# Patient Record
Sex: Female | Born: 1950 | Race: White | Hispanic: No | Marital: Married | Smoking: Former smoker
Health system: Southern US, Community
[De-identification: ages and names within clinical notes are randomized; demographics above are authoritative.]

## PROBLEM LIST (undated history)

## (undated) DIAGNOSIS — I340 Nonrheumatic mitral (valve) insufficiency: Secondary | ICD-10-CM

## (undated) DIAGNOSIS — Z5189 Encounter for other specified aftercare: Secondary | ICD-10-CM

## (undated) DIAGNOSIS — C801 Malignant (primary) neoplasm, unspecified: Secondary | ICD-10-CM

## (undated) DIAGNOSIS — F419 Anxiety disorder, unspecified: Secondary | ICD-10-CM

## (undated) DIAGNOSIS — I071 Rheumatic tricuspid insufficiency: Secondary | ICD-10-CM

## (undated) DIAGNOSIS — E785 Hyperlipidemia, unspecified: Secondary | ICD-10-CM

## (undated) DIAGNOSIS — F32A Depression, unspecified: Secondary | ICD-10-CM

## (undated) DIAGNOSIS — K219 Gastro-esophageal reflux disease without esophagitis: Secondary | ICD-10-CM

## (undated) DIAGNOSIS — E079 Disorder of thyroid, unspecified: Secondary | ICD-10-CM

## (undated) DIAGNOSIS — M1990 Unspecified osteoarthritis, unspecified site: Secondary | ICD-10-CM

## (undated) DIAGNOSIS — F329 Major depressive disorder, single episode, unspecified: Secondary | ICD-10-CM

## (undated) DIAGNOSIS — I10 Essential (primary) hypertension: Secondary | ICD-10-CM

## (undated) HISTORY — DX: Essential (primary) hypertension: I10

## (undated) HISTORY — PX: GALLBLADDER SURGERY: SHX652

## (undated) HISTORY — DX: Disorder of thyroid, unspecified: E07.9

## (undated) HISTORY — PX: CHOLECYSTECTOMY: SHX55

## (undated) HISTORY — PX: TIBIA FRACTURE SURGERY: SHX806

## (undated) HISTORY — DX: Unspecified osteoarthritis, unspecified site: M19.90

## (undated) HISTORY — PX: THROAT SURGERY: SHX803

## (undated) HISTORY — DX: Encounter for other specified aftercare: Z51.89

## (undated) HISTORY — PX: FRACTURE SURGERY: SHX138

## (undated) HISTORY — DX: Anxiety disorder, unspecified: F41.9

## (undated) HISTORY — PX: WRIST SURGERY: SHX841

## (undated) HISTORY — PX: SMALL INTESTINE SURGERY: SHX150

## (undated) HISTORY — DX: Major depressive disorder, single episode, unspecified: F32.9

## (undated) HISTORY — DX: Hyperlipidemia, unspecified: E78.5

## (undated) HISTORY — DX: Depression, unspecified: F32.A

---

## 2004-11-29 ENCOUNTER — Ambulatory Visit: Payer: Self-pay

## 2005-08-16 ENCOUNTER — Ambulatory Visit: Payer: Self-pay | Admitting: Family Medicine

## 2007-02-14 ENCOUNTER — Ambulatory Visit: Payer: Self-pay | Admitting: Family Medicine

## 2007-10-11 ENCOUNTER — Ambulatory Visit: Payer: Self-pay | Admitting: Family Medicine

## 2007-10-27 ENCOUNTER — Ambulatory Visit: Payer: Self-pay | Admitting: Internal Medicine

## 2010-01-04 ENCOUNTER — Ambulatory Visit: Payer: Self-pay | Admitting: Family Medicine

## 2012-04-26 ENCOUNTER — Ambulatory Visit: Payer: Self-pay | Admitting: Family Medicine

## 2012-07-21 ENCOUNTER — Ambulatory Visit: Payer: Self-pay | Admitting: Internal Medicine

## 2012-12-27 ENCOUNTER — Ambulatory Visit: Payer: Self-pay | Admitting: Family Medicine

## 2013-05-08 ENCOUNTER — Ambulatory Visit: Payer: Self-pay | Admitting: Family Medicine

## 2014-10-12 HISTORY — PX: COLONOSCOPY: SHX174

## 2014-10-23 ENCOUNTER — Ambulatory Visit: Payer: Self-pay | Admitting: Physician Assistant

## 2014-10-23 LAB — RAPID STREP-A WITH REFLX: MICRO TEXT REPORT: POSITIVE

## 2014-12-22 DIAGNOSIS — R000 Tachycardia, unspecified: Secondary | ICD-10-CM | POA: Insufficient documentation

## 2014-12-22 DIAGNOSIS — E669 Obesity, unspecified: Secondary | ICD-10-CM | POA: Insufficient documentation

## 2014-12-22 DIAGNOSIS — R5381 Other malaise: Secondary | ICD-10-CM | POA: Insufficient documentation

## 2014-12-22 DIAGNOSIS — E039 Hypothyroidism, unspecified: Secondary | ICD-10-CM | POA: Insufficient documentation

## 2014-12-22 DIAGNOSIS — E7849 Other hyperlipidemia: Secondary | ICD-10-CM | POA: Insufficient documentation

## 2014-12-22 DIAGNOSIS — K219 Gastro-esophageal reflux disease without esophagitis: Secondary | ICD-10-CM | POA: Insufficient documentation

## 2014-12-23 DIAGNOSIS — E782 Mixed hyperlipidemia: Secondary | ICD-10-CM | POA: Insufficient documentation

## 2014-12-23 DIAGNOSIS — I471 Supraventricular tachycardia: Secondary | ICD-10-CM | POA: Insufficient documentation

## 2015-02-18 ENCOUNTER — Ambulatory Visit: Payer: Self-pay | Admitting: Family Medicine

## 2015-03-06 ENCOUNTER — Ambulatory Visit: Payer: Self-pay | Admitting: Gastroenterology

## 2015-06-06 ENCOUNTER — Other Ambulatory Visit: Payer: Self-pay | Admitting: Family Medicine

## 2015-06-06 DIAGNOSIS — F32A Depression, unspecified: Secondary | ICD-10-CM

## 2015-06-06 DIAGNOSIS — E785 Hyperlipidemia, unspecified: Secondary | ICD-10-CM

## 2015-06-06 DIAGNOSIS — F329 Major depressive disorder, single episode, unspecified: Secondary | ICD-10-CM

## 2015-08-03 ENCOUNTER — Other Ambulatory Visit: Payer: Self-pay | Admitting: Family Medicine

## 2015-08-08 ENCOUNTER — Other Ambulatory Visit: Payer: Self-pay | Admitting: Family Medicine

## 2015-08-08 DIAGNOSIS — E039 Hypothyroidism, unspecified: Secondary | ICD-10-CM

## 2015-09-04 ENCOUNTER — Other Ambulatory Visit: Payer: Self-pay

## 2015-12-30 ENCOUNTER — Ambulatory Visit (INDEPENDENT_AMBULATORY_CARE_PROVIDER_SITE_OTHER): Payer: PPO | Admitting: Family Medicine

## 2015-12-30 ENCOUNTER — Encounter: Payer: Self-pay | Admitting: Family Medicine

## 2015-12-30 VITALS — BP 138/80 | HR 80 | Ht 66.0 in | Wt 226.0 lb

## 2015-12-30 DIAGNOSIS — K219 Gastro-esophageal reflux disease without esophagitis: Secondary | ICD-10-CM | POA: Diagnosis not present

## 2015-12-30 DIAGNOSIS — E784 Other hyperlipidemia: Secondary | ICD-10-CM | POA: Diagnosis not present

## 2015-12-30 DIAGNOSIS — Z23 Encounter for immunization: Secondary | ICD-10-CM | POA: Diagnosis not present

## 2015-12-30 DIAGNOSIS — E039 Hypothyroidism, unspecified: Secondary | ICD-10-CM

## 2015-12-30 DIAGNOSIS — E669 Obesity, unspecified: Secondary | ICD-10-CM

## 2015-12-30 DIAGNOSIS — F32A Depression, unspecified: Secondary | ICD-10-CM

## 2015-12-30 DIAGNOSIS — E7849 Other hyperlipidemia: Secondary | ICD-10-CM

## 2015-12-30 DIAGNOSIS — F419 Anxiety disorder, unspecified: Secondary | ICD-10-CM | POA: Diagnosis not present

## 2015-12-30 DIAGNOSIS — F329 Major depressive disorder, single episode, unspecified: Secondary | ICD-10-CM

## 2015-12-30 MED ORDER — ALPRAZOLAM 0.25 MG PO TABS: 0.2500 mg | ORAL_TABLET | ORAL | Status: DC | PRN

## 2015-12-30 MED ORDER — RANITIDINE HCL 75 MG PO TABS: 75.0000 mg | ORAL_TABLET | ORAL | Status: DC | PRN

## 2015-12-30 MED ORDER — LEVOTHYROXINE SODIUM 50 MCG PO TABS: 50.0000 ug | ORAL_TABLET | Freq: Every day | ORAL | Status: DC

## 2015-12-30 MED ORDER — SERTRALINE HCL 50 MG PO TABS: 50.0000 mg | ORAL_TABLET | Freq: Every day | ORAL | Status: DC

## 2015-12-30 NOTE — Progress Notes (Signed)
Name: Brenda Campbell   MRN: [id number]    DOB: 05/30/1951   Date:12/30/2015       Progress Note  Subjective  Chief Complaint  Chief Complaint  Patient presents with  . Hypothyroidism  . Hyperlipidemia  . Depression  . Anxiety    uses Alprazolam as needed    Hyperlipidemia This is a chronic problem. The current episode started more than 1 year ago. The problem is controlled. Recent lipid tests were reviewed and are normal. There are no known factors aggravating her hyperlipidemia. Pertinent negatives include no chest pain, focal sensory loss, focal weakness, leg pain, myalgias or shortness of breath. She is currently on no antihyperlipidemic treatment. The current treatment provides no improvement of lipids. There are no compliance problems.  Risk factors for coronary artery disease include dyslipidemia and obesity.  Depression        This is a recurrent problem.  The current episode started more than 1 year ago.   The onset quality is gradual.   The problem occurs intermittently.  The problem has been waxing and waning since onset.  Associated symptoms include no decreased concentration, no fatigue, no helplessness, no hopelessness, does not have insomnia, not irritable, no restlessness, no decreased interest, no appetite change, no body aches, no myalgias, no headaches, no indigestion, not sad and no suicidal ideas.     The symptoms are aggravated by nothing.  Past treatments include SSRIs - Selective serotonin reuptake inhibitors.  Compliance with treatment is good.  Previous treatment provided no relief relief.  Past medical history includes thyroid problem and anxiety.     Pertinent negatives include no chronic fatigue syndrome, no terminal illness and no eating disorder. Anxiety Presents for follow-up visit. The problem has been waxing and waning. Patient reports no chest pain, decreased concentration, dizziness, excessive worry, insomnia, irritability, nausea, nervous/anxious  behavior, palpitations, restlessness, shortness of breath or suicidal ideas.    Thyroid Problem Presents for follow-up visit. Symptoms include hair loss. Patient reports no anxiety, constipation, diarrhea, fatigue, heat intolerance, palpitations or weight loss. The symptoms have been stable. Past treatments include levothyroxine. The treatment provided mild relief. Her past medical history is significant for hyperlipidemia. There is no history of atrial fibrillation or dementia.    No problem-specific assessment & plan notes found for this encounter.   Past Medical History  Diagnosis Date  . Thyroid disease   . Hypertension   . Hyperlipidemia   . Depression   . Anxiety     Past Surgical History  Procedure Laterality Date  . Gallbladder surgery    . 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 Cesarean section      x 1  . Wrist surgery Right     arthritis  . Tibia fracture surgery Right   . Colonoscopy  10/12/2014    cleared for 3 years    History reviewed. No pertinent family history.  Social History   Social History  . Marital Status: Married    Spouse Name: N/A  . Number of Children: N/A  . Years of Education: N/A   Occupational History  . Not on file.   Social History Main Topics  . Smoking status: Former Research scientist (life sciences)  . Smokeless tobacco: Not on file  . Alcohol Use: 0.0 oz/week    0 Standard drinks or equivalent per week  . Drug Use: No  . Sexual Activity: Not Currently   Other Topics Concern  . Not on file  Social History Narrative  . No narrative on file    No Known Allergies   Review of Systems  Constitutional: Negative for fever, chills, weight loss, malaise/fatigue, appetite change, irritability and fatigue.  HENT: Negative for ear discharge, ear pain and sore throat.   Eyes: Negative for blurred vision.  Respiratory: Negative for cough, sputum production, shortness of breath and wheezing.   Cardiovascular: Negative for chest pain, palpitations  and leg swelling.  Gastrointestinal: Negative for heartburn, nausea, abdominal pain, diarrhea, constipation, blood in stool and melena.  Genitourinary: Negative for dysuria, urgency, frequency and hematuria.  Musculoskeletal: Negative for myalgias, back pain, joint pain and neck pain.  Skin: Negative for rash.  Neurological: Negative for dizziness, tingling, sensory change, focal weakness and headaches.  Endo/Heme/Allergies: Negative for environmental allergies, heat intolerance and polydipsia. Does not bruise/bleed easily.  Psychiatric/Behavioral: Positive for depression. Negative for suicidal ideas and decreased concentration. The patient is not nervous/anxious and does not have insomnia.      Objective  Filed Vitals:   12/30/15 1011  BP: 138/80  Pulse: 80  Height: 5\' 6"  (1.676 m)  Weight: 226 lb (102.513 kg)    Physical Exam  Constitutional: She is well-developed, well-nourished, and in no distress. She is not irritable. No distress.  HENT:  Head: Normocephalic and atraumatic.  Right Ear: External ear normal.  Left Ear: External ear normal.  Nose: Nose normal.  Mouth/Throat: Oropharynx is clear and moist.  Eyes: Conjunctivae and EOM are normal. Pupils are equal, round, and reactive to light. Right eye exhibits no discharge. Left eye exhibits no discharge.  Neck: Normal range of motion. Neck supple. No JVD present. No thyromegaly present.  Cardiovascular: Normal rate, regular rhythm, normal heart sounds and intact distal pulses.  Exam reveals no gallop and no friction rub.   No murmur heard. Pulmonary/Chest: Effort normal and breath sounds normal.  Abdominal: Soft. Bowel sounds are normal. She exhibits no mass. There is no tenderness. There is no guarding.  Musculoskeletal: Normal range of motion. She exhibits no edema.  Lymphadenopathy:    She has no cervical adenopathy.  Neurological: She is alert.  Skin: Skin is warm and dry. She is not diaphoretic.  Psychiatric: Mood and  affect normal.  Nursing note and vitals reviewed.     Assessment & Plan  Problem List Items Addressed This Visit      Endocrine   Adult hypothyroidism - Primary   Relevant Medications   levothyroxine (SYNTHROID, LEVOTHROID) 50 MCG tablet   Other Relevant Orders   TSH     Other   Familial multiple lipoprotein-type hyperlipidemia   Relevant Orders   Lipid Profile   Adiposity   Relevant Orders   Renal Function Panel    Other Visit Diagnoses    Depression        Relevant Medications    ALPRAZolam (XANAX) 0.25 MG tablet    sertraline (ZOLOFT) 50 MG tablet    Acute anxiety        Relevant Medications    ALPRAZolam (XANAX) 0.25 MG tablet    sertraline (ZOLOFT) 50 MG tablet    Gastroesophageal reflux disease, esophagitis presence not specified        Relevant Medications    ranitidine ([facility] RANITIDINE) 75 MG tablet    Need for pneumococcal vaccination        Relevant Orders    Pneumococcal conjugate vaccine 13-valent (Completed)         Dr. [facility]  12/30/2015    

## 2015-12-31 LAB — RENAL FUNCTION PANEL
Albumin: 4.2 g/dL (ref 3.6–4.8)
BUN / CREAT RATIO: 21 (ref 11–26)
BUN: 15 mg/dL (ref 8–27)
CALCIUM: 9.2 mg/dL (ref 8.7–10.3)
CHLORIDE: 100 mmol/L (ref 96–106)
CO2: 26 mmol/L (ref 18–29)
Creatinine, Ser: 0.73 mg/dL (ref 0.57–1.00)
GFR calc Af Amer: 100 mL/min/{1.73_m2} (ref >59)
GFR calc non Af Amer: 87 mL/min/{1.73_m2} (ref >59)
GLUCOSE: 99 mg/dL (ref 65–99)
POTASSIUM: 4.7 mmol/L (ref 3.5–5.2)
Phosphorus: 3.3 mg/dL (ref 2.5–4.5)
SODIUM: 140 mmol/L (ref 134–144)

## 2015-12-31 LAB — LIPID PANEL
CHOL/HDL RATIO: 3.8 ratio (ref 0.0–4.4)
CHOLESTEROL TOTAL: 197 mg/dL (ref 100–199)
HDL: 52 mg/dL (ref >39)
LDL CALC: 120 mg/dL — AB (ref 0–99)
Triglycerides: 126 mg/dL (ref 0–149)
VLDL Cholesterol Cal: 25 mg/dL (ref 5–40)

## 2015-12-31 LAB — TSH: TSH: 1.89 u[IU]/mL (ref 0.450–4.500)

## 2016-04-16 DIAGNOSIS — N390 Urinary tract infection, site not specified: Secondary | ICD-10-CM | POA: Diagnosis not present

## 2016-06-27 ENCOUNTER — Other Ambulatory Visit: Payer: Self-pay | Admitting: Family Medicine

## 2016-08-02 ENCOUNTER — Ambulatory Visit: Payer: PPO | Admitting: Family Medicine

## 2016-08-02 ENCOUNTER — Other Ambulatory Visit: Payer: Self-pay

## 2016-08-02 MED ORDER — SERTRALINE HCL 50 MG PO TABS: 50.0000 mg | ORAL_TABLET | Freq: Every day | ORAL | 0 refills | Status: DC

## 2016-08-03 ENCOUNTER — Encounter (INDEPENDENT_AMBULATORY_CARE_PROVIDER_SITE_OTHER): Payer: Self-pay

## 2016-08-03 ENCOUNTER — Encounter: Payer: Self-pay | Admitting: Family Medicine

## 2016-08-03 ENCOUNTER — Ambulatory Visit (INDEPENDENT_AMBULATORY_CARE_PROVIDER_SITE_OTHER): Payer: PPO | Admitting: Family Medicine

## 2016-08-03 VITALS — BP 100/60 | HR 70 | Ht 66.0 in | Wt 201.0 lb

## 2016-08-03 DIAGNOSIS — E784 Other hyperlipidemia: Secondary | ICD-10-CM | POA: Diagnosis not present

## 2016-08-03 DIAGNOSIS — F410 Panic disorder [episodic paroxysmal anxiety] without agoraphobia: Secondary | ICD-10-CM | POA: Diagnosis not present

## 2016-08-03 DIAGNOSIS — F329 Major depressive disorder, single episode, unspecified: Secondary | ICD-10-CM | POA: Diagnosis not present

## 2016-08-03 DIAGNOSIS — F32A Depression, unspecified: Secondary | ICD-10-CM

## 2016-08-03 DIAGNOSIS — E039 Hypothyroidism, unspecified: Secondary | ICD-10-CM | POA: Diagnosis not present

## 2016-08-03 DIAGNOSIS — H04123 Dry eye syndrome of bilateral lacrimal glands: Secondary | ICD-10-CM

## 2016-08-03 DIAGNOSIS — E7849 Other hyperlipidemia: Secondary | ICD-10-CM

## 2016-08-03 MED ORDER — LEVOTHYROXINE SODIUM 50 MCG PO TABS: 50.0000 ug | ORAL_TABLET | Freq: Every day | ORAL | 6 refills | Status: DC

## 2016-08-03 MED ORDER — SERTRALINE HCL 50 MG PO TABS: 50.0000 mg | ORAL_TABLET | Freq: Every day | ORAL | 6 refills | Status: DC

## 2016-08-03 MED ORDER — ALPRAZOLAM 0.25 MG PO TABS: 0.2500 mg | ORAL_TABLET | ORAL | 0 refills | Status: DC | PRN

## 2016-08-03 NOTE — Progress Notes (Signed)
Name: Brenda Campbell   MRN: [id number]    DOB: 1951/02/07   Date:08/03/2016       Progress Note  Subjective  Chief Complaint  Chief Complaint  Patient presents with  . Hypothyroidism  . Depression  . Anxiety    Depression       The patient presents with depression.  This is a chronic problem.  The current episode started more than 1 year ago.   The onset quality is gradual.   The problem occurs intermittently.  The problem has been gradually improving since onset.  Associated symptoms include no decreased concentration, no fatigue, no helplessness, no hopelessness, does not have insomnia, not irritable, no restlessness, no decreased interest, no appetite change, no body aches, no myalgias, no headaches, no indigestion, not sad and no suicidal ideas.     The symptoms are aggravated by nothing.  Past treatments include SSRIs - Selective serotonin reuptake inhibitors.  Compliance with treatment is good.  Previous treatment provided mild relief.  Past medical history includes thyroid problem, anxiety and depression.     Pertinent negatives include no chronic fatigue syndrome, no chronic pain, no fibromyalgia and no hypothyroidism. Anxiety  Presents for follow-up visit. Symptoms include depressed mood, excessive worry and nervous/anxious behavior. Patient reports no chest pain, compulsions, confusion, decreased concentration, dizziness, dry mouth, feeling of choking, hyperventilation, impotence, insomnia, irritability, malaise, muscle tension, nausea, obsessions, palpitations, panic, restlessness, shortness of breath or suicidal ideas. Symptoms occur occasionally. The severity of symptoms is mild.   Her past medical history is significant for depression.  Thyroid Problem  Presents for follow-up visit. Symptoms include anxiety, depressed mood, dry skin, heat intolerance and weight loss. Patient reports no cold intolerance, constipation, diaphoresis, diarrhea, fatigue or palpitations. The  symptoms have been stable.    No problem-specific Assessment & Plan notes found for this encounter.   Past Medical History:  Diagnosis Date  . Anxiety   . Depression   . Hyperlipidemia   . Hypertension   . Thyroid disease     Past Surgical History:  Procedure Laterality Date  . CESAREAN SECTION     x 1  . COLONOSCOPY  10/12/2014   cleared for 3 years  . GALLBLADDER SURGERY    . 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 TIBIA FRACTURE SURGERY Right   . WRIST SURGERY Right    arthritis    History reviewed. No pertinent family history.  Social History   Social History  . Marital status: Married    Spouse name: N/A  . Number of children: N/A  . Years of education: N/A   Occupational History  . Not on file.   Social History Main Topics  . Smoking status: Former Research scientist (life sciences)  . Smokeless tobacco: Never Used  . Alcohol use 0.0 oz/week  . Drug use: No  . Sexual activity: Not Currently   Other Topics Concern  . Not on file   Social History Narrative  . No narrative on file    No Known Allergies   Review of Systems  Constitutional: Positive for weight loss. Negative for appetite change, chills, diaphoresis, fatigue, fever, irritability and malaise/fatigue.  HENT: Negative for ear discharge, ear pain and sore throat.   Eyes: Negative for blurred vision.  Respiratory: Negative for cough, sputum production, shortness of breath and wheezing.   Cardiovascular: Negative for chest pain, palpitations and leg swelling.  Gastrointestinal: Negative for abdominal pain, blood in stool, constipation, diarrhea, heartburn, melena and nausea.  Genitourinary: Negative for dysuria, frequency, hematuria, impotence and urgency.  Musculoskeletal: Negative for back pain, joint pain, myalgias and neck pain.  Skin: Negative for rash.  Neurological: Negative for dizziness, tingling, sensory change, focal weakness and headaches.  Endo/Heme/Allergies: Positive for heat intolerance.  Negative for environmental allergies, cold intolerance and polydipsia. Does not bruise/bleed easily.  Psychiatric/Behavioral: Positive for depression. Negative for confusion, decreased concentration and suicidal ideas. The patient is nervous/anxious. The patient does not have insomnia.      Objective  Vitals:   08/03/16 0911  BP: 100/60  Pulse: 70  Weight: 201 lb (91.2 kg)  Height: 5\' 6"  (1.676 m)    Physical Exam  Constitutional: She is well-developed, well-nourished, and in no distress. She is not irritable. No distress.  HENT:  Head: Normocephalic and atraumatic.  Right Ear: External ear normal.  Left Ear: External ear normal.  Nose: Nose normal.  Mouth/Throat: Oropharynx is clear and moist.  Eyes: Conjunctivae and EOM are normal. Pupils are equal, round, and reactive to light. Right eye exhibits no discharge. Left eye exhibits no discharge.  Fundoscopic exam:      The right eye shows no arteriolar narrowing and no AV nicking.       The left eye shows no arteriolar narrowing and no AV nicking.  Neck: Normal range of motion. Neck supple. No JVD present. No thyromegaly present.  Cardiovascular: Normal rate, regular rhythm, normal heart sounds and intact distal pulses.  Exam reveals no gallop and no friction rub.   No murmur heard. Pulmonary/Chest: Effort normal and breath sounds normal.  Abdominal: Soft. Bowel sounds are normal. She exhibits no mass. There is no tenderness. There is no guarding.  Musculoskeletal: Normal range of motion. She exhibits no edema.  Lymphadenopathy:    She has no cervical adenopathy.  Neurological: She is alert. She has normal reflexes.  Skin: Skin is warm and dry. She is not diaphoretic.  Psychiatric: Mood and affect normal.  Nursing note and vitals reviewed.     Assessment & Plan  Problem List Items Addressed This Visit      Endocrine   Adult hypothyroidism - Primary   Relevant Medications   levothyroxine (SYNTHROID, LEVOTHROID) 50 MCG  tablet   Other Relevant Orders   Renal Function Panel   TSH     Other   Familial multiple lipoprotein-type hyperlipidemia   Relevant Orders   Lipid Profile    Other Visit Diagnoses    Depression       Relevant Medications   sertraline (ZOLOFT) 50 MG tablet   ALPRAZolam (XANAX) 0.25 MG tablet   Panic disorder       Relevant Medications   sertraline (ZOLOFT) 50 MG tablet   ALPRAZolam (XANAX) 0.25 MG tablet   Dry eye syndrome, bilateral       Relevant Orders   Ambulatory referral to Ophthalmology        Dr. Otilio Miu [facility]  08/03/16

## 2016-08-04 LAB — LIPID PANEL
CHOLESTEROL TOTAL: 229 mg/dL — AB (ref 100–199)
Chol/HDL Ratio: 3.9 ratio units (ref 0.0–4.4)
HDL: 59 mg/dL (ref >39)
LDL Calculated: 138 mg/dL — ABNORMAL HIGH (ref 0–99)
TRIGLYCERIDES: 159 mg/dL — AB (ref 0–149)
VLDL CHOLESTEROL CAL: 32 mg/dL (ref 5–40)

## 2016-08-04 LAB — RENAL FUNCTION PANEL
Albumin: 4.2 g/dL (ref 3.6–4.8)
BUN / CREAT RATIO: 21 (ref 12–28)
BUN: 14 mg/dL (ref 8–27)
CHLORIDE: 100 mmol/L (ref 96–106)
CO2: 26 mmol/L (ref 18–29)
Calcium: 8.7 mg/dL (ref 8.7–10.3)
Creatinine, Ser: 0.67 mg/dL (ref 0.57–1.00)
GFR, EST AFRICAN AMERICAN: 107 mL/min/{1.73_m2} (ref >59)
GFR, EST NON AFRICAN AMERICAN: 93 mL/min/{1.73_m2} (ref >59)
GLUCOSE: 97 mg/dL (ref 65–99)
POTASSIUM: 4.3 mmol/L (ref 3.5–5.2)
Phosphorus: 3.8 mg/dL (ref 2.5–4.5)
SODIUM: 141 mmol/L (ref 134–144)

## 2016-08-04 LAB — TSH: TSH: 3.7 u[IU]/mL (ref 0.450–4.500)

## 2017-02-20 ENCOUNTER — Other Ambulatory Visit: Payer: Self-pay | Admitting: Family Medicine

## 2017-02-20 DIAGNOSIS — E039 Hypothyroidism, unspecified: Secondary | ICD-10-CM

## 2017-03-13 ENCOUNTER — Ambulatory Visit (INDEPENDENT_AMBULATORY_CARE_PROVIDER_SITE_OTHER): Payer: PPO | Admitting: Family Medicine

## 2017-03-13 DIAGNOSIS — E039 Hypothyroidism, unspecified: Secondary | ICD-10-CM

## 2017-03-13 DIAGNOSIS — F410 Panic disorder [episodic paroxysmal anxiety] without agoraphobia: Secondary | ICD-10-CM | POA: Insufficient documentation

## 2017-03-13 DIAGNOSIS — K219 Gastro-esophageal reflux disease without esophagitis: Secondary | ICD-10-CM | POA: Diagnosis not present

## 2017-03-13 DIAGNOSIS — F330 Major depressive disorder, recurrent, mild: Secondary | ICD-10-CM | POA: Insufficient documentation

## 2017-03-13 MED ORDER — LEVOTHYROXINE SODIUM 50 MCG PO TABS: 50.0000 ug | ORAL_TABLET | Freq: Every day | ORAL | 11 refills | Status: DC

## 2017-03-13 MED ORDER — SERTRALINE HCL 50 MG PO TABS: 50.0000 mg | ORAL_TABLET | Freq: Every day | ORAL | 11 refills | Status: DC

## 2017-03-13 MED ORDER — RANITIDINE HCL 75 MG PO TABS: 75.0000 mg | ORAL_TABLET | ORAL | 3 refills | Status: DC | PRN

## 2017-03-13 MED ORDER — ALPRAZOLAM 0.25 MG PO TABS: 0.2500 mg | ORAL_TABLET | ORAL | 0 refills | Status: DC | PRN

## 2017-03-13 NOTE — Progress Notes (Signed)
Name: Brenda Campbell   MRN: [id number]    DOB: 10/13/1951   Date:03/13/2017       Progress Note  Subjective  Chief Complaint  Chief Complaint  Patient presents with  . Hypothyroidism  . Anxiety  . Depression    Anxiety  Presents for follow-up visit. Symptoms include depressed mood and nervous/anxious behavior. Patient reports no chest pain, dizziness, excessive worry, insomnia, irritability, nausea, palpitations, panic, shortness of breath or suicidal ideas. Symptoms occur occasionally. The severity of symptoms is moderate. The quality of sleep is good.    Depression         This is a chronic problem.  The onset quality is gradual.   The problem occurs daily.  The problem has been gradually improving since onset.  Associated symptoms include myalgias.  Associated symptoms include no fatigue, no helplessness, no hopelessness, does not have insomnia, not irritable, no appetite change, no headaches, not sad and no suicidal ideas.  Past treatments include SSRIs - Selective serotonin reuptake inhibitors.  Previous treatment provided mild relief.  Past medical history includes chronic pain, hypothyroidism, thyroid problem and anxiety.   Hyperlipidemia  This is a chronic problem. The current episode started more than 1 year ago. The problem is controlled. Recent lipid tests were reviewed and are low. Exacerbating diseases include hypothyroidism. She has no history of diabetes. There are no known factors aggravating her hyperlipidemia. Associated symptoms include leg pain and myalgias. Pertinent negatives include no chest pain, focal weakness or shortness of breath. Current antihyperlipidemic treatment includes diet change (omega 3). The current treatment provides mild improvement of lipids. There are no compliance problems.   Thyroid Problem  Presents for follow-up visit. Symptoms include anxiety, depressed mood and weight gain. Patient reports no cold intolerance, constipation, diaphoresis,  diarrhea, fatigue, hair loss, palpitations or weight loss. The symptoms have been stable. Her past medical history is significant for hyperlipidemia. There is no history of diabetes.  Gastroesophageal Reflux  She reports no abdominal pain, no chest pain, no coughing, no heartburn, no nausea, no sore throat or no wheezing. This is a chronic problem. The current episode started more than 1 year ago. The problem has been gradually improving. The symptoms are aggravated by certain foods. Pertinent negatives include no fatigue, melena or weight loss. Risk factors include NSAIDs. She has tried a histamine-2 antagonist for the symptoms. The treatment provided moderate relief.    No problem-specific Assessment & Plan notes found for this encounter.   Past Medical History:  Diagnosis Date  . Anxiety   . Depression   . Hyperlipidemia   . Hypertension   . Thyroid disease     Past Surgical History:  Procedure Laterality Date  . CESAREAN SECTION     x 1  . COLONOSCOPY  10/12/2014   cleared for 3 years  . GALLBLADDER SURGERY    . 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 TIBIA FRACTURE SURGERY Right   . WRIST SURGERY Right    arthritis    No family history on file.  Social History   Social History  . Marital status: Married    Spouse name: N/A  . Number of children: N/A  . Years of education: N/A   Occupational History  . Not on file.   Social History Main Topics  . Smoking status: Former Research scientist (life sciences)  . Smokeless tobacco: Never Used  . Alcohol use 0.0 oz/week  . Drug use: No  . Sexual activity: Not Currently  Other Topics Concern  . Not on file   Social History Narrative  . No narrative on file    No Known Allergies  Outpatient Medications Prior to Visit  Medication Sig Dispense Refill  . ALPRAZolam (XANAX) 0.25 MG tablet Take 1 tablet (0.25 mg total) by mouth as needed. 30 tablet 0  . levothyroxine (SYNTHROID, LEVOTHROID) 50 MCG tablet TAKE ONE TABLET BY MOUTH ONCE  DAILY 30 tablet 0  . ranitidine ([facility] RANITIDINE) 75 MG tablet Take 1 tablet (75 mg total) by mouth as needed. otc 90 tablet 3  . sertraline (ZOLOFT) 50 MG tablet Take 1 tablet (50 mg total) by mouth daily. 30 tablet 6  . atorvastatin (LIPITOR) 10 MG tablet TAKE 1 TABLET BY MOUTH EVERY DAY (Patient not taking: Reported on 12/30/2015) 90 tablet 0   No facility-administered medications prior to visit.     Review of Systems  Constitutional: Positive for weight gain. Negative for appetite change, chills, diaphoresis, fatigue, fever, irritability, malaise/fatigue and weight loss.  HENT: Negative for ear discharge, ear pain and sore throat.   Eyes: Negative for blurred vision.  Respiratory: Negative for cough, sputum production, shortness of breath and wheezing.   Cardiovascular: Negative for chest pain, palpitations and leg swelling.  Gastrointestinal: Negative for abdominal pain, blood in stool, constipation, diarrhea, heartburn, melena and nausea.  Genitourinary: Negative for dysuria, frequency, hematuria and urgency.  Musculoskeletal: Positive for myalgias. Negative for back pain, joint pain and neck pain.  Skin: Negative for rash.  Neurological: Negative for dizziness, tingling, sensory change, focal weakness and headaches.  Endo/Heme/Allergies: Negative for environmental allergies, cold intolerance and polydipsia. Does not bruise/bleed easily.  Psychiatric/Behavioral: Positive for depression. Negative for suicidal ideas. The patient is nervous/anxious. The patient does not have insomnia.      Objective  Vitals:   03/13/17 0937  BP: 124/62  Pulse: 78  Weight: 201 lb (91.2 kg)  Height: 5\' 6"  (1.676 m)    Physical Exam  Constitutional: She is well-developed, well-nourished, and in no distress. She is not irritable. No distress.  HENT:  Head: Normocephalic and atraumatic.  Right Ear: External ear normal.  Left Ear: External ear normal.  Nose: Nose normal.  Mouth/Throat: Oropharynx  is clear and moist.  Eyes: Conjunctivae and EOM are normal. Pupils are equal, round, and reactive to light. Right eye exhibits no discharge. Left eye exhibits no discharge.  Neck: Normal range of motion. Neck supple. No JVD present. No thyromegaly present.  Cardiovascular: Normal rate, regular rhythm, normal heart sounds and intact distal pulses.  Exam reveals no gallop and no friction rub.   No murmur heard. Pulmonary/Chest: Effort normal and breath sounds normal. She has no wheezes. She has no rales.  Abdominal: Soft. Bowel sounds are normal. She exhibits no mass. There is no tenderness. There is no guarding.  Musculoskeletal: Normal range of motion. She exhibits no edema.  Lymphadenopathy:    She has no cervical adenopathy.  Neurological: She is alert. She has normal reflexes.  Skin: Skin is warm and dry. She is not diaphoretic.  Psychiatric: Mood and affect normal.  Nursing note and vitals reviewed.     Assessment & Plan  Problem List Items Addressed This Visit      Digestive   Acid reflux   Relevant Medications   ranitidine ([facility] RANITIDINE) 75 MG tablet     Endocrine   Hypothyroidism   Relevant Medications   levothyroxine (SYNTHROID, LEVOTHROID) 50 MCG tablet     Other   Panic  disorder   Relevant Medications   ALPRAZolam (XANAX) 0.25 MG tablet   sertraline (ZOLOFT) 50 MG tablet   Mild episode of recurrent major depressive disorder (HCC)   Relevant Medications   ALPRAZolam (XANAX) 0.25 MG tablet   sertraline (ZOLOFT) 50 MG tablet      Meds ordered this encounter  Medications  . ALPRAZolam (XANAX) 0.25 MG tablet    Sig: Take 1 tablet (0.25 mg total) by mouth as needed.    Dispense:  30 tablet    Refill:  0  . levothyroxine (SYNTHROID, LEVOTHROID) 50 MCG tablet    Sig: Take 1 tablet (50 mcg total) by mouth daily.    Dispense:  30 tablet    Refill:  11    Needs appt for meds  . ranitidine ([facility] RANITIDINE) 75 MG tablet    Sig: Take 1 tablet (75 mg total) by  mouth as needed. otc    Dispense:  90 tablet    Refill:  3  . sertraline (ZOLOFT) 50 MG tablet    Sig: Take 1 tablet (50 mg total) by mouth daily.    Dispense:  30 tablet    Refill:  11      Dr. Deanna Jones [facility]  03/13/17

## 2017-03-15 ENCOUNTER — Encounter
Admission: EM | Disposition: A | Discharge status: Home / Self Care | Payer: Self-pay | Source: Home / Self Care | Attending: Emergency Medicine

## 2017-03-15 ENCOUNTER — Encounter: Payer: Self-pay | Admitting: *Deleted

## 2017-03-15 ENCOUNTER — Observation Stay: Payer: PPO | Admitting: Anesthesiology

## 2017-03-15 ENCOUNTER — Observation Stay
Admission: EM | Admit: 2017-03-15 | Discharge: 2017-03-17 | Disposition: A | Discharge status: Home / Self Care | Payer: PPO | Attending: Internal Medicine | Admitting: Internal Medicine

## 2017-03-15 DIAGNOSIS — K625 Hemorrhage of anus and rectum: Secondary | ICD-10-CM | POA: Diagnosis not present

## 2017-03-15 DIAGNOSIS — Z79899 Other long term (current) drug therapy: Secondary | ICD-10-CM | POA: Diagnosis not present

## 2017-03-15 DIAGNOSIS — D649 Anemia, unspecified: Secondary | ICD-10-CM | POA: Diagnosis not present

## 2017-03-15 DIAGNOSIS — E876 Hypokalemia: Secondary | ICD-10-CM | POA: Insufficient documentation

## 2017-03-15 DIAGNOSIS — K5730 Diverticulosis of large intestine without perforation or abscess without bleeding: Secondary | ICD-10-CM | POA: Insufficient documentation

## 2017-03-15 DIAGNOSIS — Z9884 Bariatric surgery status: Secondary | ICD-10-CM | POA: Diagnosis not present

## 2017-03-15 DIAGNOSIS — K921 Melena: Secondary | ICD-10-CM | POA: Diagnosis not present

## 2017-03-15 DIAGNOSIS — K922 Gastrointestinal hemorrhage, unspecified: Secondary | ICD-10-CM | POA: Diagnosis not present

## 2017-03-15 DIAGNOSIS — Z791 Long term (current) use of non-steroidal anti-inflammatories (NSAID): Secondary | ICD-10-CM | POA: Diagnosis not present

## 2017-03-15 DIAGNOSIS — K3189 Other diseases of stomach and duodenum: Secondary | ICD-10-CM | POA: Diagnosis not present

## 2017-03-15 DIAGNOSIS — I471 Supraventricular tachycardia: Secondary | ICD-10-CM | POA: Diagnosis not present

## 2017-03-15 DIAGNOSIS — D123 Benign neoplasm of transverse colon: Secondary | ICD-10-CM

## 2017-03-15 DIAGNOSIS — I959 Hypotension, unspecified: Secondary | ICD-10-CM | POA: Insufficient documentation

## 2017-03-15 DIAGNOSIS — K2950 Unspecified chronic gastritis without bleeding: Secondary | ICD-10-CM | POA: Insufficient documentation

## 2017-03-15 DIAGNOSIS — K648 Other hemorrhoids: Secondary | ICD-10-CM | POA: Insufficient documentation

## 2017-03-15 DIAGNOSIS — K219 Gastro-esophageal reflux disease without esophagitis: Secondary | ICD-10-CM | POA: Insufficient documentation

## 2017-03-15 DIAGNOSIS — F419 Anxiety disorder, unspecified: Secondary | ICD-10-CM | POA: Insufficient documentation

## 2017-03-15 DIAGNOSIS — K2970 Gastritis, unspecified, without bleeding: Secondary | ICD-10-CM

## 2017-03-15 DIAGNOSIS — D62 Acute posthemorrhagic anemia: Secondary | ICD-10-CM | POA: Insufficient documentation

## 2017-03-15 DIAGNOSIS — K635 Polyp of colon: Secondary | ICD-10-CM | POA: Insufficient documentation

## 2017-03-15 DIAGNOSIS — I10 Essential (primary) hypertension: Secondary | ICD-10-CM | POA: Insufficient documentation

## 2017-03-15 DIAGNOSIS — K2900 Acute gastritis without bleeding: Secondary | ICD-10-CM | POA: Diagnosis not present

## 2017-03-15 DIAGNOSIS — B9681 Helicobacter pylori [H. pylori] as the cause of diseases classified elsewhere: Secondary | ICD-10-CM | POA: Insufficient documentation

## 2017-03-15 DIAGNOSIS — Z87891 Personal history of nicotine dependence: Secondary | ICD-10-CM | POA: Diagnosis not present

## 2017-03-15 HISTORY — DX: Gastro-esophageal reflux disease without esophagitis: K21.9

## 2017-03-15 HISTORY — PX: ESOPHAGOGASTRODUODENOSCOPY (EGD) WITH PROPOFOL: SHX5813

## 2017-03-15 LAB — CBC
HCT: 31.8 % — ABNORMAL LOW (ref 35.0–47.0)
Hemoglobin: 11.2 g/dL — ABNORMAL LOW (ref 12.0–16.0)
MCH: 32.9 pg (ref 26.0–34.0)
MCHC: 35.4 g/dL (ref 32.0–36.0)
MCV: 93.2 fL (ref 80.0–100.0)
Platelets: 312 K/uL (ref 150–440)
RBC: 3.41 MIL/uL — ABNORMAL LOW (ref 3.80–5.20)
RDW: 12.9 % (ref 11.5–14.5)
WBC: 12.1 K/uL — ABNORMAL HIGH (ref 3.6–11.0)

## 2017-03-15 LAB — COMPREHENSIVE METABOLIC PANEL
ALT: 13 U/L — ABNORMAL LOW (ref 14–54)
ANION GAP: 7 (ref 5–15)
AST: 17 U/L (ref 15–41)
Albumin: 3.3 g/dL — ABNORMAL LOW (ref 3.5–5.0)
Alkaline Phosphatase: 54 U/L (ref 38–126)
BILIRUBIN TOTAL: 0.5 mg/dL (ref 0.3–1.2)
BUN: 31 mg/dL — AB (ref 6–20)
CO2: 22 mmol/L (ref 22–32)
Calcium: 8.3 mg/dL — ABNORMAL LOW (ref 8.9–10.3)
Chloride: 106 mmol/L (ref 101–111)
Creatinine, Ser: 0.51 mg/dL (ref 0.44–1.00)
GFR calc Af Amer: >60 mL/min (ref >60)
GFR calc non Af Amer: >60 mL/min (ref >60)
Glucose, Bld: 149 mg/dL — ABNORMAL HIGH (ref 65–99)
POTASSIUM: 4.3 mmol/L (ref 3.5–5.1)
Sodium: 135 mmol/L (ref 135–145)
TOTAL PROTEIN: 6.1 g/dL — AB (ref 6.5–8.1)

## 2017-03-15 LAB — HEMOGLOBIN
HEMOGLOBIN: 8.9 g/dL — AB (ref 12.0–16.0)
Hemoglobin: 9.3 g/dL — ABNORMAL LOW (ref 12.0–16.0)

## 2017-03-15 LAB — MAGNESIUM: Magnesium: 1.9 mg/dL (ref 1.7–2.4)

## 2017-03-15 SURGERY — ESOPHAGOGASTRODUODENOSCOPY (EGD) WITH PROPOFOL
Anesthesia: General

## 2017-03-15 MED ORDER — FENTANYL CITRATE (PF) 100 MCG/2ML IJ SOLN
INTRAMUSCULAR | Status: AC
  Filled 2017-03-15: qty 2

## 2017-03-15 MED ORDER — PANTOPRAZOLE SODIUM 40 MG IV SOLR
80.0000 mg | Freq: Once | INTRAVENOUS | Status: AC
  Administered 2017-03-15: 10:00:00 80 mg via INTRAVENOUS
  Filled 2017-03-15: qty 80

## 2017-03-15 MED ORDER — ACETAMINOPHEN 650 MG RE SUPP: 650.0000 mg | Freq: Four times a day (QID) | RECTAL | Status: DC | PRN

## 2017-03-15 MED ORDER — PEG 3350-KCL-NA BICARB-NACL 420 G PO SOLR
4000.0000 mL | Freq: Once | ORAL | Status: AC
  Administered 2017-03-15: 4000 mL via ORAL
  Filled 2017-03-15: qty 4000

## 2017-03-15 MED ORDER — PROPOFOL 500 MG/50ML IV EMUL
INTRAVENOUS | Status: AC
  Filled 2017-03-15: qty 50

## 2017-03-15 MED ORDER — ALPRAZOLAM 0.5 MG PO TABS: 0.2500 mg | ORAL_TABLET | ORAL | Status: DC | PRN

## 2017-03-15 MED ORDER — PROPOFOL 500 MG/50ML IV EMUL
INTRAVENOUS | Status: DC | PRN
  Administered 2017-03-15: 140 ug/kg/min via INTRAVENOUS

## 2017-03-15 MED ORDER — POTASSIUM CHLORIDE IN NACL 20-0.9 MEQ/L-% IV SOLN
INTRAVENOUS | Status: DC
  Administered 2017-03-15 – 2017-03-17 (×5): via INTRAVENOUS
  Filled 2017-03-15 (×7): qty 1000

## 2017-03-15 MED ORDER — ACETAMINOPHEN 325 MG PO TABS: 650.0000 mg | ORAL_TABLET | Freq: Four times a day (QID) | ORAL | Status: DC | PRN

## 2017-03-15 MED ORDER — ALBUTEROL SULFATE (2.5 MG/3ML) 0.083% IN NEBU: 2.5000 mg | INHALATION_SOLUTION | RESPIRATORY_TRACT | Status: DC | PRN

## 2017-03-15 MED ORDER — SODIUM CHLORIDE 0.9 % IV SOLN
INTRAVENOUS | Status: DC
  Administered 2017-03-15: 1000 mL via INTRAVENOUS

## 2017-03-15 MED ORDER — PANTOPRAZOLE SODIUM 40 MG IV SOLR: 40.0000 mg | Freq: Two times a day (BID) | INTRAVENOUS | Status: DC

## 2017-03-15 MED ORDER — PROPOFOL 10 MG/ML IV BOLUS
INTRAVENOUS | Status: DC | PRN
  Administered 2017-03-15: 70 mg via INTRAVENOUS

## 2017-03-15 MED ORDER — GLYCOPYRROLATE 0.2 MG/ML IJ SOLN
INTRAMUSCULAR | Status: AC
  Filled 2017-03-15: qty 1

## 2017-03-15 MED ORDER — LEVOTHYROXINE SODIUM 50 MCG PO TABS
50.0000 ug | ORAL_TABLET | ORAL | Status: DC
  Administered 2017-03-15 – 2017-03-16 (×2): 50 ug via ORAL
  Filled 2017-03-15 (×3): qty 1

## 2017-03-15 MED ORDER — SODIUM CHLORIDE 0.9 % IV SOLN
8.0000 mg/h | INTRAVENOUS | Status: DC
  Administered 2017-03-15 – 2017-03-16 (×3): 8 mg/h via INTRAVENOUS
  Filled 2017-03-15 (×4): qty 80

## 2017-03-15 MED ORDER — SODIUM CHLORIDE 0.9 % IV SOLN
INTRAVENOUS | Status: DC
  Administered 2017-03-16: 15:00:00 via INTRAVENOUS
  Administered 2017-03-16: 1000 mL via INTRAVENOUS

## 2017-03-15 MED ORDER — FENTANYL CITRATE (PF) 100 MCG/2ML IJ SOLN
INTRAMUSCULAR | Status: DC | PRN
  Administered 2017-03-15: 50 ug via INTRAVENOUS

## 2017-03-15 MED ORDER — GLYCOPYRROLATE 0.2 MG/ML IJ SOLN
INTRAMUSCULAR | Status: DC | PRN
  Administered 2017-03-15: 0.2 mg via INTRAVENOUS

## 2017-03-15 MED ORDER — LIDOCAINE HCL (PF) 2 % IJ SOLN
INTRAMUSCULAR | Status: DC | PRN
  Administered 2017-03-15: 60 mg via INTRADERMAL

## 2017-03-15 MED ORDER — SODIUM CHLORIDE 0.9 % IV BOLUS (SEPSIS)
1000.0000 mL | Freq: Once | INTRAVENOUS | Status: AC
  Administered 2017-03-15: 1000 mL via INTRAVENOUS

## 2017-03-15 MED ORDER — SERTRALINE HCL 50 MG PO TABS
50.0000 mg | ORAL_TABLET | Freq: Every day | ORAL | Status: DC
  Administered 2017-03-15 – 2017-03-16 (×2): 50 mg via ORAL
  Filled 2017-03-15 (×2): qty 1

## 2017-03-15 MED ORDER — ONDANSETRON HCL 4 MG PO TABS: 4.0000 mg | ORAL_TABLET | Freq: Four times a day (QID) | ORAL | Status: DC | PRN

## 2017-03-15 MED ORDER — ONDANSETRON HCL 4 MG/2ML IJ SOLN: 4.0000 mg | Freq: Four times a day (QID) | INTRAMUSCULAR | Status: DC | PRN

## 2017-03-15 NOTE — Plan of Care (Signed)
Problem: Bowel/Gastric: Goal: Will show no signs and symptoms of gastrointestinal bleeding Outcome: Progressing Pt admitted today from the ED. 1xblack stool with some bright red blood noted.  Hgb 9.3. S/P EGD. Plan for colonoscopy tomorrow.

## 2017-03-15 NOTE — Anesthesia Post-op Follow-up Note (Cosign Needed)
Anesthesia QCDR form completed.        

## 2017-03-15 NOTE — Progress Notes (Signed)
EGD no signs of bleed  Plan   Colonoscopy tomorrow - orders placed for prep Dr [name]  Gastroenterology/Hepatology Pager: [phone]

## 2017-03-15 NOTE — Anesthesia Procedure Notes (Signed)
Date/Time: 03/15/2017 4:15 PM Performed by: Hedda Slade Pre-anesthesia Checklist: Patient identified, Emergency Drugs available, Suction available and Patient being monitored Patient Re-evaluated:Patient Re-evaluated prior to inductionOxygen Delivery Method: Nasal cannula

## 2017-03-15 NOTE — Op Note (Signed)
[facility] Gastroenterology Patient Name: Brenda Campbell Procedure Date: 03/15/2017 4:15 PM MRN: [id number] Account #: [id number] Date of Birth: 03-Aug-1951 Admit Type: Inpatient Age: 66 Room: [facility] ENDO ROOM 4 Gender: Female Note Status: Finalized Procedure:            Upper GI endoscopy Indications:          Heme positive stool Providers:            [name] MD, MD Referring MD:         Juline Patch, MD (Referring MD) Medicines:            Monitored Anesthesia Care Complications:        No immediate complications. Procedure:            Pre-Anesthesia Assessment:                       - Prior to the procedure, a History and Physical was                        performed, and patient medications, allergies and                        sensitivities were reviewed. The patient's tolerance of                        previous anesthesia was reviewed.                       - The risks and benefits of the procedure and the                        sedation options and risks were discussed with the                        patient. All questions were answered and informed                        consent was obtained.                       - ASA Grade Assessment: II - A patient with mild                        systemic disease.                       After obtaining informed consent, the endoscope was                        passed under direct vision. Throughout the procedure,                        the patient's blood pressure, pulse, and oxygen                        saturations were monitored continuously. The Endoscope                        was introduced through the mouth, and advanced to the  third part of duodenum. The upper GI endoscopy was                        accomplished without difficulty. The patient tolerated                        the procedure well. Findings:      The esophagus was normal.      The examined duodenum was normal.   Localized mild inflammation characterized by congestion (edema) and       erythema was found in the prepyloric region of the stomach. Biopsies       were taken with a cold forceps for histology.      Evidence of aprior gastric surgery were found in the gastric body       ?gastric band/sleeve . [Description]. Impression:           - Normal esophagus.                       - Normal examined duodenum.                       - Gastritis. Biopsied.                       - Evidence of previous gastric surgery was found. Recommendation:       - Return patient to hospital ward for ongoing care.                       - Clear liquid diet today.                       - Continue present medications.                       - Await pathology results.                       - Perform a colonoscopy tomorrow. Procedure Code(s):    --- Professional ---                       [phone], Esophagogastroduodenoscopy, flexible, transoral;                        with biopsy, single or multiple Diagnosis Code(s):    --- Professional ---                       R19.5, Other fecal abnormalities                       Z98.0, Intestinal bypass and anastomosis status                       K29.70, Gastritis, unspecified, without bleeding CPT copyright 2016 American Medical Association. All rights reserved. The codes documented in this report are preliminary and upon coder review may  be revised to meet current compliance requirements. [name], MD [name] MD, MD 03/15/2017 4:33:08 PM This report has been signed electronically. Number of Addenda: 0 Note Initiated On: 03/15/2017 4:15 PM      [facility]

## 2017-03-15 NOTE — H&P (Addendum)
Brenda Campbell at [place] NAME: Brenda Campbell    MR#:  [id number]  DATE OF BIRTH:  12-17-1950  DATE OF ADMISSION:  03/15/2017  PRIMARY CARE PHYSICIAN: Otilio Miu, MD   REQUESTING/REFERRING PHYSICIAN: Lisa Roca, MD  CHIEF COMPLAINT:   Chief Complaint  Patient presents with  . Rectal Bleeding   Melena since 2 AM today HISTORY OF PRESENT ILLNESS:  Brenda Campbell  is a 66 y.o. female with a known history of GERD, Hyperlipidemia, hypertension and thyroid disease. The patient presented to the ED with a melena since 2 AM today. Patient has multiple times of tarry loose stool this morning. She feels dizzy and weak. Her blood pressure decreased to 84/58. She was given normal saline bolus, blood pressure is better. Stool occult is positive. ED physician start Protonix drip. She has been taking Goody powder and aspirin at home.  PAST MEDICAL HISTORY:   Past Medical History:  Diagnosis Date  . Anxiety   . Depression   . GERD (gastroesophageal reflux disease)   . Hyperlipidemia   . Hypertension   . Thyroid disease     PAST SURGICAL HISTORY:   Past Surgical History:  Procedure Laterality Date  . CESAREAN SECTION     x 1  . COLONOSCOPY  10/12/2014   cleared for 3 years  . GALLBLADDER SURGERY    . 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 TIBIA FRACTURE SURGERY Right   . WRIST SURGERY Right    arthritis    SOCIAL HISTORY:   Social History  Substance Use Topics  . Smoking status: Former Research scientist (life sciences)  . Smokeless tobacco: Never Used  . Alcohol use 0.0 oz/week    FAMILY HISTORY:   Family History  Problem Relation Age of Onset  . Diabetes Mother   . Heart disease Mother     DRUG ALLERGIES:  No Known Allergies  REVIEW OF SYSTEMS:   Review of Systems  Constitutional: Negative for chills, fever and malaise/fatigue.  HENT: Negative for congestion and nosebleeds.   Eyes: Negative for blurred vision and double vision.    Respiratory: Negative for cough, shortness of breath and stridor.   Cardiovascular: Negative for chest pain and leg swelling.  Gastrointestinal: Positive for diarrhea and melena. Negative for abdominal pain, blood in stool, constipation, nausea and vomiting.  Genitourinary: Negative for dysuria and hematuria.  Musculoskeletal: Negative for back pain.  Skin: Negative for itching and rash.  Neurological: Positive for dizziness and weakness. Negative for focal weakness, loss of consciousness and headaches.  Psychiatric/Behavioral: Negative for depression. The patient is not nervous/anxious.     MEDICATIONS AT HOME:   Prior to Admission medications   Medication Sig Start Date End Date Taking? Authorizing Provider  ALPRAZolam (XANAX) 0.25 MG tablet Take 1 tablet (0.25 mg total) by mouth as needed. 03/13/17  Yes Juline Patch, MD  Aspirin-Acetaminophen-Caffeine [phone] MG PACK Take 1 packet by mouth daily as needed. Take 1 packet by mouth daily as needed for arthritis.   Yes Historical Provider, MD  levothyroxine (SYNTHROID, LEVOTHROID) 50 MCG tablet Take 1 tablet (50 mcg total) by mouth daily. 03/13/17  Yes Juline Patch, MD  ranitidine ([employer] RANITIDINE) 75 MG tablet Take 1 tablet (75 mg total) by mouth as needed. otc 03/13/17  Yes Juline Patch, MD  sertraline (ZOLOFT) 50 MG tablet Take 1 tablet (50 mg total) by mouth daily. 03/13/17  Yes Juline Patch, MD  VITAL SIGNS:  Blood pressure 109/79, pulse 66, temperature 97.5 F (36.4 C), temperature source Oral, resp. rate 18, height 5\' 6"  (1.676 m), weight 200 lb (90.7 kg), SpO2 99 %.  PHYSICAL EXAMINATION:  Physical Exam  GENERAL:  66 y.o.-year-old patient lying in the bed with no acute distress.  EYES: Pupils equal, round, reactive to light and accommodation. No scleral icterus. Extraocular muscles intact.  HEENT: Head atraumatic, normocephalic. Oropharynx and nasopharynx clear.  NECK:  Supple, no jugular venous distention. No thyroid  enlargement, no tenderness.  LUNGS: Normal breath sounds bilaterally, no wheezing, rales,rhonchi or crepitation. No use of accessory muscles of respiration.  CARDIOVASCULAR: S1, S2 normal. No murmurs, rubs, or gallops.  ABDOMEN: Soft, nontender, nondistended. Bowel sounds present. No organomegaly or mass.  EXTREMITIES: No pedal edema, cyanosis, or clubbing.  NEUROLOGIC: Cranial nerves II through XII are intact. Muscle strength 5/5 in all extremities. Sensation intact. Gait not checked.  PSYCHIATRIC: The patient is alert and oriented x 3.  SKIN: No obvious rash, lesion, or ulcer.   LABORATORY PANEL:   CBC  Recent Labs Lab 03/15/17 0753  WBC 12.1*  HGB 11.2*  HCT 31.8*  PLT 312   ------------------------------------------------------------------------------------------------------------------  Chemistries   Recent Labs Lab 03/15/17 0753  NA 135  K 4.3  CL 106  CO2 22  GLUCOSE 149*  BUN 31*  CREATININE 0.51  CALCIUM 8.3*  AST 17  ALT 13*  ALKPHOS 54  BILITOT 0.5   ------------------------------------------------------------------------------------------------------------------  Cardiac Enzymes No results for input(s): TROPONINI in the last 168 hours. ------------------------------------------------------------------------------------------------------------------  RADIOLOGY:  No results found.    IMPRESSION AND PLAN:   GI bleeding The patient will be placed for observation. Keep nothing by mouth except medication water, continue Protonix drip, IV fluids support and GI consult.   Anemia due to acute blood loss.  Check hemoglobin every 6 hours. Hold aspirin. PRBC transfusion when necessary.  Hypotension. Given normal saline bolus, blood pressure is better. Continue normal saline IV.  History of anxiety. Xanax When necessary.   All the records are reviewed and case discussed with ED provider. Management plans discussed with the patient, her husband and they  are in agreement.  CODE STATUS: Full code  TOTAL TIME TAKING CARE OF THIS PATIENT: 51 minutes.    Demetrios Loll M.D on 03/15/2017 at 9:43 AM  Between 7am to 6pm - Pager - [phone]  After 6pm go to [email or web address] - Proofreader  [facility]  Office  [phone]  CC: Primary care physician; Otilio Miu, MD   Note: This dictation was prepared with Dragon dictation along with smaller phrase technology. Any transcriptional errors that result from this process are unintentional.

## 2017-03-15 NOTE — Transfer of Care (Signed)
Immediate Anesthesia Transfer of Care Note  Patient: Brenda Campbell  Procedure(s) Performed: Procedure(s): ESOPHAGOGASTRODUODENOSCOPY (EGD) WITH PROPOFOL (N/A)  Patient Location: PACU  Anesthesia Type:General  Level of Consciousness: awake, alert  and oriented  Airway & Oxygen Therapy: Patient Spontanous Breathing and Patient connected to nasal cannula oxygen  Post-op Assessment: Report given to RN and Post -op Vital signs reviewed and stable  Post vital signs: Reviewed and stable  Last Vitals:  Vitals:   03/15/17 1550 03/15/17 1636  BP: 101/60 110/72  Pulse: 64 83  Resp: 20 15  Temp: 37.6 C 36.2 C    Last Pain:  Vitals:   03/15/17 1636  TempSrc: (P) Tympanic  PainSc:          Complications: No apparent anesthesia complications

## 2017-03-15 NOTE — Anesthesia Preprocedure Evaluation (Signed)
Anesthesia Evaluation  Patient identified by MRN, date of birth, ID band Patient awake    Reviewed: Allergy & Precautions, H&P , NPO status , Patient's Chart, lab work & pertinent test results, reviewed documented beta blocker date and time   History of Anesthesia Complications Negative for: history of anesthetic complications  Airway Mallampati: II  TM Distance: >3 FB Neck ROM: full    Dental  (+) Caps, Missing, Teeth Intact   Pulmonary neg pulmonary ROS, former smoker,           Cardiovascular Exercise Tolerance: Good negative cardio ROS       Neuro/Psych PSYCHIATRIC DISORDERS (Depression) negative neurological ROS     GI/Hepatic Neg liver ROS, GERD  ,  Endo/Other  neg diabetesHypothyroidism   Renal/GU negative Renal ROS  negative genitourinary   Musculoskeletal   Abdominal   Peds  Hematology negative hematology ROS (+)   Anesthesia Other Findings Past Medical History: No date: Anxiety No date: Depression No date: GERD (gastroesophageal reflux disease) No date: Hyperlipidemia No date: Hypertension No date: Thyroid disease   Reproductive/Obstetrics negative OB ROS                             Anesthesia Physical Anesthesia Plan  ASA: II  Anesthesia Plan: General   Post-op Pain Management:    Induction:   Airway Management Planned:   Additional Equipment:   Intra-op Plan:   Post-operative Plan:   Informed Consent: I have reviewed the patients History and Physical, chart, labs and discussed the procedure including the risks, benefits and alternatives for the proposed anesthesia with the patient or authorized representative who has indicated his/her understanding and acceptance.   Dental Advisory Given  Plan Discussed with: Anesthesiologist, CRNA and Surgeon  Anesthesia Plan Comments:         Anesthesia Quick Evaluation

## 2017-03-15 NOTE — ED Provider Notes (Signed)
[facility] Emergency Department Provider Note ____________________________________________   I have reviewed the triage vital signs and the triage nursing note.  HISTORY  Chief Complaint Rectal Bleeding   Historian Patient and spouse  HPI Brenda Campbell is a 66 y.o. female reports being relatively healthy, but treated for paroxysmal SVT, mood disorder, hypothyroidism, presents today with multiple episodes of black and bloody stool. States that she woke up overnight and had several, maybe 3 episodes of watery and black bowel movements with a little bit of bright red blood as well. She's never had GI bleeding the past. 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 She's not had any abdominal pain and she does not have any abdominal pain now.  She takes Goody's powder daily, 500 mg.  No nausea vomiting. No fevers.    Past Medical History:  Diagnosis Date  . Anxiety   . Depression   . Hyperlipidemia   . Hypertension   . Thyroid disease     Patient Active Problem List   Diagnosis Date Noted  . Panic disorder 03/13/2017  . Mild episode of recurrent major depressive disorder ([place]) 03/13/2017  . 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 Paroxysmal supraventricular tachycardia ([place]) 12/23/2014  . 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 Acid reflux 12/22/2014  . Hypothyroidism 12/22/2014  . Adiposity 12/22/2014  . Fast heart beat 12/22/2014    Past Surgical History:  Procedure Laterality Date  . CESAREAN SECTION     x 1  . COLONOSCOPY  10/12/2014   cleared for 3 years  . GALLBLADDER SURGERY    . 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 TIBIA FRACTURE SURGERY Right   . WRIST SURGERY Right    arthritis    Prior to Admission medications   Medication Sig Start Date End Date Taking? Authorizing Provider  ALPRAZolam (XANAX) 0.25 MG tablet  Take 1 tablet (0.25 mg total) by mouth as needed. 03/13/17   Juline Patch, MD  levothyroxine (SYNTHROID, LEVOTHROID) 50 MCG tablet Take 1 tablet (50 mcg total) by mouth daily. 03/13/17   Juline Patch, MD  ranitidine ([employer] RANITIDINE) 75 MG tablet Take 1 tablet (75 mg total) by mouth as needed. otc 03/13/17   Juline Patch, MD  sertraline (ZOLOFT) 50 MG tablet Take 1 tablet (50 mg total) by mouth daily. 03/13/17   Juline Patch, MD    No Known Allergies  History reviewed. No pertinent family history.  Social History Social History  Substance Use Topics  . Smoking status: Former Research scientist (life sciences)  . Smokeless tobacco: Never Used  . Alcohol use 0.0 oz/week    Review of Systems  Constitutional: Negative for fever. Eyes: Negative for visual changes. ENT: Negative for sore throat. Cardiovascular: Negative for chest pain. Respiratory: Negative for shortness of breath. Gastrointestinal: As per history of present illness. Genitourinary: Negative for dysuria. Musculoskeletal: Negative for back pain. Skin: Negative for rash. Neurological: Negative for headache. 10 point Review of Systems otherwise negative ____________________________________________   PHYSICAL EXAM:  VITAL SIGNS: ED Triage Vitals  Enc Vitals Group     BP 03/15/17 0753 (!) 84/58     Pulse Rate 03/15/17 0753 66     Resp 03/15/17 0753 18     Temp 03/15/17 0753 97.5 F (36.4 C)     Temp Source 03/15/17 0753 Oral     SpO2 03/15/17 0753 100 %  Weight 03/15/17 0751 200 lb (90.7 kg)     Height 03/15/17 0751 5\' 6"  (1.676 m)     Head Circumference --      Peak Flow --      Pain Score --      Pain Loc --      Pain Edu? --      Excl. in [place]? --      Constitutional: Alert and oriented. Well appearing and in no distress. HEENT   Head: Normocephalic and atraumatic.      Eyes: Conjunctivae are normal. PERRL. Normal extraocular movements.      Ears:         Nose: No congestion/rhinnorhea.   Mouth/Throat: Mucous membranes  are moist.   Neck: No stridor. Cardiovascular/Chest: Normal rate, regular rhythm.  No murmurs, rubs, or gallops. Respiratory: Normal respiratory effort without tachypnea nor retractions. Breath sounds are clear and equal bilaterally. No wheezes/rales/rhonchi. Gastrointestinal: Soft. No distention, no guarding, no rebound. Nontender.   Genitourinary/rectal:  Nontender rectal exam. She does have strongly heme positive secretions are tinged dark red, no stool in the rectal vault. Musculoskeletal: Nontender with normal range of motion in all extremities. No joint effusions.  No lower extremity tenderness.  No edema. Neurologic:  Normal speech and language. No gross or focal neurologic deficits are appreciated. Skin:  Skin is warm, dry and intact. No rash noted. Psychiatric: Mood and affect are normal. Speech and behavior are normal. Patient exhibits appropriate insight and judgment.   ____________________________________________  LABS (pertinent positives/negatives)  Labs Reviewed  COMPREHENSIVE METABOLIC PANEL - Abnormal; Notable for the following:       Result Value   Glucose, Bld 149 (*)    BUN 31 (*)    Calcium 8.3 (*)    Total Protein 6.1 (*)    Albumin 3.3 (*)    ALT 13 (*)    All other components within normal limits  CBC - Abnormal; Notable for the following:    WBC 12.1 (*)    RBC 3.41 (*)    Hemoglobin 11.2 (*)    HCT 31.8 (*)    All other components within normal limits  POC OCCULT BLOOD, ED  TYPE AND SCREEN    ____________________________________________    EKG I, Lisa Roca, MD, the attending physician have personally viewed and interpreted all ECGs.  65 bpm. Normal specimen. Narrow QS. Normal axis. Normal ST and T-wave ____________________________________________  RADIOLOGY All Xrays were viewed by me. Imaging interpreted by Radiologist.  None __________________________________________  PROCEDURES  Procedure(s) performed: None  Critical Care  performed: None  ____________________________________________   ED COURSE / ASSESSMENT AND PLAN  Pertinent labs & imaging results that were available during my care of the patient were reviewed by me and considered in my medical decision making (see chart for details).   Initially low bp in triage, patient reports feeling dizzy upon standing at home as well.    Repeat blood pressure in the room, 151 systolic. No tachycardia. No abdominal pain. No ongoing bowel movement at present.  I am suspicious of GI bleeding as opposed to gastroenteritis, without nausea or vomiting.  Hemoglobin 11.2, no prior.  I will go ahead and start patient on Protonix bolus and drip for a possible upper GI bleed given the dark black that the patient describes.  We discussed holding the aspirin.    CONSULTATIONS:  Hospitalist for admission.  Patient / Family / Caregiver informed of clinical course, medical decision-making process, and agree with plan.  ___________________________________________   FINAL CLINICAL IMPRESSION(S) / ED DIAGNOSES   Final diagnoses:  Acute GI bleeding              Note: This dictation was prepared with Dragon dictation. Any transcriptional errors that result from this process are unintentional    Lisa Roca, MD 03/15/17 [phone]

## 2017-03-15 NOTE — H&P (Signed)
[name] MD [address], [place], [place] [address] Phone: [phone] Fax : [phone]  Primary Care Physician:  Otilio Miu, MD Primary Gastroenterologist:  Dr. [name]   Pre-Procedure History & Physical: HPI:  Brenda Campbell is a 66 y.o. female is here for an endoscopy.   Past Medical History:  Diagnosis Date  . Anxiety   . Depression   . GERD (gastroesophageal reflux disease)   . Hyperlipidemia   . Hypertension   . Thyroid disease     Past Surgical History:  Procedure Laterality Date  . CESAREAN SECTION     x 1  . COLONOSCOPY  10/12/2014   cleared for 3 years  . GALLBLADDER SURGERY    . 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 TIBIA FRACTURE SURGERY Right   . WRIST SURGERY Right    arthritis    Prior to Admission medications   Medication Sig Start Date End Date Taking? Authorizing Provider  ALPRAZolam (XANAX) 0.25 MG tablet Take 1 tablet (0.25 mg total) by mouth as needed. 03/13/17  Yes Juline Patch, MD  Aspirin-Acetaminophen-Caffeine [phone] MG PACK Take 1 packet by mouth daily as needed. Take 1 packet by mouth daily as needed for arthritis.   Yes Historical Provider, MD  levothyroxine (SYNTHROID, LEVOTHROID) 50 MCG tablet Take 1 tablet (50 mcg total) by mouth daily. 03/13/17  Yes Juline Patch, MD  ranitidine ([employer]S RANITIDINE) 75 MG tablet Take 1 tablet (75 mg total) by mouth as needed. otc 03/13/17  Yes Juline Patch, MD  sertraline (ZOLOFT) 50 MG tablet Take 1 tablet (50 mg total) by mouth daily. 03/13/17  Yes Juline Patch, MD    Allergies as of 03/15/2017  . (No Known Allergies)    Family History  Problem Relation Age of Onset  . Diabetes Mother   . Heart disease Mother     Social History   Social History  . Marital status: Married    Spouse name: N/A  . Number of children: N/A  . Years of education: N/A   Occupational History  . Not on file.   Social History Main Topics  . Smoking status: Former Research scientist (life sciences)  .  Smokeless tobacco: Never Used  . Alcohol use 5.4 oz/week    4 Cans of beer, 5 Shots of liquor per week  . Drug use: No  . Sexual activity: Not Currently   Other Topics Concern  . Not on file   Social History Narrative  . No narrative on file    Review of Systems: See HPI, otherwise negative ROS  Physical Exam: BP 101/60   Pulse 64   Temp 99.6 F (37.6 C) (Tympanic)   Resp 20   Ht 5\' 6"  (1.676 m)   Wt 200 lb (90.7 kg)   SpO2 99%   BMI 32.28 kg/m  General:   Alert,  pleasant and cooperative in NAD Head:  Normocephalic and atraumatic. Neck:  Supple; no masses or thyromegaly. Lungs:  Clear throughout to auscultation.    Heart:  Regular rate and rhythm. Abdomen:  Soft, nontender and nondistended. Normal bowel sounds, without guarding, and without rebound.   Neurologic:  Alert and  oriented x4;  grossly normal neurologically.  Impression/Plan: [place] Ward Symons is here for an endoscopy to be performed for gi bleed   Risks, benefits, limitations, and alternatives regarding  endoscopy have been reviewed with the patient.  Questions have been answered.  All parties agreeable.  [name], MD  03/15/2017, 4:12 PM

## 2017-03-15 NOTE — ED Triage Notes (Addendum)
States she woke up at 2 am and had a bloody stool, states several episodes through the night, states she only takes a baby ASA, states she takes a goody powder daily,  states feeling lightheaded, awake and alert

## 2017-03-15 NOTE — Consult Note (Signed)
[name] MD  [address] [place], [place] [address] Phone: [phone] Fax : [phone]  Consultation  Referring Provider:   Dr Bridgett Larsson Primary Care Physician:  Otilio Miu, MD Primary Gastroenterologist:  None  Reason for Consultation:     GI bleed   Date of Admission:  03/15/2017 Date of Consultation:  03/15/2017         HPI:   Brenda Campbell is a 66 y.o. female presented to the ER earlier today with black tarry stools , dizziness and hypotension .She says she was doing well till last evening when all of a sudden she had sudden onset lower abdominal discomfort followed by multiple episodes of tarry black stools. None since coming to the floor. She says she has been taking a packet of goody poweder for many years. Denies any other use of blood thinners or NSAID's. Presently no complaints.      Past Medical History:  Diagnosis Date  . Anxiety   . Depression   . GERD (gastroesophageal reflux disease)   . Hyperlipidemia   . Hypertension   . Thyroid disease     Past Surgical History:  Procedure Laterality Date  . CESAREAN SECTION     x 1  . COLONOSCOPY  10/12/2014   cleared for 3 years  . GALLBLADDER SURGERY    . 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 TIBIA FRACTURE SURGERY Right   . WRIST SURGERY Right    arthritis    Prior to Admission medications   Medication Sig Start Date End Date Taking? Authorizing Provider  ALPRAZolam (XANAX) 0.25 MG tablet Take 1 tablet (0.25 mg total) by mouth as needed. 03/13/17  Yes Juline Patch, MD  Aspirin-Acetaminophen-Caffeine [phone] MG PACK Take 1 packet by mouth daily as needed. Take 1 packet by mouth daily as needed for arthritis.   Yes Historical Provider, MD  levothyroxine (SYNTHROID, LEVOTHROID) 50 MCG tablet Take 1 tablet (50 mcg total) by mouth daily. 03/13/17  Yes Juline Patch, MD  ranitidine ([facility] RANITIDINE) 75 MG tablet Take 1 tablet (75 mg total) by mouth as needed. otc 03/13/17  Yes Juline Patch, MD    sertraline (ZOLOFT) 50 MG tablet Take 1 tablet (50 mg total) by mouth daily. 03/13/17  Yes Juline Patch, MD    Family History  Problem Relation Age of Onset  . Diabetes Mother   . Heart disease Mother      Social History  Substance Use Topics  . Smoking status: Former Research scientist (life sciences)  . Smokeless tobacco: Never Used  . Alcohol use 0.0 oz/week    Allergies as of 03/15/2017  . (No Known Allergies)    Review of Systems:    All systems reviewed and negative except where noted in HPI.   Physical Exam:  Vital signs in last 24 hours: Temp:  [97.5 F (36.4 C)] 97.5 F (36.4 C) (04/04 0753) Pulse Rate:  [60-66] 60 (04/04 0945) Resp:  [12-19] 13 (04/04 0945) BP: (84-114)/(58-79) 99/64 (04/04 0945) SpO2:  [96 %-100 %] 100 % (04/04 0945) Weight:  [200 lb (90.7 kg)] 200 lb (90.7 kg) (04/04 0751)   General:   Pleasant, cooperative in NAD Head:  Normocephalic and atraumatic. Eyes:   No icterus.   Conjunctiva pink. PERRLA. Ears:  Normal auditory acuity. Neck:  Supple; no masses or thyroidomegaly Lungs: Respirations even and unlabored. Lungs clear to auscultation bilaterally.   No wheezes, crackles, or rhonchi.  Heart:  Regular rate and rhythm;  Without murmur, clicks, rubs or gallops Abdomen:  Soft, nondistended, nontender. Normal bowel sounds. No appreciable masses or hepatomegaly.  No rebound or guarding.  Rectal:  Not performed. Msk:  Symmetrical without gross deformities.   Extremities:  Without edema, cyanosis or clubbing. Neurologic:  Alert and oriented x3;  grossly normal neurologically. Skin:  Intact without significant lesions or rashes Psych:  Alert and cooperative. Normal affect.  LAB RESULTS:  Recent Labs  03/15/17 0753  WBC 12.1*  HGB 11.2*  HCT 31.8*  PLT 312   BMET  Recent Labs  03/15/17 0753  NA 135  K 4.3  CL 106  CO2 22  GLUCOSE 149*  BUN 31*  CREATININE 0.51  CALCIUM 8.3*   LFT  Recent Labs  03/15/17 0753  PROT 6.1*  ALBUMIN 3.3*  AST 17   ALT 13*  ALKPHOS 54  BILITOT 0.5   PT/INR No results for input(s): LABPROT, INR in the last 72 hours.  STUDIES: No results found.    Impression / Plan:   Brenda Ward Yiu is a 66 y.o. y/o female with presentation to the ER with melena , hypotension. Elevated BUN/Cr ratio suggestive of an upper GI bleed very likely secondary to NSAID use. Other differential could also be a diverticular bleed as she really didn't have much pain   Plan   1. NPO 2. IV PPI 3. Monitor CBC and transfuse as needed  4 . EGD today and if negative will need colonoscopy  5. H pylori stool antigen  6. Stop all NSAID's.     Thank you for involving me in the care of this patient.      LOS: 0 days   [name], MD  03/15/2017, 11:49 AM

## 2017-03-16 ENCOUNTER — Encounter: Payer: Self-pay | Admitting: Gastroenterology

## 2017-03-16 ENCOUNTER — Observation Stay: Payer: PPO | Admitting: Anesthesiology

## 2017-03-16 ENCOUNTER — Encounter
Admission: EM | Disposition: A | Discharge status: Home / Self Care | Payer: Self-pay | Source: Home / Self Care | Attending: Emergency Medicine

## 2017-03-16 DIAGNOSIS — K921 Melena: Secondary | ICD-10-CM

## 2017-03-16 DIAGNOSIS — D123 Benign neoplasm of transverse colon: Secondary | ICD-10-CM | POA: Diagnosis not present

## 2017-03-16 DIAGNOSIS — I959 Hypotension, unspecified: Secondary | ICD-10-CM | POA: Diagnosis not present

## 2017-03-16 DIAGNOSIS — K5730 Diverticulosis of large intestine without perforation or abscess without bleeding: Secondary | ICD-10-CM | POA: Diagnosis not present

## 2017-03-16 DIAGNOSIS — K922 Gastrointestinal hemorrhage, unspecified: Secondary | ICD-10-CM | POA: Diagnosis not present

## 2017-03-16 DIAGNOSIS — D649 Anemia, unspecified: Secondary | ICD-10-CM | POA: Diagnosis not present

## 2017-03-16 HISTORY — PX: COLONOSCOPY: SHX5424

## 2017-03-16 LAB — CBC
HCT: 24.1 % — ABNORMAL LOW (ref 35.0–47.0)
Hemoglobin: 8.3 g/dL — ABNORMAL LOW (ref 12.0–16.0)
MCH: 32.5 pg (ref 26.0–34.0)
MCHC: 34.5 g/dL (ref 32.0–36.0)
MCV: 94.2 fL (ref 80.0–100.0)
Platelets: 207 10*3/uL (ref 150–440)
RBC: 2.56 MIL/uL — ABNORMAL LOW (ref 3.80–5.20)
RDW: 12.8 % (ref 11.5–14.5)
WBC: 8 10*3/uL (ref 3.6–11.0)

## 2017-03-16 LAB — PREPARE RBC (CROSSMATCH)

## 2017-03-16 LAB — BASIC METABOLIC PANEL
Anion gap: 3 — ABNORMAL LOW (ref 5–15)
BUN: 19 mg/dL (ref 6–20)
CALCIUM: 7.5 mg/dL — AB (ref 8.9–10.3)
CO2: 25 mmol/L (ref 22–32)
Chloride: 111 mmol/L (ref 101–111)
Creatinine, Ser: 0.53 mg/dL (ref 0.44–1.00)
GFR calc Af Amer: >60 mL/min (ref >60)
GFR calc non Af Amer: >60 mL/min (ref >60)
GLUCOSE: 115 mg/dL — AB (ref 65–99)
Potassium: 3.4 mmol/L — ABNORMAL LOW (ref 3.5–5.1)
SODIUM: 139 mmol/L (ref 135–145)

## 2017-03-16 LAB — MAGNESIUM: MAGNESIUM: 1.9 mg/dL (ref 1.7–2.4)

## 2017-03-16 LAB — ABO/RH: ABO/RH(D): O NEG

## 2017-03-16 LAB — HEMOGLOBIN: HEMOGLOBIN: 8.1 g/dL — AB (ref 12.0–16.0)

## 2017-03-16 SURGERY — COLONOSCOPY
Anesthesia: General

## 2017-03-16 MED ORDER — POTASSIUM CHLORIDE CRYS ER 20 MEQ PO TBCR
40.0000 meq | EXTENDED_RELEASE_TABLET | Freq: Once | ORAL | Status: AC
  Administered 2017-03-16: 16:00:00 40 meq via ORAL
  Filled 2017-03-16: qty 2

## 2017-03-16 MED ORDER — PROPOFOL 500 MG/50ML IV EMUL
INTRAVENOUS | Status: DC | PRN
  Administered 2017-03-16: 150 ug/kg/min via INTRAVENOUS

## 2017-03-16 MED ORDER — PANTOPRAZOLE SODIUM 40 MG IV SOLR
40.0000 mg | Freq: Two times a day (BID) | INTRAVENOUS | Status: DC
  Administered 2017-03-16: 40 mg via INTRAVENOUS
  Filled 2017-03-16: qty 40

## 2017-03-16 MED ORDER — PROPOFOL 10 MG/ML IV BOLUS
INTRAVENOUS | Status: AC
  Filled 2017-03-16: qty 20

## 2017-03-16 MED ORDER — ACETAMINOPHEN 325 MG PO TABS
650.0000 mg | ORAL_TABLET | Freq: Once | ORAL | Status: AC
  Administered 2017-03-16: 15:00:00 650 mg via ORAL
  Filled 2017-03-16: qty 2

## 2017-03-16 MED ORDER — EPHEDRINE SULFATE 50 MG/ML IJ SOLN
INTRAMUSCULAR | Status: DC | PRN
  Administered 2017-03-16: 10 mg via INTRAVENOUS

## 2017-03-16 MED ORDER — PROPOFOL 10 MG/ML IV BOLUS
INTRAVENOUS | Status: DC | PRN
  Administered 2017-03-16: 20 mg via INTRAVENOUS
  Administered 2017-03-16: 50 mg via INTRAVENOUS
  Administered 2017-03-16: 30 mg via INTRAVENOUS

## 2017-03-16 MED ORDER — PROPOFOL 500 MG/50ML IV EMUL
INTRAVENOUS | Status: AC
  Filled 2017-03-16: qty 50

## 2017-03-16 MED ORDER — EPHEDRINE SULFATE 50 MG/ML IJ SOLN
INTRAMUSCULAR | Status: AC
  Filled 2017-03-16: qty 1

## 2017-03-16 MED ORDER — SODIUM CHLORIDE 0.9 % IJ SOLN
INTRAMUSCULAR | Status: AC
  Filled 2017-03-16: qty 10

## 2017-03-16 MED ORDER — SUCCINYLCHOLINE CHLORIDE 20 MG/ML IJ SOLN
INTRAMUSCULAR | Status: AC
  Filled 2017-03-16: qty 1

## 2017-03-16 MED ORDER — PHENYLEPHRINE HCL 10 MG/ML IJ SOLN
INTRAMUSCULAR | Status: AC
  Filled 2017-03-16: qty 1

## 2017-03-16 NOTE — Anesthesia Preprocedure Evaluation (Signed)
Anesthesia Evaluation  Patient identified by MRN, date of birth, ID band Patient awake    Reviewed: Allergy & Precautions, H&P , NPO status , Patient's Chart, lab work & pertinent test results, reviewed documented beta blocker date and time   History of Anesthesia Complications Negative for: history of anesthetic complications  Airway Mallampati: II  TM Distance: >3 FB Neck ROM: full    Dental  (+) Caps, Missing, Teeth Intact   Pulmonary neg pulmonary ROS, neg sleep apnea, neg COPD, former smoker,    breath sounds clear to auscultation- rhonchi (-) wheezing      Cardiovascular Exercise Tolerance: Good hypertension, negative cardio ROS   Rhythm:Regular Rate:Normal - Systolic murmurs and - Diastolic murmurs    Neuro/Psych PSYCHIATRIC DISORDERS (Depression) Anxiety Depression negative neurological ROS     GI/Hepatic Neg liver ROS, GERD  ,GIB   Endo/Other  neg diabetesHypothyroidism   Renal/GU negative Renal ROS  negative genitourinary   Musculoskeletal   Abdominal (+) + obese,   Peds  Hematology negative hematology ROS (+)   Anesthesia Other Findings Past Medical History: No date: Anxiety No date: Depression No date: GERD (gastroesophageal reflux disease) No date: Hyperlipidemia No date: Hypertension No date: Thyroid disease   Reproductive/Obstetrics negative OB ROS                             Anesthesia Physical  Anesthesia Plan  ASA: II  Anesthesia Plan: General   Post-op Pain Management:    Induction:   Airway Management Planned:   Additional Equipment:   Intra-op Plan:   Post-operative Plan:   Informed Consent: I have reviewed the patients History and Physical, chart, labs and discussed the procedure including the risks, benefits and alternatives for the proposed anesthesia with the patient or authorized representative who has indicated his/her understanding and  acceptance.   Dental Advisory Given  Plan Discussed with: Anesthesiologist, CRNA and Surgeon  Anesthesia Plan Comments:         Anesthesia Quick Evaluation

## 2017-03-16 NOTE — Care Management (Signed)
Admitted to this facility with the diagnosis of GI bleed under observation status. Lives with husband. Last seen Dr, Otilio Miu on Monday. No home health. No skilled facility. No home oxygen. Prescriptions are filled at [facility] in [place]. Toilet seat and rolling walker available, if needed. No falls. Good appetite. Takes care of all basic activities of daily living herself, drives. Husband will transport.  NPO Scheduled for Colonscopy today. Shelbie Ammons RN MSN CCM Care Management

## 2017-03-16 NOTE — Anesthesia Post-op Follow-up Note (Cosign Needed)
Anesthesia QCDR form completed.        

## 2017-03-16 NOTE — Anesthesia Postprocedure Evaluation (Signed)
Anesthesia Post Note  Patient: [place] Ward Zilka  Procedure(s) Performed: Procedure(s) (LRB): COLONOSCOPY (N/A)  Patient location during evaluation: Endoscopy Anesthesia Type: General Level of consciousness: awake and alert and oriented Pain management: pain level controlled Vital Signs Assessment: post-procedure vital signs reviewed and stable Respiratory status: spontaneous breathing, nonlabored ventilation and respiratory function stable Cardiovascular status: blood pressure returned to baseline and stable Postop Assessment: no signs of nausea or vomiting Anesthetic complications: no     Last Vitals:  Vitals:   03/16/17 1028 03/16/17 1038  BP: (!) 92/52 94/62  Pulse: 67 66  Resp: 15 15  Temp:      Last Pain:  Vitals:   03/16/17 1038  TempSrc:   PainSc: 0-No pain                 Orma Cheetham

## 2017-03-16 NOTE — Progress Notes (Signed)
[name] NAME: Brenda Campbell    MR#:  [id number]  DATE OF BIRTH:  [birth year]  SUBJECTIVE:  CHIEF COMPLAINT:   Chief Complaint  Patient presents with  . Rectal Bleeding   She has had melena since yesterday, hemoglobin decreased from 11.3 to 8.1 today. She has generalized weakness, but no chest pain or shortness of breath. Status post colonoscopy today.  REVIEW OF SYSTEMS:  Review of Systems  Constitutional: Positive for malaise/fatigue. Negative for chills and fever.  HENT: Negative for congestion and nosebleeds.   Eyes: Negative for blurred vision and double vision.  Respiratory: Negative for cough, shortness of breath and stridor.   Cardiovascular: Negative for chest pain and leg swelling.  Gastrointestinal: Positive for diarrhea and melena. Negative for abdominal pain, blood in stool, constipation, nausea and vomiting.  Genitourinary: Negative for dysuria and hematuria.  Musculoskeletal: Negative for back pain.  Skin: Negative for itching and rash.  Neurological: Positive for weakness. Negative for dizziness, focal weakness and loss of consciousness.  Psychiatric/Behavioral: Negative for depression. The patient is not nervous/anxious.     DRUG ALLERGIES:  No Known Allergies VITALS:  Blood pressure (!) 110/53, pulse (!) 58, temperature (!) 96.6 F (35.9 C), temperature source Axillary, resp. rate 20, height 5\' 6"  (1.676 m), weight 200 lb (90.7 kg), SpO2 100 %. PHYSICAL EXAMINATION:  Physical Exam  Constitutional: She is oriented to person, place, and time and well-developed, well-nourished, and in no distress.  HENT:  Head: Normocephalic.  Mouth/Throat: Oropharynx is clear and moist.  Eyes: Conjunctivae and EOM are normal.  Neck: Normal range of motion. Neck supple. No JVD present. No tracheal deviation present. No thyromegaly present.  Cardiovascular: Normal rate, regular rhythm and normal heart sounds.  Exam  reveals no gallop.   No murmur heard. Pulmonary/Chest: Effort normal and breath sounds normal. No respiratory distress. She has no wheezes. She has no rales.  Abdominal: Soft. Bowel sounds are normal. She exhibits no distension. There is no tenderness. There is no rebound.  Musculoskeletal: Normal range of motion. She exhibits no edema or tenderness.  Neurological: She is alert and oriented to person, place, and time. No cranial nerve deficit.  Skin: No rash noted. No erythema.  Psychiatric: Affect and judgment normal.   LABORATORY PANEL:  Female CBC  Recent Labs Lab 03/16/17 0209 03/16/17 0826  WBC 8.0  --   HGB 8.3* 8.1*  HCT 24.1*  --   PLT 207  --    ------------------------------------------------------------------------------------------------------------------ Chemistries   Recent Labs Lab 03/15/17 0753 03/16/17 0209 03/16/17 0826  NA 135 139  --   K 4.3 3.4*  --   CL 106 111  --   CO2 22 25  --   GLUCOSE 149* 115*  --   BUN 31* 19  --   CREATININE 0.51 0.53  --   CALCIUM 8.3* 7.5*  --   MG 1.9  --  1.9  AST 17  --   --   ALT 13*  --   --   ALKPHOS 54  --   --   BILITOT 0.5  --   --    RADIOLOGY:  No results found. ASSESSMENT AND PLAN:   GI bleeding   She is treated with Protonix drip, IV fluids support. EGD showed gastritis. Colonoscopy show Diverticulosis in the sigmoid colon. One 5 mm polyp in the transverse colon. Non-bleeding internal hemorrhoids. The terminal ileum contained hematin. perform video capsule endoscopy  tomorrow per Dr. Allen Norris. Continue iv Protonix twice a day.  Anemia due to acute blood loss.  Hemoglobin down from 11.3 to 8.1. Hold aspirin. 1 unit PRBC transfusion today. f/u Hb in am.  Hypotension. Given normal saline bolus, blood pressure is better. Continue normal saline IV.  Hypokalemia. Give potassium supplement. Magnesium is normal.  History of anxiety. Xanax When necessary.  I discussed with Dr. Allen Norris. All the records are  reviewed and case discussed with Care Management/Social Worker. Management plans discussed with the patient, family and they are in agreement.  CODE STATUS: Full Code  TOTAL TIME TAKING CARE OF THIS PATIENT: 36 minutes.   More than 50% of the time was spent in counseling/coordination of care: YES  POSSIBLE D/C IN 1-2 DAYS, DEPENDING ON CLINICAL CONDITION.   Demetrios Loll M.D on 03/16/2017 at 2:36 PM  Between 7am to 6pm - Pager - [phone]  After 6pm go to [email or web address] - Proofreader  [facility]  Office  [phone]  CC: Primary care physician; Otilio Miu, MD  Note: This dictation was prepared with Dragon dictation along with smaller phrase technology. Any transcriptional errors that result from this process are unintentional.

## 2017-03-16 NOTE — Progress Notes (Signed)
Pt did not complete the entire container of golytely, but did get 3/4 completed.  Most stool is watery and brown/red tinged per pt report.  She will remain NPO for colonoscopy today.

## 2017-03-16 NOTE — Op Note (Signed)
[facility] Patient Name: Brenda Campbell Procedure Date: 03/16/2017 9:00 AM MRN: [id number] Account #: [id number] Date of Birth: [birth year] Admit Type: Inpatient Age: 66 Room: [facility] ENDO ROOM 3 Gender: Female Note Status: Finalized Procedure:            Colonoscopy Indications:          Hematochezia Providers:            Lucilla Lame MD, MD Referring MD:         Juline Patch, MD (Referring MD) Medicines:            Propofol per Anesthesia Complications:        No immediate complications. Procedure:            Pre-Anesthesia Assessment:                       - Prior to the procedure, a History and Physical was                        performed, and patient medications and allergies were                        reviewed. The patient's tolerance of previous                        anesthesia was also reviewed. The risks and benefits of                        the procedure and the sedation options and risks were                        discussed with the patient. All questions were                        answered, and informed consent was obtained. Prior                        Anticoagulants: The patient has taken no previous                        anticoagulant or antiplatelet agents. ASA Grade                        Assessment: II - A patient with mild systemic disease.                        After reviewing the risks and benefits, the patient was                        deemed in satisfactory condition to undergo the                        procedure.                       After obtaining informed consent, the colonoscope was                        passed under direct vision. Throughout the procedure,  the patient's blood pressure, pulse, and oxygen                        saturations were monitored continuously. The                        Colonoscope was introduced through the anus and                        advanced to the the  terminal ileum. The colonoscopy was                        performed without difficulty. The patient tolerated the                        procedure well. The quality of the bowel preparation                        was fair. Findings:      The perianal and digital rectal examinations were normal.      A 5 mm polyp was found in the transverse colon. The polyp was sessile.       Polypectomy was not attempted due to recent GI bleed.      Multiple small-mouthed diverticula were found in the sigmoid colon.      The terminal ileum contained hematin (altered blood/coffee-ground-like       material).      Non-bleeding internal hemorrhoids were found during retroflexion. The       hemorrhoids were Grade II (internal hemorrhoids that prolapse but reduce       spontaneously). Impression:           - Preparation of the colon was fair.                       - One 5 mm polyp in the transverse colon. Resection not                        attempted.                       - Diverticulosis in the sigmoid colon.                       - Blood in the terminal ileum.                       - Non-bleeding internal hemorrhoids.                       - No specimens collected. Recommendation:       - Return patient to hospital ward for ongoing care.                       - To visualize the small bowel, perform video capsule                        endoscopy.                       - Repeat colonoscopy for polpy removal when not  bleeding. Procedure Code(s):    --- Professional ---                       [id number], Colonoscopy, flexible; diagnostic, including                        collection of specimen(s) by brushing or washing, when                        performed (separate procedure) Diagnosis Code(s):    --- Professional ---                       K92.1, Melena (includes Hematochezia)                       K92.2, Gastrointestinal hemorrhage, unspecified                       D12.3, Benign  neoplasm of transverse colon (hepatic                        flexure or splenic flexure) CPT copyright 2016 American Medical Association. All rights reserved. The codes documented in this report are preliminary and upon coder review may  be revised to meet current compliance requirements. Lucilla Lame MD, MD 03/16/2017 10:17:18 AM This report has been signed electronically. Number of Addenda: 0 Note Initiated On: 03/16/2017 9:00 AM Scope Withdrawal Time: 0 hours 11 minutes 49 seconds  Total Procedure Duration: 0 hours 18 minutes 28 seconds       [facility]

## 2017-03-16 NOTE — Transfer of Care (Signed)
Immediate Anesthesia Transfer of Care Note  Patient: Brenda Campbell  Procedure(s) Performed: Procedure(s): COLONOSCOPY (N/A)  Patient Location: PACU  Anesthesia Type:General  Level of Consciousness: awake, alert  and oriented  Airway & Oxygen Therapy: Patient Spontanous Breathing and Patient connected to nasal cannula oxygen  Post-op Assessment: Report given to RN and Post -op Vital signs reviewed and stable  Post vital signs: Reviewed and stable  Last Vitals:  Vitals:   03/16/17 0928 03/16/17 1018  BP: (!) 100/55 (!) 103/50  Pulse: 67 75  Resp: 20 (!) 21  Temp: 37.2 C 36.7 C    Last Pain:  Vitals:   03/16/17 1018  TempSrc: Tympanic  PainSc: 0-No pain         Complications: No apparent anesthesia complications

## 2017-03-17 ENCOUNTER — Encounter: Payer: Self-pay | Admitting: Gastroenterology

## 2017-03-17 ENCOUNTER — Encounter
Admission: EM | Disposition: A | Discharge status: Home / Self Care | Payer: Self-pay | Source: Home / Self Care | Attending: Emergency Medicine

## 2017-03-17 DIAGNOSIS — K922 Gastrointestinal hemorrhage, unspecified: Secondary | ICD-10-CM | POA: Diagnosis not present

## 2017-03-17 DIAGNOSIS — I959 Hypotension, unspecified: Secondary | ICD-10-CM | POA: Diagnosis not present

## 2017-03-17 DIAGNOSIS — D649 Anemia, unspecified: Secondary | ICD-10-CM | POA: Diagnosis not present

## 2017-03-17 HISTORY — PX: GIVENS CAPSULE STUDY: SHX5432

## 2017-03-17 LAB — BASIC METABOLIC PANEL
ANION GAP: 4 — AB (ref 5–15)
BUN: 10 mg/dL (ref 6–20)
CHLORIDE: 112 mmol/L — AB (ref 101–111)
CO2: 25 mmol/L (ref 22–32)
Calcium: 7.9 mg/dL — ABNORMAL LOW (ref 8.9–10.3)
Creatinine, Ser: 0.56 mg/dL (ref 0.44–1.00)
GFR calc non Af Amer: >60 mL/min (ref >60)
Glucose, Bld: 108 mg/dL — ABNORMAL HIGH (ref 65–99)
POTASSIUM: 3.4 mmol/L — AB (ref 3.5–5.1)
Sodium: 141 mmol/L (ref 135–145)

## 2017-03-17 LAB — HEMOGLOBIN: Hemoglobin: 8.9 g/dL — ABNORMAL LOW (ref 12.0–16.0)

## 2017-03-17 LAB — TYPE AND SCREEN
ABO/RH(D): O NEG
ANTIBODY SCREEN: NEGATIVE
Unit division: 0

## 2017-03-17 LAB — BPAM RBC
BLOOD PRODUCT EXPIRATION DATE: 201804252359
ISSUE DATE / TIME: 201804051501
Unit Type and Rh: 9500

## 2017-03-17 LAB — SURGICAL PATHOLOGY

## 2017-03-17 SURGERY — IMAGING PROCEDURE, GI TRACT, INTRALUMINAL, VIA CAPSULE

## 2017-03-17 MED ORDER — POTASSIUM CHLORIDE CRYS ER 20 MEQ PO TBCR
40.0000 meq | EXTENDED_RELEASE_TABLET | Freq: Once | ORAL | Status: AC
  Administered 2017-03-17: 17:00:00 40 meq via ORAL
  Filled 2017-03-17: qty 2

## 2017-03-17 MED ORDER — PANTOPRAZOLE SODIUM 40 MG PO TBEC: 40.0000 mg | DELAYED_RELEASE_TABLET | Freq: Every day | ORAL | 0 refills | Status: DC

## 2017-03-17 MED ORDER — PANTOPRAZOLE SODIUM 40 MG PO TBEC
40.0000 mg | DELAYED_RELEASE_TABLET | Freq: Two times a day (BID) | ORAL | Status: DC
  Administered 2017-03-17: 40 mg via ORAL
  Filled 2017-03-17: qty 1

## 2017-03-17 NOTE — Progress Notes (Addendum)
[name] MD [address], [place], [place] [address] Phone: [phone] Fax : [phone]  [facility] is being followed for GI bleed  Day 3  of follow up   Subjective: No rectal bleeding since yesterday am    Objective: Vital signs in last 24 hours: Vitals:   03/16/17 1532 03/16/17 1803 03/16/17 2050 03/17/17 0457  BP: (!) 104/48 (!) 142/61 (!) 101/58 (!) 100/56  Pulse: 60 69 62 73  Resp: 18 20 18 18   Temp: 98.1 F (36.7 C) 98.7 F (37.1 C) 98.5 F (36.9 C) 98.6 F (37 C)  TempSrc: Oral Oral Oral Oral  SpO2: 100% 100% 100% 98%  Weight:      Height:       Weight change:   Intake/Output Summary (Last 24 hours) at 03/17/17 0707 Last data filed at 03/17/17 0013  Gross per 24 hour  Intake          3340.32 ml  Output                0 ml  Net          3340.32 ml     Exam: Heart:: Regular rate and rhythm, S1S2 present or without murmur or extra heart sounds Lungs: normal, clear to auscultation and clear to auscultation and percussion Abdomen: soft, nontender, normal bowel sounds   Lab Results: CBC Latest Ref Rng & Units 03/17/2017 03/16/2017 03/16/2017  WBC 3.6 - 11.0 K/uL - - 8.0  Hemoglobin 12.0 - 16.0 g/dL 8.9(L) 8.1(L) 8.3(L)  Hematocrit 35.0 - 47.0 % - - 24.1(L)  Platelets 150 - 440 K/uL - - 207   BMP Latest Ref Rng & Units 03/17/2017 03/16/2017 03/15/2017  Glucose 65 - 99 mg/dL 108(H) 115(H) 149(H)  BUN 6 - 20 mg/dL 10 19 31(H)  Creatinine 0.44 - 1.00 mg/dL 0.56 0.53 0.51  BUN/Creat Ratio 12 - 28 - - -  Sodium 135 - 145 mmol/L 141 139 135  Potassium 3.5 - 5.1 mmol/L 3.4(L) 3.4(L) 4.3  Chloride 101 - 111 mmol/L 112(H) 111 106  CO2 22 - 32 mmol/L 25 25 22   Calcium 8.9 - 10.3 mg/dL 7.9(L) 7.5(L) 8.3(L)    Micro Results: No results found for this or any previous visit (from the past 240 hour(s)). Studies/Results: No results found. Medications: I have reviewed the patient's current medications. Scheduled Meds: . levothyroxine  50 mcg Oral  [id number]  . pantoprazole  40 mg Intravenous Q12H  . sertraline  50 mg Oral Daily   Continuous Infusions: . sodium chloride 10 mL/hr at 03/16/17 1449  . 0.9 % NaCl with KCl 20 mEq / L 100 mL/hr at 03/17/17 0204   PRN Meds:.acetaminophen **OR** acetaminophen, albuterol, ALPRAZolam, ondansetron **OR** ondansetron (ZOFRAN) IV   Assessment: Active Problems:   GIB (gastrointestinal bleeding)   Blood in stool   Acute GI bleeding   Benign neoplasm of transverse colon  [place] Ward Pasqual is a 66 y.o. y/o female with presentation to the ER 03/15/17  with melena , hypotension. EGD was normal, colonoscopy showed blood in the terminal ileum . Presently no rectal bleeding since yesterday , she has done prep for capsule study with no blood suggesting the bleeding has stopped   Plan   1. Monitor CBC and transfuse as needed 2. Capsule study planned for today  3. Stop all NSAID's 4. In the interim if she bleeds before the capsule study is interpreted then will need tagged rbc scan . If she does not  bleed till this evening then can be discharged with OP GI follow up and pcp to check CBC in 2-3 days time     LOS: 0 days   [name] 03/17/2017, 7:07 AM

## 2017-03-17 NOTE — Progress Notes (Signed)
Patient's capsule study is complete and she is ready for discharge.  Discussed discharge instructions and medications with pt and her husband. IVs removed. All questions addressed. Pt transported home via car by her husband.  Clarise Cruz, RN

## 2017-03-17 NOTE — Discharge Instructions (Signed)
NO NSAIDS. Follow up hemoglobin with PCP in 2-3 days.

## 2017-03-17 NOTE — Discharge Summary (Signed)
[facility] [place] NAME: Brenda Campbell    MR#:  [id number]  DATE OF BIRTH:  [birth year]  DATE OF ADMISSION:  03/15/2017   ADMITTING PHYSICIAN: Demetrios Loll, MD  DATE OF DISCHARGE: 03/17/2017  PRIMARY CARE PHYSICIAN: Otilio Miu, MD   ADMISSION DIAGNOSIS:  Acute GI bleeding [K92.2] DISCHARGE DIAGNOSIS:  Active Problems:   GIB (gastrointestinal bleeding)   Blood in stool   Acute GI bleeding   Benign neoplasm of transverse colon GIB, gastritis. Diverticulosis in the sigmoid colon polyp in the transverse colon Anemia due to acute blood loss. SECONDARY DIAGNOSIS:   Past Medical History:  Diagnosis Date  . Anxiety   . Depression   . GERD (gastroesophageal reflux disease)   . Hyperlipidemia   . Hypertension   . Thyroid disease    HOSPITAL COURSE:   GI bleeding She was treated with Protonix drip, IV fluids support. EGD showed gastritis. Colonoscopy show Diverticulosis in the sigmoid colon. One 5 mm polyp in the transverse colon. Non-bleeding internal hemorrhoids. The terminal ileum contained hematin. perform video capsule endoscopy today per Dr. Vicente Males. The patient has no active bleeding today. Continue Protonix his by mouth in the follow-up GI as outpatient.  Anemia due to acute blood loss.  Hemoglobin down from 11.3 to 8.1. Hold aspirin. s/p 1 unit PRBC transfusion. Hemoglobin increased to 8.9 today.  Hypotension. Given normal saline bolus, blood pressure is better. Hypokalemia. Give potassium supplement. Magnesium is normal.  History of anxiety. Xanax When necessary.  DISCHARGE CONDITIONS:  Stable, discharge to home today. CONSULTS OBTAINED:  Treatment Team:  [name], MD DRUG ALLERGIES:  No Known Allergies DISCHARGE MEDICATIONS:   Allergies as of 03/17/2017   No Known Allergies     Medication List    STOP taking these medications   Aspirin-Acetaminophen-Caffeine 500-325-65 MG Pack     TAKE these  medications   ALPRAZolam 0.25 MG tablet Commonly known as:  XANAX Take 1 tablet (0.25 mg total) by mouth as needed.   levothyroxine 50 MCG tablet Commonly known as:  SYNTHROID, LEVOTHROID Take 1 tablet (50 mcg total) by mouth daily.   pantoprazole 40 MG tablet Commonly known as:  PROTONIX Take 1 tablet (40 mg total) by mouth daily.   ranitidine 75 MG tablet Commonly known as:  [facility] RANITIDINE Take 1 tablet (75 mg total) by mouth as needed. otc   sertraline 50 MG tablet Commonly known as:  ZOLOFT Take 1 tablet (50 mg total) by mouth daily.        DISCHARGE INSTRUCTIONS:  See AVS.  If you experience worsening of your admission symptoms, develop shortness of breath, life threatening emergency, suicidal or homicidal thoughts you must seek medical attention immediately by calling 911 or calling your MD immediately  if symptoms less severe.  You Must read complete instructions/literature along with all the possible adverse reactions/side effects for all the Medicines you take and that have been prescribed to you. Take any new Medicines after you have completely understood and accpet all the possible adverse reactions/side effects.   Please note  You were cared for by a hospitalist during your hospital stay. If you have any questions about your discharge medications or the care you received while you were in the hospital after you are discharged, you can call the unit and asked to speak with the hospitalist on call if the hospitalist that took care of you is not available. Once you are discharged, your primary care physician  will handle any further medical issues. Please note that NO REFILLS for any discharge medications will be authorized once you are discharged, as it is imperative that you return to your primary care physician (or establish a relationship with a primary care physician if you do not have one) for your aftercare needs so that they can reassess your need for medications and  monitor your lab values.    On the day of Discharge:  VITAL SIGNS:  Blood pressure (!) 110/57, pulse 72, temperature 98.8 F (37.1 C), temperature source Oral, resp. rate 20, height 5\' 6"  (1.676 m), weight 200 lb (90.7 kg), SpO2 99 %. PHYSICAL EXAMINATION:  GENERAL:  66 y.o.-year-old patient lying in the bed with no acute distress.  EYES: Pupils equal, round, reactive to light and accommodation. No scleral icterus. Extraocular muscles intact.  HEENT: Head atraumatic, normocephalic. Oropharynx and nasopharynx clear.  NECK:  Supple, no jugular venous distention. No thyroid enlargement, no tenderness.  LUNGS: Normal breath sounds bilaterally, no wheezing, rales,rhonchi or crepitation. No use of accessory muscles of respiration.  CARDIOVASCULAR: S1, S2 normal. No murmurs, rubs, or gallops.  ABDOMEN: Soft, non-tender, non-distended. Bowel sounds present. No organomegaly or mass.  EXTREMITIES: No pedal edema, cyanosis, or clubbing.  NEUROLOGIC: Cranial nerves II through XII are intact. Muscle strength 5/5 in all extremities. Sensation intact. Gait not checked.  PSYCHIATRIC: The patient is alert and oriented x 3.  SKIN: No obvious rash, lesion, or ulcer.  DATA REVIEW:   CBC  Recent Labs Lab 03/16/17 0209  03/17/17 0407  WBC 8.0  --   --   HGB 8.3*  < > 8.9*  HCT 24.1*  --   --   PLT 207  --   --   < > = values in this interval not displayed.  Chemistries   Recent Labs Lab 03/15/17 0753  03/16/17 0826 03/17/17 0407  NA 135  < >  --  141  K 4.3  < >  --  3.4*  CL 106  < >  --  112*  CO2 22  < >  --  25  GLUCOSE 149*  < >  --  108*  BUN 31*  < >  --  10  CREATININE 0.51  < >  --  0.56  CALCIUM 8.3*  < >  --  7.9*  MG 1.9  --  1.9  --   AST 17  --   --   --   ALT 13*  --   --   --   ALKPHOS 54  --   --   --   BILITOT 0.5  --   --   --   < > = values in this interval not displayed.   Microbiology Results  Results for orders placed or performed in visit on 10/23/14    Rapid Strep-A with Reflx     Status: None   Collection Time: 10/23/14 11:33 AM  Result Value Ref Range Status   Micro Text Report POSITIVE  Final    RADIOLOGY:  No results found.   Management plans discussed with the patient, family and they are in agreement.  CODE STATUS: Full Code   TOTAL TIME TAKING CARE OF THIS PATIENT: 33 minutes.    Demetrios Loll M.D on 03/17/2017 at 5:12 PM  Between 7am to 6pm - Pager - [phone]  After 6pm go to [email or web address] - [facility]  Office  [phone]  CC: Primary  care physician; Otilio Miu, MD   Note: This dictation was prepared with Dragon dictation along with smaller phrase technology. Any transcriptional errors that result from this process are unintentional.

## 2017-03-20 ENCOUNTER — Telehealth: Payer: Self-pay

## 2017-03-20 ENCOUNTER — Other Ambulatory Visit: Payer: Self-pay

## 2017-03-20 ENCOUNTER — Ambulatory Visit
Admission: RE | Admit: 2017-03-20 | Discharge: 2017-03-20 | Disposition: A | Discharge status: Home / Self Care | Payer: PPO | Source: Ambulatory Visit | Attending: Gastroenterology | Admitting: Gastroenterology

## 2017-03-20 ENCOUNTER — Encounter: Payer: Self-pay | Admitting: Gastroenterology

## 2017-03-20 ENCOUNTER — Other Ambulatory Visit
Admission: RE | Admit: 2017-03-20 | Discharge: 2017-03-20 | Disposition: A | Discharge status: Home / Self Care | Payer: PPO | Source: Ambulatory Visit | Attending: Family Medicine | Admitting: Family Medicine

## 2017-03-20 ENCOUNTER — Ambulatory Visit (INDEPENDENT_AMBULATORY_CARE_PROVIDER_SITE_OTHER): Payer: PPO | Admitting: Family Medicine

## 2017-03-20 VITALS — BP 118/84 | HR 67 | Resp 16 | Ht 66.0 in | Wt 199.0 lb

## 2017-03-20 DIAGNOSIS — K922 Gastrointestinal hemorrhage, unspecified: Secondary | ICD-10-CM

## 2017-03-20 DIAGNOSIS — A048 Other specified bacterial intestinal infections: Secondary | ICD-10-CM

## 2017-03-20 DIAGNOSIS — K921 Melena: Secondary | ICD-10-CM

## 2017-03-20 DIAGNOSIS — Z09 Encounter for follow-up examination after completed treatment for conditions other than malignant neoplasm: Secondary | ICD-10-CM | POA: Diagnosis not present

## 2017-03-20 LAB — CBC WITH DIFFERENTIAL/PLATELET
BASOS ABS: 0 10*3/uL (ref 0–0.1)
BASOS PCT: 1 %
EOS ABS: 0.2 10*3/uL (ref 0–0.7)
EOS PCT: 4 %
HCT: 24.5 % — ABNORMAL LOW (ref 35.0–47.0)
HEMOGLOBIN: 8.3 g/dL — AB (ref 12.0–16.0)
Lymphocytes Relative: 22 %
Lymphs Abs: 1.5 10*3/uL (ref 1.0–3.6)
MCH: 32 pg (ref 26.0–34.0)
MCHC: 33.9 g/dL (ref 32.0–36.0)
MCV: 94.4 fL (ref 80.0–100.0)
Monocytes Absolute: 0.6 10*3/uL (ref 0.2–0.9)
Monocytes Relative: 9 %
NEUTROS PCT: 64 %
Neutro Abs: 4.2 10*3/uL (ref 1.4–6.5)
PLATELETS: 270 10*3/uL (ref 150–440)
RBC: 2.59 MIL/uL — AB (ref 3.80–5.20)
RDW: 14.2 % (ref 11.5–14.5)
WBC: 6.5 10*3/uL (ref 3.6–11.0)

## 2017-03-20 MED ORDER — AMOXICILLIN 500 MG PO CAPS: 1000.0000 mg | ORAL_CAPSULE | Freq: Three times a day (TID) | ORAL | 0 refills | Status: AC

## 2017-03-20 MED ORDER — OMEPRAZOLE 20 MG PO CPDR: 20.0000 mg | DELAYED_RELEASE_CAPSULE | Freq: Two times a day (BID) | ORAL | 3 refills | Status: DC

## 2017-03-20 MED ORDER — CLARITHROMYCIN 500 MG PO TABS: 500.0000 mg | ORAL_TABLET | Freq: Two times a day (BID) | ORAL | 0 refills | Status: AC

## 2017-03-20 NOTE — Patient Instructions (Signed)
Helicobacter Pylori Infection Helicobacter pylori infection is an infection in the stomach that is caused by the Helicobacter pylori (H. pylori) bacteria. This type of bacteria often lives in the lining of the stomach. The infection can cause ulcers and irritation (gastritis) in some people. It is the most common cause of ulcers in the stomach (gastric ulcer) and in the upper part of the intestine (duodenal ulcer). Having this infection may also increase the risk of stomach cancer and a type of white blood cell cancer (lymphoma) that affects the stomach. What are the causes? H. pylori is a type of bacteria that is often found in the stomachs of healthy people. The bacteria may be passed from person to person through contact with stool or saliva. It is not known why some people develop ulcers, gastritis, or cancer from the infection. What increases the risk? This condition is more likely to develop in people who:  Have family members with the infection.  Live with many other people, such as in a dormitory.  Are of African, Hispanic, or Asian descent. What are the signs or symptoms? Most people with this infection do not have symptoms. If you do have symptoms, they may include:  Heartburn.  Stomach pain.  Nausea.  Vomiting.  Blood-tinged vomit.  Loss of appetite.  Bad breath. How is this diagnosed? This condition may be diagnosed based on your symptoms, a physical exam, and various tests. Tests may include:  Blood tests or stool tests to check for the proteins (antibodies) that your body may produce in response to the bacteria. These tests are the best way to confirm the diagnosis.  A breath test to check for the type of gas that the H. pylori bacteria release after breaking down a substance called urea. For the test, you are asked to drink urea. This test is often done after treatment in order to find out if the treatment worked.  A procedure in which a thin, flexible tube with a  tiny camera at the end is placed into your stomach and upper intestine (upper endoscopy). Your health care provider may also take tissue samples (biopsy) to test for H. pylori and cancer. How is this treated? Treatment for this condition usually involves taking a combination of medicines (triple therapy) for a couple of weeks. Triple therapy includes one medicine to reduce the acid in your stomach and two types of antibiotic medicines. Many drug combinations have been approved for treatment. Treatment usually kills the H. pylori and reduces your risk of cancer. You may need to be tested for H. pylori again after treatment. In some cases, the treatment may need to be repeated. Follow these instructions at home:  Take over-the-counter and prescription medicines only as told by your health care provider.  Take your antibiotics as told by your health care provider. Do not stop taking the antibiotics even if you start to feel better.  You can do all your usual activities and eat what you usually do.  Take steps to prevent future infections:  Wash your hands often.  Make sure the food you eat has been properly prepared.  Drink water only from clean sources.  Keep all follow-up visits as told by your health care provider. This is important. Contact a health care provider if:  Your symptoms do not get better.  Your symptoms return after treatment. This information is not intended to replace advice given to you by your health care provider. Make sure you discuss any questions you have with  your health care provider. Document Released: 03/21/2016 Document Revised: 05/05/2016 Document Reviewed: 12/10/2014 Elsevier Interactive Patient Education  2017 [employer].

## 2017-03-20 NOTE — Telephone Encounter (Signed)
Spoke to patient while she was at PCP office.   Nurse took result and treatment information.  Information entered directly into patient chart.  Advised 14 day treatment for H Pylori and bx results.

## 2017-03-20 NOTE — Progress Notes (Signed)
Name: Brenda Campbell   MRN: [id number]    DOB: [birth year]   Date:03/20/2017       Progress Note  Subjective  Chief Complaint  Chief Complaint  Patient presents with  . 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 Hpylori will be treated 14 days on Chlorithromycin.Marland KitchenMarland KitchenMarland KitchenAmoxicillin GI Bleed FU - feeling better anhd awaiting results from the camera she swallowed.     Patient presents for hospital followup   GI Problem  Primary symptoms do not include fever, weight loss, fatigue, abdominal pain, nausea, vomiting, diarrhea, melena, hematemesis, jaundice, hematochezia, dysuria, myalgias, arthralgias or rash. The illness began more than 7 days ago. The problem has been gradually improving.  The illness does not include chills, anorexia, dysphagia, odynophagia, bloating, constipation, tenesmus, back pain or itching. Significant associated medical issues include PUD. Associated medical issues do not include inflammatory bowel disease, GERD, gallstones, liver disease, alcohol abuse, gastric bypass, bowel resection, irritable bowel syndrome, hemorrhoids or diverticulitis.    No problem-specific Assessment & Plan notes found for this encounter.   Past Medical History:  Diagnosis Date  . Anxiety   . Depression   . GERD (gastroesophageal reflux disease)   . Hyperlipidemia   . Hypertension   . Thyroid disease     Past Surgical History:  Procedure Laterality Date  . CESAREAN SECTION     x 1  . COLONOSCOPY  10/12/2014   cleared for 3 years  . COLONOSCOPY N/A 03/16/2017   Procedure: COLONOSCOPY;  Surgeon: Lucilla Lame, MD;  Location: [facility] ENDOSCOPY;  Service: Endoscopy;  Laterality: N/A;  . ESOPHAGOGASTRODUODENOSCOPY (EGD) WITH PROPOFOL N/A 03/15/2017   Procedure: ESOPHAGOGASTRODUODENOSCOPY (EGD) WITH PROPOFOL;  Surgeon: [name], MD;  Location: [facility] ENDOSCOPY;  Service: Endoscopy;  Laterality: N/A;  . GALLBLADDER SURGERY    . GIVENS CAPSULE STUDY N/A  03/17/2017   Procedure: GIVENS CAPSULE STUDY;  Surgeon: Lucilla Lame, MD;  Location: [facility] ENDOSCOPY;  Service: Endoscopy;  Laterality: N/A;  . 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 TIBIA FRACTURE SURGERY Right   . WRIST SURGERY Right    arthritis    Family History  Problem Relation Age of Onset  . Diabetes Mother   . Heart disease Mother     Social History   Social History  . Marital status: Married    Spouse name: N/A  . Number of children: N/A  . Years of education: N/A   Occupational History  . Not on file.   Social History Main Topics  . Smoking status: Former Research scientist (life sciences)  . Smokeless tobacco: Never Used  . Alcohol use 5.4 oz/week    4 Cans of beer, 5 Shots of liquor per week  . Drug use: No  . Sexual activity: Not Currently   Other Topics Concern  . Not on file   Social History Narrative  . No narrative on file    No Known Allergies  Outpatient Medications Prior to Visit  Medication Sig Dispense Refill  . ALPRAZolam (XANAX) 0.25 MG tablet Take 1 tablet (0.25 mg total) by mouth as needed. 30 tablet 0  . levothyroxine (SYNTHROID, LEVOTHROID) 50 MCG tablet Take 1 tablet (50 mcg total) by mouth daily. 30 tablet 11  . pantoprazole (PROTONIX) 40 MG tablet Take 1 tablet (40 mg total) by mouth daily. 60 tablet 0  . ranitidine ([facility]S RANITIDINE) 75 MG tablet Take 1 tablet (75 mg total) by mouth as  needed. otc 90 tablet 3  . sertraline (ZOLOFT) 50 MG tablet Take 1 tablet (50 mg total) by mouth daily. 30 tablet 11   No facility-administered medications prior to visit.     Review of Systems  Constitutional: Negative for chills, fatigue, fever, malaise/fatigue and weight loss.  HENT: Negative for ear discharge, ear pain and sore throat.   Eyes: Negative for blurred vision.  Respiratory: Negative for cough, sputum production, shortness of breath and wheezing.   Cardiovascular: Negative for chest pain, palpitations and leg swelling.  Gastrointestinal: Negative for  abdominal pain, anorexia, bloating, blood in stool, constipation, diarrhea, dysphagia, heartburn, hematemesis, hematochezia, jaundice, melena, nausea and vomiting.  Genitourinary: Negative for dysuria, frequency, hematuria and urgency.  Musculoskeletal: Negative for arthralgias, back pain, joint pain, myalgias and neck pain.  Skin: Negative for itching and rash.  Neurological: Negative for dizziness, tingling, sensory change, focal weakness and headaches.  Endo/Heme/Allergies: Negative for environmental allergies and polydipsia. Does not bruise/bleed easily.  Psychiatric/Behavioral: Negative for depression and suicidal ideas. The patient is not nervous/anxious and does not have insomnia.      Objective  Vitals:   03/20/17 1119  BP: 118/84  Pulse: 67  Resp: 16  SpO2: 100%  Weight: 199 lb (90.3 kg)  Height: 5\' 6"  (1.676 m)    Physical Exam  Constitutional: She is well-developed, well-nourished, and in no distress. No distress.  HENT:  Head: Normocephalic and atraumatic.  Right Ear: External ear normal.  Left Ear: External ear normal.  Nose: Nose normal.  Mouth/Throat: Oropharynx is clear and moist.  Eyes: Conjunctivae and EOM are normal. Pupils are equal, round, and reactive to light. Right eye exhibits no discharge. Left eye exhibits no discharge.  Neck: Normal range of motion. Neck supple. No JVD present. No thyromegaly present.  Cardiovascular: Normal rate, regular rhythm, normal heart sounds and intact distal pulses.  Exam reveals no gallop and no friction rub.   No murmur heard. Pulmonary/Chest: Effort normal and breath sounds normal. She has no wheezes. She has no rales.  Abdominal: Soft. Bowel sounds are normal. She exhibits no mass. There is no hepatosplenomegaly. There is no tenderness. There is no rebound and no guarding.  Musculoskeletal: Normal range of motion. She exhibits no edema.  Lymphadenopathy:    She has no cervical adenopathy.  Neurological: She is alert. She  has normal reflexes.  Skin: Skin is warm and dry. She is not diaphoretic.  Psychiatric: Mood and affect normal.  Nursing note and vitals reviewed.     Assessment & Plan  Problem List Items Addressed This Visit    None    Visit Diagnoses    Hospital discharge follow-up    -  Primary   Gastric bleeding       Relevant Orders   CBC with Differential (Completed)      No orders of the defined types were placed in this encounter.     Dr. Macon [facility]  03/20/17

## 2017-03-21 ENCOUNTER — Encounter: Payer: Self-pay | Admitting: Gastroenterology

## 2017-03-21 NOTE — Telephone Encounter (Signed)
error 

## 2017-03-22 NOTE — Anesthesia Postprocedure Evaluation (Signed)
Anesthesia Post Note  Patient: Brenda Campbell  Procedure(s) Performed: Procedure(s) (LRB): ESOPHAGOGASTRODUODENOSCOPY (EGD) WITH PROPOFOL (N/A)  Patient location during evaluation: Endoscopy Anesthesia Type: General Level of consciousness: awake and alert Pain management: pain level controlled Vital Signs Assessment: post-procedure vital signs reviewed and stable Respiratory status: spontaneous breathing, nonlabored ventilation, respiratory function stable and patient connected to nasal cannula oxygen Cardiovascular status: blood pressure returned to baseline and stable Postop Assessment: no signs of nausea or vomiting Anesthetic complications: no     Last Vitals:  Vitals:   03/17/17 0457 03/17/17 1356  BP: (!) 100/56 (!) 110/57  Pulse: 73 72  Resp: 18 20  Temp: 37 C 37.1 C    Last Pain:  Vitals:   03/17/17 1356  TempSrc: Oral  PainSc:                  Martha Clan

## 2017-03-23 ENCOUNTER — Ambulatory Visit (INDEPENDENT_AMBULATORY_CARE_PROVIDER_SITE_OTHER): Payer: PPO | Admitting: Gastroenterology

## 2017-03-23 ENCOUNTER — Other Ambulatory Visit: Payer: Self-pay

## 2017-03-23 ENCOUNTER — Encounter: Payer: Self-pay | Admitting: Gastroenterology

## 2017-03-23 VITALS — BP 110/74 | HR 59 | Temp 98.1°F | Ht 66.0 in | Wt 199.8 lb

## 2017-03-23 DIAGNOSIS — K921 Melena: Secondary | ICD-10-CM

## 2017-03-23 DIAGNOSIS — K3189 Other diseases of stomach and duodenum: Secondary | ICD-10-CM | POA: Diagnosis not present

## 2017-03-23 DIAGNOSIS — A048 Other specified bacterial intestinal infections: Secondary | ICD-10-CM

## 2017-03-23 DIAGNOSIS — K922 Gastrointestinal hemorrhage, unspecified: Secondary | ICD-10-CM

## 2017-03-23 DIAGNOSIS — K31A0 Gastric intestinal metaplasia, unspecified: Secondary | ICD-10-CM

## 2017-03-23 NOTE — Progress Notes (Signed)
Primary Care Physician: Otilio Miu, MD  Primary Gastroenterologist:  Dr. [name]   Chief Complaint  Patient presents with  . Anemia    HPI: Brenda Campbell is a 66 y.o. female is here today for a hospital follow up .   Summary of history : She was admitted on 03/15/2017 with melena. It began all of a sudden. I was consulted to see her . I performed an EGD which was negative for an active bleed but showed features of gastritis which on pathology was positive for H pylori as well as focal intestinal metaplasia. We prescribed her H pylori eradication therapy. . A colonoscopy showed blood in the terminal ileum and my plan was to perform a capsule study.   I performed a capsule study prior to discharge but unfortunately it did not exit the stomach in time before she had food ingested and hence visualization was incomplete.   Hb at discharge was 8.9 grams. On 03/20/17 was 8.3 grams.  Interval history   Since discharge   Brown colored stool , feels weak . Has not seen the small bowel capsule exit the small bowel    Current Outpatient Prescriptions  Medication Sig Dispense Refill  . ALPRAZolam (XANAX) 0.25 MG tablet Take 1 tablet (0.25 mg total) by mouth as needed. 30 tablet 0  . amoxicillin (AMOXIL) 500 MG capsule Take 2 capsules (1,000 mg total) by mouth 3 (three) times daily. 84 capsule 0  . clarithromycin (BIAXIN) 500 MG tablet Take 1 tablet (500 mg total) by mouth 2 (two) times daily. 28 tablet 0  . levothyroxine (SYNTHROID, LEVOTHROID) 50 MCG tablet Take 1 tablet (50 mcg total) by mouth daily. 30 tablet 11  . pantoprazole (PROTONIX) 40 MG tablet Take 1 tablet (40 mg total) by mouth daily. 60 tablet 0  . sertraline (ZOLOFT) 50 MG tablet Take 1 tablet (50 mg total) by mouth daily. 30 tablet 11  . omeprazole (PRILOSEC) 20 MG capsule Take 1 capsule (20 mg total) by mouth 2 (two) times daily before a meal. (Patient not taking: Reported on 03/23/2017) 90 capsule 3  . ranitidine  ([facility]S RANITIDINE) 75 MG tablet Take 1 tablet (75 mg total) by mouth as needed. otc (Patient not taking: Reported on 03/23/2017) 90 tablet 3   No current facility-administered medications for this visit.     Allergies as of 03/23/2017  . (No Known Allergies)    ROS:  General: Negative for anorexia, weight loss, fever, chills, fatigue, weakness. ENT: Negative for hoarseness, difficulty swallowing , nasal congestion. CV: Negative for chest pain, angina, palpitations, dyspnea on exertion, peripheral edema.  Respiratory: Negative for dyspnea at rest, dyspnea on exertion, cough, sputum, wheezing.  GI: See history of present illness. GU:  Negative for dysuria, hematuria, urinary incontinence, urinary frequency, nocturnal urination.  Endo: Negative for unusual weight change.    Physical Examination:   BP 110/74 (BP Location: Left Arm, Patient Position: Sitting, Cuff Size: Large)   Pulse (!) 59   Temp 98.1 F (36.7 C) (Oral)   Ht 5\' 6"  (1.676 m)   Wt 199 lb 12.8 oz (90.6 kg)   BMI 32.25 kg/m   General: Well-nourished, well-developed in no acute distress.  Eyes: No icterus. Conjunctivae pink. Mouth: Oropharyngeal mucosa moist and pink , no lesions erythema or exudate. Lungs: Clear to auscultation bilaterally. Non-labored. Heart: Regular rate and rhythm, no murmurs rubs or gallops.  Abdomen: Bowel sounds are normal, nontender, nondistended, no hepatosplenomegaly or masses, no abdominal bruits or hernia ,  no rebound or guarding.   Extremities: No lower extremity edema. No clubbing or deformities. Neuro: Alert and oriented x 3.  Grossly intact. Skin: Warm and dry, no jaundice.   Psych: Alert and cooperative, normal mood and affect.   Imaging Studies: Dg Abd 1 View  Result Date: 03/20/2017 CLINICAL DATA:  F blood in stool.  Capsule study. EXAM: ABDOMEN - 1 VIEW COMPARISON:  None. FINDINGS: Metallic capsule projects over the mid sacrum. This may be within the sigmoid colon but could also  be within distal small bowel loops. No evidence of bowel obstruction or free air. No organomegaly. IMPRESSION: Capsule projects over the mid sacrum, possibly within the sigmoid colon although exact location cannot be determined on this single image. Consider continued follow-up. Electronically Signed   By: Rolm Baptise M.D.   On: 03/20/2017 13:51    Assessment and Plan:   Brenda Campbell is a 66 y.o. y/o female is here today for a hospital follow up when she was admitted with a GI bleed which appears to have been from the small bowel . EGD showed gastritis which is positive for H pylori, colonoscopy showed blood in the terminal ileum  And a colon polyp that was not excised . Capsule study did not exit stomach at the end of the study hence was incomplete.   Plan .   1. CBC in a week  2. Endoscopic placement of capsule to visual;ze small bowel .  3. Check H pylori in stool after completing course of antibiotics 4. EGD in 6 months to evaluate for gastric mapping for intestinal metaplasia.  5. Colonoscopy in 3-4 months to excise polyp seen but not excised recently  6. Xray of abdomen to check for checking the exit of the capsule.  Dr [name]  MD Follow up in 8-12 weeks

## 2017-03-24 ENCOUNTER — Other Ambulatory Visit
Admission: RE | Admit: 2017-03-24 | Discharge: 2017-03-24 | Disposition: A | Discharge status: Home / Self Care | Payer: PPO | Source: Ambulatory Visit | Attending: Gastroenterology | Admitting: Gastroenterology

## 2017-03-24 ENCOUNTER — Ambulatory Visit
Admission: RE | Admit: 2017-03-24 | Discharge: 2017-03-24 | Disposition: A | Discharge status: Home / Self Care | Payer: PPO | Source: Ambulatory Visit | Attending: Gastroenterology | Admitting: Gastroenterology

## 2017-03-24 DIAGNOSIS — K921 Melena: Secondary | ICD-10-CM | POA: Diagnosis not present

## 2017-03-24 LAB — CBC WITH DIFFERENTIAL/PLATELET
Basophils Absolute: 0 10*3/uL (ref 0–0.1)
Basophils Relative: 1 %
Eosinophils Absolute: 0.2 10*3/uL (ref 0–0.7)
Eosinophils Relative: 4 %
HCT: 25.8 % — ABNORMAL LOW (ref 35.0–47.0)
Hemoglobin: 8.8 g/dL — ABNORMAL LOW (ref 12.0–16.0)
Lymphocytes Relative: 28 %
Lymphs Abs: 1.7 10*3/uL (ref 1.0–3.6)
MCH: 31.7 pg (ref 26.0–34.0)
MCHC: 34.2 g/dL (ref 32.0–36.0)
MCV: 92.7 fL (ref 80.0–100.0)
Monocytes Absolute: 0.7 10*3/uL (ref 0.2–0.9)
Monocytes Relative: 12 %
Neutro Abs: 3.5 10*3/uL (ref 1.4–6.5)
Neutrophils Relative %: 55 %
Platelets: 377 10*3/uL (ref 150–440)
RBC: 2.78 MIL/uL — ABNORMAL LOW (ref 3.80–5.20)
RDW: 13.8 % (ref 11.5–14.5)
WBC: 6.1 10*3/uL (ref 3.6–11.0)

## 2017-03-27 ENCOUNTER — Other Ambulatory Visit: Payer: Self-pay

## 2017-03-27 ENCOUNTER — Telehealth: Payer: Self-pay | Admitting: Gastroenterology

## 2017-03-27 DIAGNOSIS — K921 Melena: Secondary | ICD-10-CM

## 2017-03-27 NOTE — Telephone Encounter (Signed)
Patient was wondering if she needs to do a repeat procedure. Please call her ASAP to figure out what is going on.

## 2017-03-28 ENCOUNTER — Other Ambulatory Visit: Payer: Self-pay

## 2017-03-30 ENCOUNTER — Ambulatory Visit: Payer: PPO | Admitting: Anesthesiology

## 2017-03-30 ENCOUNTER — Encounter
Admission: RE | Disposition: A | Discharge status: Home / Self Care | Payer: Self-pay | Source: Ambulatory Visit | Attending: Gastroenterology

## 2017-03-30 ENCOUNTER — Encounter: Payer: Self-pay | Admitting: Anesthesiology

## 2017-03-30 ENCOUNTER — Ambulatory Visit
Admission: RE | Admit: 2017-03-30 | Discharge: 2017-03-30 | Disposition: A | Discharge status: Home / Self Care | Payer: PPO | Source: Ambulatory Visit | Attending: Gastroenterology | Admitting: Gastroenterology

## 2017-03-30 DIAGNOSIS — K921 Melena: Secondary | ICD-10-CM | POA: Diagnosis not present

## 2017-03-30 DIAGNOSIS — Z9889 Other specified postprocedural states: Secondary | ICD-10-CM | POA: Diagnosis not present

## 2017-03-30 DIAGNOSIS — F419 Anxiety disorder, unspecified: Secondary | ICD-10-CM | POA: Insufficient documentation

## 2017-03-30 DIAGNOSIS — D649 Anemia, unspecified: Secondary | ICD-10-CM | POA: Diagnosis not present

## 2017-03-30 DIAGNOSIS — K222 Esophageal obstruction: Secondary | ICD-10-CM | POA: Insufficient documentation

## 2017-03-30 DIAGNOSIS — I10 Essential (primary) hypertension: Secondary | ICD-10-CM | POA: Insufficient documentation

## 2017-03-30 DIAGNOSIS — Z79899 Other long term (current) drug therapy: Secondary | ICD-10-CM | POA: Insufficient documentation

## 2017-03-30 DIAGNOSIS — Z9049 Acquired absence of other specified parts of digestive tract: Secondary | ICD-10-CM | POA: Insufficient documentation

## 2017-03-30 DIAGNOSIS — Z87891 Personal history of nicotine dependence: Secondary | ICD-10-CM | POA: Insufficient documentation

## 2017-03-30 DIAGNOSIS — F329 Major depressive disorder, single episode, unspecified: Secondary | ICD-10-CM | POA: Diagnosis not present

## 2017-03-30 DIAGNOSIS — Z903 Acquired absence of stomach [part of]: Secondary | ICD-10-CM | POA: Insufficient documentation

## 2017-03-30 DIAGNOSIS — E039 Hypothyroidism, unspecified: Secondary | ICD-10-CM | POA: Diagnosis not present

## 2017-03-30 DIAGNOSIS — K219 Gastro-esophageal reflux disease without esophagitis: Secondary | ICD-10-CM | POA: Diagnosis not present

## 2017-03-30 HISTORY — PX: ESOPHAGOGASTRODUODENOSCOPY (EGD) WITH PROPOFOL: SHX5813

## 2017-03-30 SURGERY — IMAGING PROCEDURE, GI TRACT, INTRALUMINAL, VIA CAPSULE
Anesthesia: General

## 2017-03-30 SURGERY — ESOPHAGOGASTRODUODENOSCOPY (EGD) WITH PROPOFOL
Anesthesia: General

## 2017-03-30 MED ORDER — GLYCOPYRROLATE 0.2 MG/ML IJ SOLN
INTRAMUSCULAR | Status: DC | PRN
  Administered 2017-03-30: 0.2 mg via INTRAVENOUS

## 2017-03-30 MED ORDER — PROPOFOL 500 MG/50ML IV EMUL
INTRAVENOUS | Status: AC
  Filled 2017-03-30: qty 50

## 2017-03-30 MED ORDER — FENTANYL CITRATE (PF) 100 MCG/2ML IJ SOLN
INTRAMUSCULAR | Status: AC
  Filled 2017-03-30: qty 2

## 2017-03-30 MED ORDER — FENTANYL CITRATE (PF) 100 MCG/2ML IJ SOLN
INTRAMUSCULAR | Status: DC | PRN
  Administered 2017-03-30 (×2): 50 ug via INTRAVENOUS

## 2017-03-30 MED ORDER — PROPOFOL 10 MG/ML IV BOLUS
INTRAVENOUS | Status: DC | PRN
  Administered 2017-03-30: 50 mg via INTRAVENOUS

## 2017-03-30 MED ORDER — PROPOFOL 500 MG/50ML IV EMUL
INTRAVENOUS | Status: DC | PRN
  Administered 2017-03-30: 100 ug/kg/min via INTRAVENOUS

## 2017-03-30 MED ORDER — LIDOCAINE HCL (PF) 2 % IJ SOLN
INTRAMUSCULAR | Status: DC | PRN
  Administered 2017-03-30: 50 mg

## 2017-03-30 MED ORDER — SODIUM CHLORIDE 0.9 % IV SOLN
INTRAVENOUS | Status: DC
  Administered 2017-03-30: 08:00:00 via INTRAVENOUS

## 2017-03-30 NOTE — H&P (Signed)
[name] MD [address], [place], [place] [address] Phone: [phone] Fax : [phone]  Primary Care Physician:  Otilio Miu, MD Primary Gastroenterologist:  Dr. [name]   Pre-Procedure History & Physical: HPI:  Brenda Campbell is a 67 y.o. female is here for an endoscopy.   Past Medical History:  Diagnosis Date  . Anxiety   . Depression   . GERD (gastroesophageal reflux disease)   . Hyperlipidemia   . Hypertension   . Thyroid disease     Past Surgical History:  Procedure Laterality Date  . CESAREAN SECTION     x 1  . COLONOSCOPY  10/12/2014   cleared for 3 years  . COLONOSCOPY N/A 03/16/2017   Procedure: COLONOSCOPY;  Surgeon: Lucilla Lame, MD;  Location: [facility] ENDOSCOPY;  Service: Endoscopy;  Laterality: N/A;  . ESOPHAGOGASTRODUODENOSCOPY (EGD) WITH PROPOFOL N/A 03/15/2017   Procedure: ESOPHAGOGASTRODUODENOSCOPY (EGD) WITH PROPOFOL;  Surgeon: [name], MD;  Location: [facility] ENDOSCOPY;  Service: Endoscopy;  Laterality: N/A;  . GALLBLADDER SURGERY    . GIVENS CAPSULE STUDY N/A 03/17/2017   Procedure: GIVENS CAPSULE STUDY;  Surgeon: Lucilla Lame, MD;  Location: [facility] ENDOSCOPY;  Service: Endoscopy;  Laterality: N/A;  . 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 TIBIA FRACTURE SURGERY Right   . WRIST SURGERY Right    arthritis    Prior to Admission medications   Medication Sig Start Date End Date Taking? Authorizing Provider  amoxicillin (AMOXIL) 500 MG capsule Take 2 capsules (1,000 mg total) by mouth 3 (three) times daily. 03/20/17 04/03/17 Yes [name], MD  clarithromycin (BIAXIN) 500 MG tablet Take 1 tablet (500 mg total) by mouth 2 (two) times daily. 03/20/17 04/03/17 Yes [name], MD  levothyroxine (SYNTHROID, LEVOTHROID) 50 MCG tablet Take 1 tablet (50 mcg total) by mouth daily. 03/13/17  Yes Juline Patch, MD  pantoprazole (PROTONIX) 40 MG tablet Take 1 tablet (40 mg total) by mouth daily. 03/17/17  Yes Demetrios Loll, MD  sertraline (ZOLOFT) 50 MG  tablet Take 1 tablet (50 mg total) by mouth daily. 03/13/17  Yes Juline Patch, MD  ALPRAZolam Duanne Moron) 0.25 MG tablet Take 1 tablet (0.25 mg total) by mouth as needed. 03/13/17   Juline Patch, MD  omeprazole (PRILOSEC) 20 MG capsule Take 1 capsule (20 mg total) by mouth 2 (two) times daily before a meal. Patient not taking: Reported on 03/23/2017 03/20/17 04/03/17  [name], MD  ranitidine ([facility] RANITIDINE) 75 MG tablet Take 1 tablet (75 mg total) by mouth as needed. otc Patient not taking: Reported on 03/23/2017 03/13/17   Juline Patch, MD    Allergies as of 03/27/2017  . (No Known Allergies)    Family History  Problem Relation Age of Onset  . Diabetes Mother   . Heart disease Mother     Social History   Social History  . Marital status: Married    Spouse name: N/A  . Number of children: N/A  . Years of education: N/A   Occupational History  . Not on file.   Social History Main Topics  . Smoking status: Former Research scientist (life sciences)  . Smokeless tobacco: Never Used  . Alcohol use 5.4 oz/week    4 Cans of beer, 5 Shots of liquor per week  . Drug use: No  . Sexual activity: Not Currently   Other Topics Concern  . Not on file   Social History Narrative  . No narrative on file  Review of Systems: See HPI, otherwise negative ROS  Physical Exam: BP 125/73   Pulse (!) 55   Temp 97.1 F (36.2 C) (Tympanic)   Resp 15   Ht 5\' 6"  (1.676 m)   Wt 199 lb (90.3 kg)   SpO2 100%   BMI 32.12 kg/m  General:   Alert,  pleasant and cooperative in NAD Head:  Normocephalic and atraumatic. Neck:  Supple; no masses or thyromegaly. Lungs:  Clear throughout to auscultation.    Heart:  Regular rate and rhythm. Abdomen:  Soft, nontender and nondistended. Normal bowel sounds, without guarding, and without rebound.   Neurologic:  Alert and  oriented x4;  grossly normal neurologically.  Impression/Plan: Brenda Campbell is here for an endoscopy to be performed for endoscopic placement of a  capsule to visualize the small bowel   Risks, benefits, limitations, and alternatives regarding  endoscopy have been reviewed with the patient.  Questions have been answered.  All parties agreeable.   [name], MD  03/30/2017, 7:57 AM

## 2017-03-30 NOTE — Anesthesia Preprocedure Evaluation (Signed)
Anesthesia Evaluation  Patient identified by MRN, date of birth, ID band Patient awake    Reviewed: Allergy & Precautions, H&P , NPO status , Patient's Chart, lab work & pertinent test results, reviewed documented beta blocker date and time   History of Anesthesia Complications Negative for: history of anesthetic complications  Airway Mallampati: II  TM Distance: >3 FB Neck ROM: full    Dental  (+) Caps, Missing, Teeth Intact   Pulmonary neg pulmonary ROS, neg sleep apnea, neg COPD, former smoker,    breath sounds clear to auscultation- rhonchi (-) wheezing      Cardiovascular Exercise Tolerance: Good hypertension, negative cardio ROS   Rhythm:Regular Rate:Normal - Systolic murmurs and - Diastolic murmurs    Neuro/Psych PSYCHIATRIC DISORDERS (Depression) Anxiety Depression negative neurological ROS     GI/Hepatic Neg liver ROS, GERD  Medicated and Controlled,GIB   Endo/Other  neg diabetesHypothyroidism   Renal/GU negative Renal ROS  negative genitourinary   Musculoskeletal negative musculoskeletal ROS (+)   Abdominal (+) + obese,   Peds negative pediatric ROS (+)  Hematology negative hematology ROS (+) anemia ,   Anesthesia Other Findings Past Medical History: No date: Anxiety No date: Depression No date: GERD (gastroesophageal reflux disease) No date: Hyperlipidemia No date: Hypertension No date: Thyroid disease   Reproductive/Obstetrics negative OB ROS                             Anesthesia Physical  Anesthesia Plan  ASA: II  Anesthesia Plan: General   Post-op Pain Management:    Induction: Intravenous  Airway Management Planned: Nasal Cannula  Additional Equipment:   Intra-op Plan:   Post-operative Plan:   Informed Consent: I have reviewed the patients History and Physical, chart, labs and discussed the procedure including the risks, benefits and alternatives for  the proposed anesthesia with the patient or authorized representative who has indicated his/her understanding and acceptance.   Dental Advisory Given  Plan Discussed with: Anesthesiologist, CRNA and Surgeon  Anesthesia Plan Comments:         Anesthesia Quick Evaluation

## 2017-03-30 NOTE — Op Note (Signed)
[facility] Gastroenterology Patient Name: Brenda Campbell Procedure Date: 03/30/2017 7:40 AM MRN: [id number] Account #: [id number] Date of Birth: 06-06-1951 Admit Type: Outpatient Age: 66 Room: [facility] ENDO ROOM 4 Gender: Female Note Status: Finalized Procedure:            Upper GI endoscopy Indications:          Capsule placement due to post-surgical anatomy Providers:            [name] MD, MD Medicines:            Monitored Anesthesia Care Complications:        No immediate complications. Procedure:            Pre-Anesthesia Assessment:                       - ASA Grade Assessment: II - A patient with mild                        systemic disease.                       - Prior to the procedure, a History and Physical was                        performed, and patient medications, allergies and                        sensitivities were reviewed. The patient's tolerance of                        previous anesthesia was reviewed.                       - The risks and benefits of the procedure and the                        sedation options and risks were discussed with the                        patient. All questions were answered and informed                        consent was obtained.                       After obtaining informed consent, the endoscope was                        passed under direct vision. Throughout the procedure,                        the patient's blood pressure, pulse, and oxygen                        saturations were monitored continuously. The Endoscope                        was introduced through the mouth, and advanced to the                        third part of duodenum. The upper GI endoscopy  was                        performed with moderate difficulty due to post-surgical                        anatomy. The patient tolerated the procedure well. Findings:      One mild benign-appearing, intrinsic stenosis was found at the     gastroesophageal junction. And was traversed.      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      The examined duodenum was normal. Using the endoscope, the video capsule       enteroscope was advanced into the duodenal bulb. Impression:           - Benign-appearing esophageal stenosis.                       - Evidence of previous gastric surgery was found,                        characterized by healthy appearing mucosa.                       - Normal examined duodenum.                       - Successful completion of the Video Capsule                        Enteroscope placement.                       - No specimens collected. Recommendation:       - Discharge patient to home (with escort).                       - Return to my office as previously scheduled. Procedure Code(s):    --- Professional ---                       [phone], Esophagogastroduodenoscopy, flexible, transoral;                        diagnostic, including collection of specimen(s) by                        brushing or washing, when performed (separate procedure) Diagnosis Code(s):    --- Professional ---                       K22.2, Esophageal obstruction                       Z98.0, Intestinal bypass and anastomosis status                       Z98.890, Other specified postprocedural states CPT copyright 2016 American Medical Association. All rights reserved. The codes documented in this report are preliminary and upon coder review may  be revised to meet current compliance requirements. [name], MD [name] MD, MD 03/30/2017 8:24:36 AM This report has been signed electronically.  Number of Addenda: 0 Note Initiated On: 03/30/2017 7:40 AM      [facility]

## 2017-03-30 NOTE — Transfer of Care (Signed)
Immediate Anesthesia Transfer of Care Note  Patient: Brenda Campbell  Procedure(s) Performed: Procedure(s) with comments: ESOPHAGOGASTRODUODENOSCOPY (EGD) WITH PROPOFOL (N/A) - Endoscopic Capsule Placement  Patient Location: PACU  Anesthesia Type:General  Level of Consciousness: sedated  Airway & Oxygen Therapy: Patient Spontanous Breathing and Patient connected to nasal cannula oxygen  Post-op Assessment: Report given to RN and Post -op Vital signs reviewed and stable  Post vital signs: Reviewed and stable  Last Vitals:  Vitals:   03/30/17 0723  BP: 125/73  Pulse: (!) 55  Resp: 15  Temp: 36.2 C    Last Pain:  Vitals:   03/30/17 0723  TempSrc: Tympanic         Complications: No apparent anesthesia complications

## 2017-03-30 NOTE — Anesthesia Postprocedure Evaluation (Signed)
Anesthesia Post Note  Patient: [place] Ward Deem  Procedure(s) Performed: Procedure(s) (LRB): ESOPHAGOGASTRODUODENOSCOPY (EGD) WITH PROPOFOL (N/A)  Patient location during evaluation: PACU Anesthesia Type: General Level of consciousness: awake and alert and oriented Pain management: pain level controlled Vital Signs Assessment: post-procedure vital signs reviewed and stable Respiratory status: spontaneous breathing Cardiovascular status: blood pressure returned to baseline Anesthetic complications: no     Last Vitals:  Vitals:   03/30/17 0848 03/30/17 0858  BP: (!) 109/58 (!) 102/53  Pulse: (!) 59 (!) 57  Resp: 13 15  Temp:      Last Pain:  Vitals:   03/30/17 0828  TempSrc: Tympanic                 Heston Widener

## 2017-03-30 NOTE — Anesthesia Post-op Follow-up Note (Cosign Needed)
Anesthesia QCDR form completed.        

## 2017-03-31 ENCOUNTER — Encounter: Payer: Self-pay | Admitting: Gastroenterology

## 2017-04-03 ENCOUNTER — Other Ambulatory Visit: Payer: Self-pay

## 2017-04-03 DIAGNOSIS — A048 Other specified bacterial intestinal infections: Secondary | ICD-10-CM

## 2017-04-04 ENCOUNTER — Telehealth: Payer: Self-pay | Admitting: Gastroenterology

## 2017-04-04 NOTE — Telephone Encounter (Signed)
Patient LVM wanting her results from her procedure.

## 2017-04-11 ENCOUNTER — Telehealth: Payer: Self-pay | Admitting: Gastroenterology

## 2017-04-11 NOTE — Telephone Encounter (Signed)
Made an appt for patient on May 9. She wants to talk to you before then. Please call today

## 2017-04-17 ENCOUNTER — Ambulatory Visit: Payer: PPO | Admitting: Gastroenterology

## 2017-04-19 ENCOUNTER — Ambulatory Visit (INDEPENDENT_AMBULATORY_CARE_PROVIDER_SITE_OTHER): Payer: PPO | Admitting: Gastroenterology

## 2017-04-19 VITALS — BP 155/78 | HR 65 | Temp 97.8°F | Ht 66.0 in | Wt 201.8 lb

## 2017-04-19 DIAGNOSIS — K921 Melena: Secondary | ICD-10-CM | POA: Diagnosis not present

## 2017-04-19 NOTE — Progress Notes (Signed)
Primary Care Physician: Juline Patch, MD  Primary Gastroenterologist:  Dr. [name]   No chief complaint on file.   HPI: Brenda Campbell is a 66 y.o. female   She is here today to follow up to her last visit on 03/23/17 .     Summary of history : She was admitted on 03/15/2017 with melena. It began all of a sudden. I was consulted to see her . I performed an EGD which was negative for an active bleed but showed features of gastritis which on pathology was positive for H pylori as well as focal intestinal metaplasia. We prescribed her H pylori eradication therapy. . A colonoscopy showed blood in the terminal ileum and my plan was to perform a capsule study.   I performed a capsule study prior to discharge but unfortunately it did not exit the stomach in time before she had food ingested and hence visualization was incomplete.   Hb at discharge was 8.9 grams. On 03/20/17 was 8.3 grams.  Interval history   03/23/17-04/19/17  HB on 03/23/17 8.8 grams 03/30/17 - small bowel capsule placed endoscopically which reached probably the mid or distal ileum and the recorder ran out of space to record. Till that point no abnormal lesions were seem , A possible spasm or narrowing was noted at one point.    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 Denies any brown stool or any complaints    Current Outpatient Prescriptions  Medication Sig Dispense Refill  . ALPRAZolam (XANAX) 0.25 MG tablet Take 1 tablet (0.25 mg total) by mouth as needed. 30 tablet 0  . levothyroxine (SYNTHROID, LEVOTHROID) 50 MCG tablet Take 1 tablet (50 mcg total) by mouth daily. 30 tablet 11  . omeprazole (PRILOSEC) 20 MG capsule Take 1 capsule (20 mg total) by mouth 2 (two) times daily before a meal. (Patient not taking: Reported on 03/23/2017) 90 capsule 3  . pantoprazole (PROTONIX) 40 MG tablet Take 1 tablet (40 mg total) by mouth daily. 60 tablet 0  . ranitidine ([facility] RANITIDINE) 75 MG tablet Take 1  tablet (75 mg total) by mouth as needed. otc (Patient not taking: Reported on 03/23/2017) 90 tablet 3  . sertraline (ZOLOFT) 50 MG tablet Take 1 tablet (50 mg total) by mouth daily. 30 tablet 11   No current facility-administered medications for this visit.     Allergies as of 04/19/2017  . (No Known Allergies)    ROS:  General: Negative for anorexia, weight loss, fever, chills, fatigue, weakness. ENT: Negative for hoarseness, difficulty swallowing , nasal congestion. CV: Negative for chest pain, angina, palpitations, dyspnea on exertion, peripheral edema.  Respiratory: Negative for dyspnea at rest, dyspnea on exertion, cough, sputum, wheezing.  GI: See history of present illness. GU:  Negative for dysuria, hematuria, urinary incontinence, urinary frequency, nocturnal urination.  Endo: Negative for unusual weight change.    Physical Examination:   There were no vitals taken for this visit.  General: Well-nourished, well-developed in no acute distress.  Eyes: No icterus. Conjunctivae pink. Mouth: Oropharyngeal mucosa moist and pink , no lesions erythema or exudate. Lungs: Clear to auscultation bilaterally. Non-labored. Heart: Regular rate and rhythm, no murmurs rubs or gallops.  Abdomen: Bowel sounds are normal, nontender, nondistended, no hepatosplenomegaly or masses, no abdominal bruits or hernia , no rebound or guarding.   Extremities: No lower extremity edema. No clubbing or deformities. Neuro: Alert and oriented x 3.  Grossly intact. Skin: Warm and dry, no  jaundice.   Psych: Alert and cooperative, normal mood and affect.   Imaging Studies: Dg Abd 1 View  Result Date: 03/24/2017 CLINICAL DATA:  Evaluation of Givens capsule.  Blood in stool. EXAM: ABDOMEN - 1 VIEW COMPARISON:  None. FINDINGS: The bowel gas pattern is normal. No radio-opaque calculi or other significant radiographic abnormality are seen. IMPRESSION: Negative. The previous visualize capsule has passed, and is no  longer seen. Electronically Signed   By: Staci Righter M.D.   On: 03/24/2017 15:37    Assessment and Plan:   Brenda Ward Brinkmeier is a 66 y.o. y/o female is here today for a hospital follow up when she was admitted with a GI bleed which appears to have been from the small bowel . EGD showed gastritis which is positive for H pylori, colonoscopy showed blood in the terminal ileum  And a colon polyp that was not excised . Capsule study did not exit stomach at the end of the study hence was incomplete.I repeated the study placing it endoscopically and again unfortunately it did not exit the small bowel at the end of the study and hence was again incomplete.    Plan .   1. CBC, x ray abdomen today  2. Endoscopic placement of capsule to visual;ze small bowel with 12 hour study in June as she is away on vacation now. Prior MR enterogram to evaluate small bowel due to history of gist and small bowel resection in the past and posisble mild stricture seen on capsule study  .  3. Check H pylori in stool  4. EGD in 6 months to evaluate for gastric mapping for intestinal metaplasia.  5. Colonoscopy in June July to excise polyp seen but not excised recently    Dr [name]  MD Follow up in 3 months

## 2017-04-20 ENCOUNTER — Encounter: Payer: Self-pay | Admitting: Family Medicine

## 2017-04-20 ENCOUNTER — Ambulatory Visit (INDEPENDENT_AMBULATORY_CARE_PROVIDER_SITE_OTHER): Payer: PPO | Admitting: Family Medicine

## 2017-04-20 ENCOUNTER — Ambulatory Visit
Admission: RE | Admit: 2017-04-20 | Discharge: 2017-04-20 | Disposition: A | Discharge status: Home / Self Care | Payer: PPO | Source: Ambulatory Visit | Attending: Gastroenterology | Admitting: Gastroenterology

## 2017-04-20 ENCOUNTER — Other Ambulatory Visit
Admission: RE | Admit: 2017-04-20 | Discharge: 2017-04-20 | Disposition: A | Discharge status: Home / Self Care | Payer: PPO | Source: Ambulatory Visit | Attending: Gastroenterology | Admitting: Gastroenterology

## 2017-04-20 VITALS — BP 117/76 | HR 66 | Temp 97.7°F | Resp 16 | Ht 66.0 in | Wt 204.0 lb

## 2017-04-20 DIAGNOSIS — N3090 Cystitis, unspecified without hematuria: Secondary | ICD-10-CM

## 2017-04-20 DIAGNOSIS — K921 Melena: Secondary | ICD-10-CM | POA: Diagnosis not present

## 2017-04-20 DIAGNOSIS — N760 Acute vaginitis: Secondary | ICD-10-CM | POA: Diagnosis not present

## 2017-04-20 DIAGNOSIS — K922 Gastrointestinal hemorrhage, unspecified: Secondary | ICD-10-CM | POA: Diagnosis not present

## 2017-04-20 LAB — CBC WITH DIFFERENTIAL/PLATELET
BASOS ABS: 0 10*3/uL (ref 0–0.1)
BASOS PCT: 1 %
Eosinophils Absolute: 0.2 10*3/uL (ref 0–0.7)
Eosinophils Relative: 2 %
HEMATOCRIT: 27.8 % — AB (ref 35.0–47.0)
HEMOGLOBIN: 9.1 g/dL — AB (ref 12.0–16.0)
Lymphocytes Relative: 21 %
Lymphs Abs: 1.6 10*3/uL (ref 1.0–3.6)
MCH: 27.3 pg (ref 26.0–34.0)
MCHC: 32.7 g/dL (ref 32.0–36.0)
MCV: 83.5 fL (ref 80.0–100.0)
Monocytes Absolute: 0.8 10*3/uL (ref 0.2–0.9)
Monocytes Relative: 11 %
NEUTROS ABS: 5.1 10*3/uL (ref 1.4–6.5)
NEUTROS PCT: 65 %
Platelets: 352 10*3/uL (ref 150–440)
RBC: 3.32 MIL/uL — ABNORMAL LOW (ref 3.80–5.20)
RDW: 16 % — ABNORMAL HIGH (ref 11.5–14.5)
WBC: 7.7 10*3/uL (ref 3.6–11.0)

## 2017-04-20 MED ORDER — SULFAMETHOXAZOLE-TRIMETHOPRIM 800-160 MG PO TABS: 1.0000 | ORAL_TABLET | Freq: Two times a day (BID) | ORAL | 0 refills | Status: DC

## 2017-04-20 MED ORDER — FLUCONAZOLE 150 MG PO TABS: 150.0000 mg | ORAL_TABLET | Freq: Once | ORAL | 0 refills | Status: AC

## 2017-04-20 NOTE — Patient Instructions (Signed)
Crohn Disease °Crohn disease is a long-lasting (chronic) disease that affects your gastrointestinal (GI) tract. It often causes irritation and swelling (inflammation) in your small intestine and the beginning of your large intestine. However, it can affect any part of your GI tract. Crohn disease is part of a group of illnesses that are known as inflammatory bowel disease (IBD). °Crohn disease may start slowly and get worse over time. Symptoms may come and go. They may also disappear for months or even years at a time (remission). °What are the causes? °The exact cause of Crohn disease is not known. It may be a response that causes your body's defense system (immune system) to mistakenly attack healthy cells and tissues (autoimmune response). Your genes and your environment may also play a role. °What increases the risk? °You may be at greater risk for Crohn disease if you: °· Have other family members with Crohn disease or another IBD. °· Use any tobacco products, including cigarettes, chewing tobacco, or electronic cigarettes. °· Are in your 20s. °· Have Eastern European ancestry. °What are the signs or symptoms? °The main signs and symptoms of Crohn disease involve your GI tract. These include: °· Diarrhea. °· Rectal bleeding. °· An urgent need to move your bowels. °· The feeling that you are not finished having a bowel movement. °· Abdominal pain or cramping. °· Constipation. °General signs and symptoms of Crohn disease may also include: °· Unexplained weight loss. °· Fatigue. °· Fever. °· Nausea. °· Loss of appetite. °· Joint pain °· Changes in vision. °· Red bumps on your skin. °How is this diagnosed? °Your health care provider may suspect Crohn disease based on your symptoms and your medical history. Your health care provider will do a physical exam. You may need to see a health care provider who specializes in diseases of the digestive tract (gastroenterologist). You may also have tests to help your health  care providers make a diagnosis. These may include: °· Blood tests. °· Stool sample tests. °· Imaging tests, such as X-rays and CT scans. °· Tests to examine the inside of your intestines using a long, flexible tube that has a light and a camera on the end (endoscopy or colonoscopy). °· A procedure to take tissue samples from inside your bowel (biopsy) to be examined under a microscope. °How is this treated? °There is no cure for Crohn disease. Treatment will focus on managing your symptoms. Crohn disease affects each person differently. Your treatment may include: °· Resting your bowels. Drinking only clear liquids or getting nutrition through an IV for a period of time gives your bowels a chance to heal because they are not passing stools. °· Medicines. These may be used alone or in combination (combination therapy). These may include antibiotic medicines. You may be given medicines that help to: °¨ Reduce inflammation. °¨ Control your immune system activity. °¨ Fight infections. °¨ Relieve cramps and prevent diarrhea. °¨ Control your pain. °· Surgery. You may need surgery if: °¨ Medicines and other treatments are no longer working. °¨ You develop complications from severe Crohn disease. °¨ A section of your intestine becomes so damaged that it needs to be removed. °Follow these instructions at home: °· Take medicines only as directed by your health care provider. °· If you were prescribed an antibiotic medicine, finish it all even if you start to feel better. °· Keep all follow-up visits as directed by your health care provider. This is important. °· Talk with your health care provider about changing   your diet. This may help your symptoms. Your health care provide may recommend changes, such as: °¨ Drinking more fluids. °¨ Avoiding milk and other foods that contain lactose. °¨ Eating a low-fat diet. °¨ Avoiding high-fiber foods, such as popcorn and nuts. °¨ Avoiding carbonated beverages, such as soda. °¨ Eating  smaller meals more often rather than eating large meals. °¨ Keeping a food diary to identify foods that make your symptoms better or worse. °· Do not use any tobacco products, including cigarettes, chewing tobacco, or electronic cigarettes. If you need help quitting, ask your health care provider. °· Limit alcohol intake to no more than 1 drink per day for nonpregnant women and 2 drinks per day for men. One drink equals 12 ounces of beer, 5 ounces of wine, or 1½ ounces of hard liquor. °· Exercise daily or as directed by your health care provider. °Contact a health care provider if: °· You have diarrhea, abdominal cramps, and other gastrointestinal problems that are present almost all of the time. °· Your symptoms do not improve with treatment. °· You continue to lose weight. °· You develop a rash or sores on your skin. °· You develop eye problems. °· You have a fever. °· Your symptoms get worse. °· You develop new symptoms. °Get help right away if: °· You have bloody diarrhea. °· You develop severe abdominal pain. °· You cannot pass stools. °This information is not intended to replace advice given to you by your health care provider. Make sure you discuss any questions you have with your health care provider. °Document Released: 09/07/2005 Document Revised: 04/07/2016 Document Reviewed: 07/16/2014 °[employer]vier Interactive Patient Education © 2017 [employer] ° °

## 2017-04-20 NOTE — Progress Notes (Signed)
Name: Brenda Campbell   MRN: [id number]    DOB: 09/23/1951   Date:04/21/2017       Progress Note  Subjective  Chief Complaint  Chief Complaint  Patient presents with  . Urinary Tract Infection    dysuria x 3 days has been on several Abx from intestinal bleed and did have yeast infection.     Urinary Tract Infection   This is a new problem. The current episode started in the past 7 days. The problem occurs intermittently. The problem has been gradually worsening. The quality of the pain is described as burning. The pain is at a severity of 2/10. The pain is mild. Associated symptoms include frequency and urgency. Pertinent negatives include no chills, discharge, flank pain, hematuria, hesitancy, nausea, possible pregnancy or sweats. She has tried nothing (azo) for the symptoms. The treatment provided mild relief. There is no history of catheterization, kidney stones, recurrent UTIs, a single kidney, urinary stasis or a urological procedure.  Vaginal Itching  The patient's primary symptoms include genital itching. This is a new problem. The current episode started today. The problem occurs constantly. The problem has been gradually worsening. Associated symptoms include frequency and urgency. Pertinent negatives include no back pain, chills, constipation, dysuria, fever, flank pain, hematuria, joint pain or nausea.    No problem-specific Assessment & Plan notes found for this encounter.   Past Medical History:  Diagnosis Date  . Anxiety   . Depression   . GERD (gastroesophageal reflux disease)   . Hyperlipidemia   . Hypertension   . Thyroid disease     Past Surgical History:  Procedure Laterality Date  . CESAREAN SECTION     x 1  . COLONOSCOPY  10/12/2014   cleared for 3 years  . COLONOSCOPY N/A 03/16/2017   Procedure: COLONOSCOPY;  Surgeon: Lucilla Lame, MD;  Location: [facility] ENDOSCOPY;  Service: Endoscopy;  Laterality: N/A;  . ESOPHAGOGASTRODUODENOSCOPY (EGD) WITH PROPOFOL N/A  03/15/2017   Procedure: ESOPHAGOGASTRODUODENOSCOPY (EGD) WITH PROPOFOL;  Surgeon: [name], MD;  Location: [facility] ENDOSCOPY;  Service: Endoscopy;  Laterality: N/A;  . ESOPHAGOGASTRODUODENOSCOPY (EGD) WITH PROPOFOL N/A 03/30/2017   Procedure: ESOPHAGOGASTRODUODENOSCOPY (EGD) WITH PROPOFOL;  Surgeon: [name], MD;  Location: [facility]OSCOPY;  Service: Endoscopy;  Laterality: N/A;  Endoscopic Capsule Placement  . GALLBLADDER SURGERY    . GIVENS CAPSULE STUDY N/A 03/17/2017   Procedure: GIVENS CAPSULE STUDY;  Surgeon: Lucilla Lame, MD;  Location: [facility] ENDOSCOPY;  Service: Endoscopy;  Laterality: N/A;  . 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 TIBIA FRACTURE SURGERY Right   . WRIST SURGERY Right    arthritis    Family History  Problem Relation Age of Onset  . Diabetes Mother   . Heart disease Mother     Social History   Social History  . Marital status: Married    Spouse name: N/A  . Number of children: N/A  . Years of education: N/A   Occupational History  . Not on file.   Social History Main Topics  . Smoking status: Former Research scientist (life sciences)  . Smokeless tobacco: Never Used  . Alcohol use 5.4 oz/week    4 Cans of beer, 5 Shots of liquor per week  . Drug use: No  . Sexual activity: Not Currently   Other Topics Concern  . Not on file   Social History Narrative  . No narrative on file    No Known Allergies  Outpatient Medications Prior to Visit  Medication Sig  Dispense Refill  . ALPRAZolam (XANAX) 0.25 MG tablet Take 1 tablet (0.25 mg total) by mouth as needed. 30 tablet 0  . levothyroxine (SYNTHROID, LEVOTHROID) 50 MCG tablet Take 1 tablet (50 mcg total) by mouth daily. 30 tablet 11  . pantoprazole (PROTONIX) 40 MG tablet Take 1 tablet (40 mg total) by mouth daily. 60 tablet 0  . ranitidine ([facility] RANITIDINE) 75 MG tablet Take 1 tablet (75 mg total) by mouth as needed. otc 90 tablet 3  . sertraline (ZOLOFT) 50 MG tablet Take 1 tablet (50 mg total) by mouth daily. 30 tablet 11  .  omeprazole (PRILOSEC) 20 MG capsule Take 1 capsule (20 mg total) by mouth 2 (two) times daily before a meal. (Patient not taking: Reported on 03/23/2017) 90 capsule 3   No facility-administered medications prior to visit.     Review of Systems  Constitutional: Negative for chills, fever, malaise/fatigue and weight loss.  HENT: Negative for ear pain.   Eyes: Negative for blurred vision.  Respiratory: Negative for sputum production, shortness of breath and wheezing.   Cardiovascular: Negative for chest pain, palpitations and leg swelling.  Gastrointestinal: Negative for blood in stool, constipation, heartburn, melena and nausea.  Genitourinary: Positive for frequency and urgency. Negative for dysuria, flank pain, hematuria and hesitancy.  Musculoskeletal: Negative for back pain, joint pain and myalgias.  Neurological: Negative for dizziness, tingling, sensory change and focal weakness.  Endo/Heme/Allergies: Negative for environmental allergies and polydipsia. Does not bruise/bleed easily.  Psychiatric/Behavioral: Negative for depression and suicidal ideas. The patient is not nervous/anxious and does not have insomnia.      Objective  Vitals:   04/20/17 1602  BP: 117/76  Pulse: 66  Resp: 16  Temp: 97.7 F (36.5 C)  SpO2: 99%  Weight: 204 lb (92.5 kg)  Height: 5\' 6"  (1.676 m)    Physical Exam  Constitutional: She is well-developed, well-nourished, and in no distress. No distress.  HENT:  Head: Normocephalic and atraumatic.  Right Ear: External ear normal.  Left Ear: External ear normal.  Nose: Nose normal.  Mouth/Throat: Oropharynx is clear and moist.  Eyes: Conjunctivae and EOM are normal. Pupils are equal, round, and reactive to light. Right eye exhibits no discharge. Left eye exhibits no discharge.  Neck: Normal range of motion. Neck supple. No JVD present. No thyromegaly present.  Cardiovascular: Normal rate, regular rhythm, normal heart sounds and intact distal pulses.   Exam reveals no gallop and no friction rub.   No murmur heard. Pulmonary/Chest: Effort normal and breath sounds normal. She has no wheezes. She has no rales. She exhibits no tenderness.  Abdominal: Soft. Bowel sounds are normal. She exhibits no mass. There is tenderness in the suprapubic area. There is no rebound and no guarding.  Musculoskeletal: Normal range of motion. She exhibits no edema.  Lymphadenopathy:    She has no cervical adenopathy.  Neurological: She is alert. She has normal reflexes.  Skin: Skin is warm and dry. She is not diaphoretic.  Psychiatric: Mood and affect normal.  Nursing note and vitals reviewed.     Assessment & Plan  Problem List Items Addressed This Visit    None    Visit Diagnoses    Cystitis    -  Primary   Relevant Medications   sulfamethoxazole-trimethoprim (BACTRIM DS,SEPTRA DS) 800-160 MG tablet   Acute vaginitis          Meds ordered this encounter  Medications  . sulfamethoxazole-trimethoprim (BACTRIM DS,SEPTRA DS) 800-160 MG tablet  Sig: Take 1 tablet by mouth 2 (two) times daily.    Dispense:  6 tablet    Refill:  0  . fluconazole (DIFLUCAN) 150 MG tablet    Sig: Take 1 tablet (150 mg total) by mouth once.    Dispense:  1 tablet    Refill:  0      Dr. Otilio Miu [facility]  04/21/17

## 2017-04-21 ENCOUNTER — Encounter: Payer: Self-pay | Admitting: Gastroenterology

## 2017-04-28 ENCOUNTER — Telehealth: Payer: Self-pay

## 2017-04-28 NOTE — Telephone Encounter (Signed)
-----   Message from [name], MD sent at 04/24/2017 11:01 AM EDT ----- Inform Hb stable, continue iron tablets and rest of the plan per my lastoffice note - recheck Hb in 6-8 weeks

## 2017-04-28 NOTE — Telephone Encounter (Signed)
Advised pt of results per Dr. Vicente Males. Pt to purchase Iron OTC.  Inform Hb stable, continue iron tablets and rest of the plan per my last office note - recheck Hb in 6-8 weeks

## 2017-05-02 ENCOUNTER — Telehealth: Payer: Self-pay | Admitting: Gastroenterology

## 2017-05-02 NOTE — Telephone Encounter (Signed)
Patient stated she needs an appt for and EGD. Please call

## 2017-05-04 ENCOUNTER — Other Ambulatory Visit: Payer: Self-pay

## 2017-05-04 ENCOUNTER — Telehealth: Payer: Self-pay

## 2017-05-04 DIAGNOSIS — N3090 Cystitis, unspecified without hematuria: Secondary | ICD-10-CM

## 2017-05-04 MED ORDER — SULFAMETHOXAZOLE-TRIMETHOPRIM 800-160 MG PO TABS: 1.0000 | ORAL_TABLET | Freq: Two times a day (BID) | ORAL | 0 refills | Status: DC

## 2017-05-04 NOTE — Telephone Encounter (Signed)
Spoke to patient about Dr. Georgeann Oppenheim plan.  She would like to take each step at a time.   1st. Endoscopic placement of 12-hour capsule study  2nd. Colonoscopy  3rd. EGD in November.   Will call patient on Tuesday, May 29th with date from [place].

## 2017-05-04 NOTE — Telephone Encounter (Signed)
Called into [place] a refill on Septra DS due to pt called stating that the 3 days of antibiotic for UTI were not enough- wanted refill- was told if doesn't clear up- need to see

## 2017-05-11 ENCOUNTER — Telehealth: Payer: Self-pay | Admitting: Gastroenterology

## 2017-05-11 NOTE — Telephone Encounter (Signed)
Patient left a voice message that she needs to schedule for further testing and still waiting to hear from you. She said she indicated that she has a tight schedule for June. Please call

## 2017-05-12 ENCOUNTER — Other Ambulatory Visit: Payer: Self-pay

## 2017-05-12 DIAGNOSIS — K921 Melena: Secondary | ICD-10-CM

## 2017-05-18 ENCOUNTER — Ambulatory Visit: Payer: PPO | Admitting: Gastroenterology

## 2017-05-23 ENCOUNTER — Telehealth: Payer: Self-pay

## 2017-05-23 ENCOUNTER — Other Ambulatory Visit: Payer: Self-pay

## 2017-05-23 DIAGNOSIS — Z8509 Personal history of malignant neoplasm of other digestive organs: Secondary | ICD-10-CM

## 2017-05-23 DIAGNOSIS — K222 Esophageal obstruction: Secondary | ICD-10-CM

## 2017-05-23 DIAGNOSIS — Z9049 Acquired absence of other specified parts of digestive tract: Secondary | ICD-10-CM

## 2017-05-23 NOTE — Telephone Encounter (Signed)
Patient needed prep information for 12-hr capsule study.  Emailed letter to patient.   [facility] Ward Marton                         DOB 05-02-1951 MRN [id number] Procedure Date: Thursday, 4/19    Location of Procedure: [facility]   CAPSULE ENDOSCOPY: PATIENT INSTRUCTION SHEET  If taking any Iron Supplements, STOP TAKING If taking Carafate (Sucralfate), STOP TAKING If taking Aspirin, STOP TAKING If taking any Arthritis medication, STOP TAKING   1 DAY BEFORE PROCEDURE:   DATE: 4/18   DAY: Wednesday If a smoker, STOP SMOKING  Eat a light breakfast / early lunch AFTER your lunch meal (12:00pm) Start clear liquid diet Do not eat any solid foods, including such foods as: cereal, oatmeal, yogurt, fruits, vegetables, creamed soups, eggs, bread, etc.   CLEAR LIQUIDS INCLUDE: Water Jello (NOT red in color)   Ice Popsicles (NOT red in color)   Tea (sugar ok, no milk/cream) Powdered fruit flavored drinks  Coffee (sugar ok, no milk/cream) Carbonated beverages (any kind)  Juice: apple, white grape, white cranberry Soft drinks  Clear bullion, consomme, broth (fat free beef/chicken/vegetable)  Gatorade/ Lemonade/ Kool-Aid  (NOT red in color)   Strained chicken noodle soup Hard Candy   DO NOT EAT OR DRINK ANY OF THE FOLLOWING: Dairy products of any kind                              Cranberry juice Tomato juice / V8 juice                                   Grapefruit juice Orange juice                                                    Red grape juice  At 6:00pm the evening before: Mix 7 capfuls (105 grams) of Miralax with 32 ounces of Gatorade. Drink 8 ounces avery 15 minutes until gone.      Nothing to eat or drink after midnight.   DAY OF PROCEDURE:   DATE: Thursday, 4/196     DAY:  Thursday  Do not take any medications within two hours of test.  Clothing: Wear loose two piece clothing to your exam.    [place], [phone]                        2

## 2017-05-25 ENCOUNTER — Ambulatory Visit: Admission: RE | Admit: 2017-05-25 | Payer: PPO | Source: Ambulatory Visit | Admitting: Gastroenterology

## 2017-05-25 ENCOUNTER — Encounter: Admission: RE | Payer: Self-pay | Source: Ambulatory Visit

## 2017-05-25 SURGERY — IMAGING PROCEDURE, GI TRACT, INTRALUMINAL, VIA CAPSULE
Anesthesia: General

## 2017-05-27 ENCOUNTER — Ambulatory Visit: Admission: RE | Admit: 2017-05-27 | Payer: PPO | Source: Ambulatory Visit

## 2017-06-05 ENCOUNTER — Other Ambulatory Visit: Payer: Self-pay

## 2017-06-05 ENCOUNTER — Telehealth: Payer: Self-pay | Admitting: Gastroenterology

## 2017-06-05 DIAGNOSIS — Z9049 Acquired absence of other specified parts of digestive tract: Secondary | ICD-10-CM

## 2017-06-05 NOTE — Telephone Encounter (Signed)
Pt has been scheduled MRI Enterogram for Monday July 9th.  She needs to arrive at 9:30am (11:00am appt) nothing to eat or drink 4 hours prior to procedure. These instructions have been left on her at home voice mail.  She has been asked to contact office to confirm she received message.

## 2017-06-05 NOTE — Telephone Encounter (Signed)
Patient LVM and is calling regarding MRI. She will be out of town the week of the fourth. Some good time would be 7/9th, 11th, 13th or 14th.

## 2017-06-19 ENCOUNTER — Ambulatory Visit
Admission: RE | Admit: 2017-06-19 | Discharge: 2017-06-19 | Disposition: A | Discharge status: Home / Self Care | Payer: PPO | Source: Ambulatory Visit | Attending: Gastroenterology | Admitting: Gastroenterology

## 2017-06-19 DIAGNOSIS — Z8509 Personal history of malignant neoplasm of other digestive organs: Secondary | ICD-10-CM | POA: Insufficient documentation

## 2017-06-19 DIAGNOSIS — K222 Esophageal obstruction: Secondary | ICD-10-CM | POA: Diagnosis not present

## 2017-06-19 DIAGNOSIS — Z9049 Acquired absence of other specified parts of digestive tract: Secondary | ICD-10-CM | POA: Diagnosis not present

## 2017-06-19 LAB — POCT I-STAT CREATININE: Creatinine, Ser: 0.7 mg/dL (ref 0.44–1.00)

## 2017-06-19 MED ORDER — GADOBENATE DIMEGLUMINE 529 MG/ML IV SOLN
19.0000 mL | Freq: Once | INTRAVENOUS | Status: AC | PRN
  Administered 2017-06-19: 19 mL via INTRAVENOUS

## 2017-06-22 ENCOUNTER — Encounter: Payer: Self-pay | Admitting: Gastroenterology

## 2017-06-28 ENCOUNTER — Encounter: Payer: Self-pay | Admitting: Gastroenterology

## 2017-06-28 ENCOUNTER — Other Ambulatory Visit: Payer: Self-pay

## 2017-06-28 DIAGNOSIS — K921 Melena: Secondary | ICD-10-CM | POA: Insufficient documentation

## 2017-06-29 ENCOUNTER — Telehealth: Payer: Self-pay | Admitting: Gastroenterology

## 2017-06-29 NOTE — Telephone Encounter (Signed)
06/29/17 [employer] website   NO prior Josem Kaufmann is required for  EGD 43235 / Capsule Study 18590, K92.1 Vicente Males, [facility], 08/18/17)

## 2017-08-17 ENCOUNTER — Telehealth: Payer: Self-pay | Admitting: Gastroenterology

## 2017-08-17 NOTE — Telephone Encounter (Signed)
Returned a call to patient. She had left a voice message for instructions and arrival time. I told her for an EGD nothing to eat or drink after midnight and gave her the number to Endo to call for her arrival time.

## 2017-08-18 ENCOUNTER — Ambulatory Visit
Admission: RE | Admit: 2017-08-18 | Discharge: 2017-08-18 | Disposition: A | Discharge status: Home / Self Care | Payer: PPO | Source: Ambulatory Visit | Attending: Gastroenterology | Admitting: Gastroenterology

## 2017-08-18 ENCOUNTER — Encounter: Payer: Self-pay | Admitting: *Deleted

## 2017-08-18 ENCOUNTER — Ambulatory Visit: Payer: PPO | Admitting: Anesthesiology

## 2017-08-18 ENCOUNTER — Encounter
Admission: RE | Disposition: A | Discharge status: Home / Self Care | Payer: Self-pay | Source: Ambulatory Visit | Attending: Gastroenterology

## 2017-08-18 DIAGNOSIS — F329 Major depressive disorder, single episode, unspecified: Secondary | ICD-10-CM | POA: Insufficient documentation

## 2017-08-18 DIAGNOSIS — Z539 Procedure and treatment not carried out, unspecified reason: Secondary | ICD-10-CM | POA: Insufficient documentation

## 2017-08-18 DIAGNOSIS — F419 Anxiety disorder, unspecified: Secondary | ICD-10-CM | POA: Insufficient documentation

## 2017-08-18 DIAGNOSIS — K219 Gastro-esophageal reflux disease without esophagitis: Secondary | ICD-10-CM | POA: Insufficient documentation

## 2017-08-18 DIAGNOSIS — E785 Hyperlipidemia, unspecified: Secondary | ICD-10-CM | POA: Diagnosis not present

## 2017-08-18 DIAGNOSIS — K921 Melena: Secondary | ICD-10-CM

## 2017-08-18 DIAGNOSIS — I10 Essential (primary) hypertension: Secondary | ICD-10-CM | POA: Diagnosis not present

## 2017-08-18 LAB — CBC WITH DIFFERENTIAL/PLATELET
BASOS PCT: 1 %
Basophils Absolute: 0 10*3/uL (ref 0–0.1)
EOS ABS: 0.2 10*3/uL (ref 0–0.7)
Eosinophils Relative: 3 %
HCT: 36.6 % (ref 35.0–47.0)
HEMOGLOBIN: 12.4 g/dL (ref 12.0–16.0)
Lymphocytes Relative: 29 %
Lymphs Abs: 1.5 10*3/uL (ref 1.0–3.6)
MCH: 28.6 pg (ref 26.0–34.0)
MCHC: 33.8 g/dL (ref 32.0–36.0)
MCV: 84.6 fL (ref 80.0–100.0)
Monocytes Absolute: 0.5 10*3/uL (ref 0.2–0.9)
Monocytes Relative: 10 %
Neutro Abs: 2.9 10*3/uL (ref 1.4–6.5)
Neutrophils Relative %: 57 %
Platelets: 255 10*3/uL (ref 150–440)
RBC: 4.33 MIL/uL (ref 3.80–5.20)
RDW: 17.9 % — ABNORMAL HIGH (ref 11.5–14.5)
WBC: 5.1 10*3/uL (ref 3.6–11.0)

## 2017-08-18 SURGERY — ESOPHAGOGASTRODUODENOSCOPY (EGD) WITH PROPOFOL
Anesthesia: General

## 2017-08-18 MED ORDER — LIDOCAINE HCL (PF) 2 % IJ SOLN
INTRAMUSCULAR | Status: AC
  Filled 2017-08-18: qty 2

## 2017-08-18 MED ORDER — FENTANYL CITRATE (PF) 100 MCG/2ML IJ SOLN
INTRAMUSCULAR | Status: AC
  Filled 2017-08-18: qty 2

## 2017-08-18 MED ORDER — MIDAZOLAM HCL 2 MG/2ML IJ SOLN
INTRAMUSCULAR | Status: AC
  Filled 2017-08-18: qty 2

## 2017-08-18 MED ORDER — PROPOFOL 500 MG/50ML IV EMUL
INTRAVENOUS | Status: AC
  Filled 2017-08-18: qty 150

## 2017-08-18 MED ORDER — SODIUM CHLORIDE 0.9 % IV SOLN
INTRAVENOUS | Status: DC
  Administered 2017-08-18: 07:00:00 via INTRAVENOUS

## 2017-08-18 MED ORDER — PHENYLEPHRINE HCL 10 MG/ML IJ SOLN
INTRAMUSCULAR | Status: AC
  Filled 2017-08-18: qty 1

## 2017-08-18 NOTE — Anesthesia Preprocedure Evaluation (Signed)
Anesthesia Evaluation  Patient identified by MRN, date of birth, ID band Patient awake    Reviewed: Allergy & Precautions, H&P , NPO status , Patient's Chart, lab work & pertinent test results  History of Anesthesia Complications Negative for: history of anesthetic complications  Airway Mallampati: III  TM Distance: <3 FB Neck ROM: full    Dental  (+) Poor Dentition, Chipped, Missing   Pulmonary neg shortness of breath, former smoker,           Cardiovascular Exercise Tolerance: Good hypertension, (-) angina(-) Past MI and (-) DOE      Neuro/Psych PSYCHIATRIC DISORDERS Anxiety Depression negative neurological ROS  negative psych ROS   GI/Hepatic Neg liver ROS, GERD  Medicated and Controlled,  Endo/Other  Hypothyroidism   Renal/GU negative Renal ROS  negative genitourinary   Musculoskeletal   Abdominal   Peds  Hematology negative hematology ROS (+)   Anesthesia Other Findings Past Medical History: No date: Anxiety No date: Depression No date: GERD (gastroesophageal reflux disease) No date: Hyperlipidemia No date: Hypertension No date: Thyroid disease  Past Surgical History: No date: CESAREAN SECTION     Comment:  x 1 10/12/2014: COLONOSCOPY     Comment:  cleared for 3 years 03/16/2017: COLONOSCOPY; N/A     Comment:  Procedure: COLONOSCOPY;  Surgeon: Lucilla Lame, MD;                Location: [facility] ENDOSCOPY;  Service: Endoscopy;                Laterality: N/A; 03/15/2017: ESOPHAGOGASTRODUODENOSCOPY (EGD) WITH PROPOFOL; N/A     Comment:  Procedure: ESOPHAGOGASTRODUODENOSCOPY (EGD) WITH               PROPOFOL;  Surgeon: [name], MD;  Location: [facility]               ENDOSCOPY;  Service: Endoscopy;  Laterality: N/A; 03/30/2017: ESOPHAGOGASTRODUODENOSCOPY (EGD) WITH PROPOFOL; N/A     Comment:  Procedure: ESOPHAGOGASTRODUODENOSCOPY (EGD) WITH               PROPOFOL;  Surgeon: [name], MD;  Location: [facility]        ENDOSCOPY;  Service: Endoscopy;  Laterality: N/A;                Endoscopic Capsule Placement No date: GALLBLADDER SURGERY 03/17/2017: GIVENS CAPSULE STUDY; N/A     Comment:  Procedure: GIVENS CAPSULE STUDY;  Surgeon: Lucilla Lame,               MD;  Location: [facility] ENDOSCOPY;  Service: Endoscopy;                Laterality: N/A; No date: THROAT SURGERY     Comment:  nodule on vocal chord No date: TIBIA FRACTURE SURGERY; Right No date: WRIST SURGERY; Right     Comment:  arthritis  BMI    Body Mass Index:  32.28 kg/m      Reproductive/Obstetrics negative OB ROS                             Anesthesia Physical Anesthesia Plan  ASA: III  Anesthesia Plan: General   Post-op Pain Management:    Induction: Intravenous  PONV Risk Score and Plan: Propofol infusion  Airway Management Planned: Natural Airway and Nasal Cannula  Additional Equipment:   Intra-op Plan:   Post-operative Plan:   Informed Consent: I have reviewed the patients History and Physical,  chart, labs and discussed the procedure including the risks, benefits and alternatives for the proposed anesthesia with the patient or authorized representative who has indicated his/her understanding and acceptance.   Dental Advisory Given  Plan Discussed with: Anesthesiologist, CRNA and Surgeon  Anesthesia Plan Comments: (Patient consented for risks of anesthesia including but not limited to:  - adverse reactions to medications - risk of intubation if required - damage to teeth, lips or other oral mucosa - sore throat or hoarseness - Damage to heart, brain, lungs or loss of life  Patient voiced understanding.)        Anesthesia Quick Evaluation

## 2017-08-21 ENCOUNTER — Telehealth: Payer: Self-pay

## 2017-08-21 NOTE — Telephone Encounter (Signed)
Advised patient of results per Dr. Vicente Males.   Pt to have EGD and colonoscopy due to polyp found.   Patient will call to reschedule for November.

## 2017-08-21 NOTE — Telephone Encounter (Signed)
-----   Message from [name], MD sent at 08/18/2017  8:31 AM EDT ----- Inform CBC is normal - needs capsule study but not immediately can be done in 2-3 months

## 2017-11-06 ENCOUNTER — Telehealth: Payer: Self-pay | Admitting: Gastroenterology

## 2017-11-06 NOTE — Telephone Encounter (Signed)
Patient called and is ready to schedule her procedures.

## 2017-11-07 ENCOUNTER — Encounter: Payer: Self-pay | Admitting: Gastroenterology

## 2017-11-07 ENCOUNTER — Other Ambulatory Visit: Payer: Self-pay

## 2017-11-07 ENCOUNTER — Telehealth: Payer: Self-pay | Admitting: Gastroenterology

## 2017-11-07 DIAGNOSIS — K921 Melena: Secondary | ICD-10-CM

## 2017-11-07 MED ORDER — PEG 3350-KCL-NA BICARB-NACL 420 G PO SOLR: 4000.0000 mL | Freq: Once | ORAL | 0 refills | Status: AC

## 2017-11-07 NOTE — Telephone Encounter (Signed)
Patient called again wanting to schedule her procedures.

## 2017-12-01 ENCOUNTER — Encounter: Payer: Self-pay | Admitting: Anesthesiology

## 2017-12-01 ENCOUNTER — Ambulatory Visit: Payer: PPO | Admitting: Anesthesiology

## 2017-12-01 ENCOUNTER — Ambulatory Visit
Admission: RE | Admit: 2017-12-01 | Discharge: 2017-12-01 | Disposition: A | Discharge status: Home / Self Care | Payer: PPO | Source: Ambulatory Visit | Attending: Gastroenterology | Admitting: Gastroenterology

## 2017-12-01 ENCOUNTER — Encounter
Admission: RE | Disposition: A | Discharge status: Home / Self Care | Payer: Self-pay | Source: Ambulatory Visit | Attending: Gastroenterology

## 2017-12-01 DIAGNOSIS — K921 Melena: Secondary | ICD-10-CM | POA: Diagnosis not present

## 2017-12-01 DIAGNOSIS — K5730 Diverticulosis of large intestine without perforation or abscess without bleeding: Secondary | ICD-10-CM | POA: Diagnosis not present

## 2017-12-01 DIAGNOSIS — D126 Benign neoplasm of colon, unspecified: Secondary | ICD-10-CM | POA: Diagnosis not present

## 2017-12-01 DIAGNOSIS — F419 Anxiety disorder, unspecified: Secondary | ICD-10-CM | POA: Diagnosis not present

## 2017-12-01 DIAGNOSIS — K648 Other hemorrhoids: Secondary | ICD-10-CM | POA: Diagnosis not present

## 2017-12-01 DIAGNOSIS — D124 Benign neoplasm of descending colon: Secondary | ICD-10-CM | POA: Insufficient documentation

## 2017-12-01 DIAGNOSIS — I10 Essential (primary) hypertension: Secondary | ICD-10-CM | POA: Insufficient documentation

## 2017-12-01 DIAGNOSIS — E039 Hypothyroidism, unspecified: Secondary | ICD-10-CM | POA: Insufficient documentation

## 2017-12-01 DIAGNOSIS — K640 First degree hemorrhoids: Secondary | ICD-10-CM | POA: Insufficient documentation

## 2017-12-01 DIAGNOSIS — D123 Benign neoplasm of transverse colon: Secondary | ICD-10-CM | POA: Diagnosis not present

## 2017-12-01 DIAGNOSIS — D122 Benign neoplasm of ascending colon: Secondary | ICD-10-CM | POA: Diagnosis not present

## 2017-12-01 DIAGNOSIS — Z79899 Other long term (current) drug therapy: Secondary | ICD-10-CM | POA: Diagnosis not present

## 2017-12-01 DIAGNOSIS — E785 Hyperlipidemia, unspecified: Secondary | ICD-10-CM | POA: Diagnosis not present

## 2017-12-01 DIAGNOSIS — Z86010 Personal history of colonic polyps: Secondary | ICD-10-CM | POA: Insufficient documentation

## 2017-12-01 DIAGNOSIS — M1990 Unspecified osteoarthritis, unspecified site: Secondary | ICD-10-CM | POA: Insufficient documentation

## 2017-12-01 DIAGNOSIS — K5790 Diverticulosis of intestine, part unspecified, without perforation or abscess without bleeding: Secondary | ICD-10-CM | POA: Diagnosis not present

## 2017-12-01 DIAGNOSIS — K922 Gastrointestinal hemorrhage, unspecified: Secondary | ICD-10-CM | POA: Diagnosis not present

## 2017-12-01 DIAGNOSIS — F329 Major depressive disorder, single episode, unspecified: Secondary | ICD-10-CM | POA: Diagnosis not present

## 2017-12-01 DIAGNOSIS — Z833 Family history of diabetes mellitus: Secondary | ICD-10-CM | POA: Diagnosis not present

## 2017-12-01 DIAGNOSIS — F418 Other specified anxiety disorders: Secondary | ICD-10-CM | POA: Diagnosis not present

## 2017-12-01 DIAGNOSIS — K219 Gastro-esophageal reflux disease without esophagitis: Secondary | ICD-10-CM | POA: Diagnosis not present

## 2017-12-01 DIAGNOSIS — Z8249 Family history of ischemic heart disease and other diseases of the circulatory system: Secondary | ICD-10-CM | POA: Insufficient documentation

## 2017-12-01 DIAGNOSIS — K635 Polyp of colon: Secondary | ICD-10-CM | POA: Diagnosis not present

## 2017-12-01 DIAGNOSIS — E782 Mixed hyperlipidemia: Secondary | ICD-10-CM | POA: Diagnosis not present

## 2017-12-01 HISTORY — PX: GIVENS CAPSULE STUDY: SHX5432

## 2017-12-01 HISTORY — PX: ESOPHAGOGASTRODUODENOSCOPY (EGD) WITH PROPOFOL: SHX5813

## 2017-12-01 HISTORY — PX: COLONOSCOPY WITH PROPOFOL: SHX5780

## 2017-12-01 SURGERY — COLONOSCOPY WITH PROPOFOL
Anesthesia: General

## 2017-12-01 MED ORDER — SODIUM CHLORIDE 0.9 % IV SOLN
INTRAVENOUS | Status: DC
  Administered 2017-12-01: 08:00:00 via INTRAVENOUS

## 2017-12-01 MED ORDER — PROPOFOL 10 MG/ML IV BOLUS
INTRAVENOUS | Status: DC | PRN
  Administered 2017-12-01: 100 mg via INTRAVENOUS

## 2017-12-01 MED ORDER — LIDOCAINE HCL (CARDIAC) 20 MG/ML IV SOLN
INTRAVENOUS | Status: DC | PRN
  Administered 2017-12-01: 100 mg via INTRAVENOUS

## 2017-12-01 MED ORDER — ONDANSETRON HCL 4 MG/2ML IJ SOLN: 4.0000 mg | Freq: Once | INTRAMUSCULAR | Status: DC | PRN

## 2017-12-01 MED ORDER — PROPOFOL 500 MG/50ML IV EMUL
INTRAVENOUS | Status: DC | PRN
  Administered 2017-12-01: 150 ug/kg/min via INTRAVENOUS

## 2017-12-01 MED ORDER — FENTANYL CITRATE (PF) 100 MCG/2ML IJ SOLN: 25.0000 ug | INTRAMUSCULAR | Status: DC | PRN

## 2017-12-01 MED ORDER — PROPOFOL 500 MG/50ML IV EMUL
INTRAVENOUS | Status: AC
  Filled 2017-12-01: qty 50

## 2017-12-01 MED ORDER — LIDOCAINE HCL (PF) 2 % IJ SOLN
INTRAMUSCULAR | Status: AC
  Filled 2017-12-01: qty 10

## 2017-12-01 MED ORDER — PHENYLEPHRINE HCL 10 MG/ML IJ SOLN
INTRAMUSCULAR | Status: DC | PRN
  Administered 2017-12-01: 100 ug via INTRAVENOUS

## 2017-12-01 NOTE — Anesthesia Preprocedure Evaluation (Signed)
Anesthesia Evaluation  Patient identified by MRN, date of birth, ID band Patient awake    Reviewed: Allergy & Precautions, H&P , NPO status , Patient's Chart, lab work & pertinent test results  History of Anesthesia Complications Negative for: history of anesthetic complications  Airway Mallampati: III  TM Distance: <3 FB Neck ROM: full    Dental  (+) Poor Dentition, Chipped, Missing   Pulmonary neg shortness of breath, former smoker,           Cardiovascular Exercise Tolerance: Good hypertension, (-) angina(-) Past MI and (-) DOE      Neuro/Psych PSYCHIATRIC DISORDERS Anxiety Depression negative neurological ROS  negative psych ROS   GI/Hepatic Neg liver ROS, GERD  Medicated and Controlled,  Endo/Other  Hypothyroidism   Renal/GU negative Renal ROS  negative genitourinary   Musculoskeletal   Abdominal   Peds  Hematology negative hematology ROS (+)   Anesthesia Other Findings Past Medical History: No date: Anxiety No date: Depression No date: GERD (gastroesophageal reflux disease) No date: Hyperlipidemia No date: Hypertension No date: Thyroid disease  Past Surgical History: No date: CESAREAN SECTION     Comment:  x 1 10/12/2014: COLONOSCOPY     Comment:  cleared for 3 years 03/16/2017: COLONOSCOPY; N/A     Comment:  Procedure: COLONOSCOPY;  Surgeon: Lucilla Lame, MD;                Location: [facility] ENDOSCOPY;  Service: Endoscopy;                Laterality: N/A; 03/15/2017: ESOPHAGOGASTRODUODENOSCOPY (EGD) WITH PROPOFOL; N/A     Comment:  Procedure: ESOPHAGOGASTRODUODENOSCOPY (EGD) WITH               PROPOFOL;  Surgeon: [name], MD;  Location: [facility]               ENDOSCOPY;  Service: Endoscopy;  Laterality: N/A; 03/30/2017: ESOPHAGOGASTRODUODENOSCOPY (EGD) WITH PROPOFOL; N/A     Comment:  Procedure: ESOPHAGOGASTRODUODENOSCOPY (EGD) WITH               PROPOFOL;  Surgeon: [name], MD;  Location: [facility]        ENDOSCOPY;  Service: Endoscopy;  Laterality: N/A;                Endoscopic Capsule Placement No date: GALLBLADDER SURGERY 03/17/2017: GIVENS CAPSULE STUDY; N/A     Comment:  Procedure: GIVENS CAPSULE STUDY;  Surgeon: Lucilla Lame,               MD;  Location: [facility] ENDOSCOPY;  Service: Endoscopy;                Laterality: N/A; No date: THROAT SURGERY     Comment:  nodule on vocal chord No date: TIBIA FRACTURE SURGERY; Right No date: WRIST SURGERY; Right     Comment:  arthritis  BMI    Body Mass Index:  32.28 kg/m      Reproductive/Obstetrics negative OB ROS                             Anesthesia Physical  Anesthesia Plan  ASA: III  Anesthesia Plan: General   Post-op Pain Management:    Induction: Intravenous  PONV Risk Score and Plan: Propofol infusion  Airway Management Planned: Natural Airway and Nasal Cannula  Additional Equipment:   Intra-op Plan:   Post-operative Plan:   Informed Consent: I have reviewed the patients History and  Physical, chart, labs and discussed the procedure including the risks, benefits and alternatives for the proposed anesthesia with the patient or authorized representative who has indicated his/her understanding and acceptance.   Dental Advisory Given  Plan Discussed with: Anesthesiologist, CRNA and Surgeon  Anesthesia Plan Comments: (Patient consented for risks of anesthesia including but not limited to:  - adverse reactions to medications - risk of intubation if required - damage to teeth, lips or other oral mucosa - sore throat or hoarseness - Damage to heart, brain, lungs or loss of life  Patient voiced understanding.)        Anesthesia Quick Evaluation

## 2017-12-01 NOTE — Op Note (Addendum)
[facility] Patient Name: Brenda Campbell Procedure Date: 12/01/2017 7:40 AM MRN: [id number] Account #: [id number] Date of Birth: 25-May-1951 Admit Type: Outpatient Age: 66 Room: [facility] ENDO ROOM 4 Gender: Female Note Status: Finalized Procedure:            Upper GI endoscopy Indications:          Melena Providers:            [name] MD, MD Referring MD:         Juline Patch, MD (Referring MD) Medicines:            Monitored Anesthesia Care Complications:        No immediate complications. Procedure:            Pre-Anesthesia Assessment:                       - Prior to the procedure, a History and Physical was                        performed, and patient medications, allergies and                        sensitivities were reviewed. The patient's tolerance of                        previous anesthesia was reviewed.                       - The risks and benefits of the procedure and the                        sedation options and risks were discussed with the                        patient. All questions were answered and informed                        consent was obtained.                       - ASA Grade Assessment: III - A patient with severe                        systemic disease.                       After obtaining informed consent, the endoscope was                        passed under direct vision. Throughout the procedure,                        the patient's blood pressure, pulse, and oxygen                        saturations were monitored continuously. The Endoscope                        was introduced through the mouth, and advanced to the  third part of duodenum. The upper GI endoscopy was                        accomplished with ease. The patient tolerated the                        procedure well. Findings:      The examined duodenum was normal. Using the endoscope, the video capsule   enteroscope was advanced into the duodenal bulb.      The esophagus was normal.      Evidence of a previous surgical anastomosis was found in the cardia.       This was characterized by healthy appearing mucosa.      The exam was otherwise without abnormality. Impression:           - Normal examined duodenum.                       - Normal esophagus.                       - A previous surgical anastomosis was found,                        characterized by healthy appearing mucosa.                       - The examination was otherwise normal.                       - Successful completion of the Video Capsule                        Enteroscope placement.                       - No specimens collected. Recommendation:       - Discharge patient to home (with escort).                       - As suggested after a capsule study.                       - Return to my office in 4 weeks. Procedure Code(s):    --- Professional ---                       [phone], Esophagogastroduodenoscopy, flexible, transoral;                        diagnostic, including collection of specimen(s) by                        brushing or washing, when performed (separate procedure) Diagnosis Code(s):    --- Professional ---                       Z98.0, Intestinal bypass and anastomosis status                       K92.1, Melena (includes Hematochezia) CPT copyright 2016 American Medical Association. All rights reserved. The codes documented in this report are preliminary and upon coder review may  be revised to meet current  compliance requirements. [name], MD [name] MD, MD 12/01/2017 8:54:24 AM This report has been signed electronically. Number of Addenda: 0 Note Initiated On: 12/01/2017 7:40 AM      [facility]

## 2017-12-01 NOTE — H&P (Signed)
[name], MD [address] [place], [place], [place], [address] [address], [place], [place], [place], [address] Phone: [phone]  Fax: [phone]  Primary Care Physician:  Juline Patch, MD   Pre-Procedure History & Physical: HPI:  Brenda Campbell is a 66 y.o. female is here for an endoscopy and colonoscopy    Past Medical History:  Diagnosis Date  . Anxiety   . Depression   . GERD (gastroesophageal reflux disease)   . Hyperlipidemia   . Hypertension   . Thyroid disease     Past Surgical History:  Procedure Laterality Date  . CESAREAN SECTION     x 1  . COLONOSCOPY  10/12/2014   cleared for 3 years  . COLONOSCOPY N/A 03/16/2017   Procedure: COLONOSCOPY;  Surgeon: Lucilla Lame, MD;  Location: [facility] ENDOSCOPY;  Service: Endoscopy;  Laterality: N/A;  . ESOPHAGOGASTRODUODENOSCOPY (EGD) WITH PROPOFOL N/A 03/15/2017   Procedure: ESOPHAGOGASTRODUODENOSCOPY (EGD) WITH PROPOFOL;  Surgeon: [name], MD;  Location: [facility] ENDOSCOPY;  Service: Endoscopy;  Laterality: N/A;  . ESOPHAGOGASTRODUODENOSCOPY (EGD) WITH PROPOFOL N/A 03/30/2017   Procedure: ESOPHAGOGASTRODUODENOSCOPY (EGD) WITH PROPOFOL;  Surgeon: [name], MD;  Location: [facility] ENDOSCOPY;  Service: Endoscopy;  Laterality: N/A;  Endoscopic Capsule Placement  . GALLBLADDER SURGERY    . GIVENS CAPSULE STUDY N/A 03/17/2017   Procedure: GIVENS CAPSULE STUDY;  Surgeon: Lucilla Lame, MD;  Location: [facility] ENDOSCOPY;  Service: Endoscopy;  Laterality: N/A;  . 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 TIBIA FRACTURE SURGERY Right   . WRIST SURGERY Right    arthritis    Prior to Admission medications   Medication Sig Start Date End Date Taking? Authorizing Provider  ALPRAZolam (XANAX) 0.25 MG tablet Take 1 tablet (0.25 mg total) by mouth as needed. 03/13/17   Juline Patch, MD  levothyroxine (SYNTHROID, LEVOTHROID) 50 MCG tablet Take 1 tablet (50 mcg total) by mouth daily. 03/13/17   Juline Patch, MD  omeprazole  (PRILOSEC) 20 MG capsule Take 1 capsule (20 mg total) by mouth 2 (two) times daily before a meal. Patient not taking: Reported on 03/23/2017 03/20/17 04/03/17  [name], MD  ranitidine ([facility] RANITIDINE) 75 MG tablet Take 1 tablet (75 mg total) by mouth as needed. otc 03/13/17   Juline Patch, MD  sertraline (ZOLOFT) 50 MG tablet Take 1 tablet (50 mg total) by mouth daily. 03/13/17   Juline Patch, MD    Allergies as of 11/07/2017  . (No Known Allergies)    Family History  Problem Relation Age of Onset  . Diabetes Mother   . Heart disease Mother     Social History   Socioeconomic History  . Marital status: Married    Spouse name: Not on file  . Number of children: Not on file  . Years of education: Not on file  . Highest education level: Not on file  Social Needs  . Financial resource strain: Not on file  . Food insecurity - worry: Not on file  . Food insecurity - inability: Not on file  . Transportation needs - medical: Not on file  . Transportation needs - non-medical: Not on file  Occupational History  . Not on file  Tobacco Use  . Smoking status: Former Research scientist (life sciences)  . Smokeless tobacco: Never Used  Substance and Sexual Activity  . Alcohol use: Yes    Alcohol/week: 5.4 oz    Types: 4 Cans of beer, 5 Shots of  liquor per week  . Drug use: No  . Sexual activity: Not Currently  Other Topics Concern  . Not on file  Social History Narrative  . Not on file    Review of Systems: See HPI, otherwise negative ROS  Physical Exam: BP 115/68   Pulse (!) 59   Temp (!) 97 F (36.1 C) (Tympanic)   Resp 20   Ht 5\' 7"  (1.702 m)   Wt 200 lb (90.7 kg)   SpO2 99%   BMI 31.32 kg/m  General:   Alert,  pleasant and cooperative in NAD Head:  Normocephalic and atraumatic. Neck:  Supple; no masses or thyromegaly. Lungs:  Clear throughout to auscultation, normal respiratory effort.    Heart:  +S1, +S2, Regular rate and rhythm, No edema. Abdomen:  Soft, nontender and nondistended.  Normal bowel sounds, without guarding, and without rebound.   Neurologic:  Alert and  oriented x4;  grossly normal neurologically.  Impression/Plan: Brenda Campbell is here for an endoscopy and colonoscopy  to be performed for endoscopic  placement of video capsule, surveillance due to prior colon polyps    Risks, benefits, limitations, and alternatives regarding endoscopy have been reviewed with the patient.  Questions have been answered.  All parties agreeable.   [name], MD  12/01/2017, 8:08 AM

## 2017-12-01 NOTE — Op Note (Signed)
[facility] Patient Name: Brenda Campbell Procedure Date: 12/01/2017 7:39 AM MRN: [id number] Account #: [id number] Date of Birth: [birth year] Admit Type: Outpatient Age: 66 Room: [facility] ENDO ROOM 4 Gender: Female Note Status: Finalized Procedure:            Colonoscopy Indications:          High risk colon cancer surveillance: Personal history                        of colonic polyps Providers:            [name] MD, MD Referring MD:         Juline Patch, MD (Referring MD) Medicines:            Monitored Anesthesia Care Complications:        No immediate complications. Procedure:            Pre-Anesthesia Assessment:                       - Prior to the procedure, a History and Physical was                        performed, and patient medications, allergies and                        sensitivities were reviewed. The patient's tolerance of                        previous anesthesia was reviewed.                       - The risks and benefits of the procedure and the                        sedation options and risks were discussed with the                        patient. All questions were answered and informed                        consent was obtained.                       - ASA Grade Assessment: III - A patient with severe                        systemic disease.                       After obtaining informed consent, the colonoscope was                        passed under direct vision. Throughout the procedure,                        the patient's blood pressure, pulse, and oxygen                        saturations were monitored continuously. The  Colonoscope was introduced through the anus and                        advanced to the the cecum, identified by the                        appendiceal orifice, IC valve and transillumination.                        The colonoscopy was performed with ease. The patient                         tolerated the procedure well. The quality of the bowel                        preparation was adequate. Findings:      The perianal and digital rectal examinations were normal.      Non-bleeding internal hemorrhoids were found during retroflexion. The       hemorrhoids were medium-sized and Grade I (internal hemorrhoids that do       not prolapse).      A few small-mouthed diverticula were found in the sigmoid colon.      Two sessile polyps were found in the transverse colon and ascending       colon. The polyps were 3 to 4 mm in size. These polyps were removed with       a cold biopsy forceps. Resection and retrieval were complete.      Two sessile polyps were found in the descending colon and transverse       colon. The polyps were 5 to 7 mm in size. These polyps were removed with       a cold snare. Resection and retrieval were complete.      The exam was otherwise without abnormality on direct and retroflexion       views. Impression:           - Non-bleeding internal hemorrhoids.                       - Diverticulosis in the sigmoid colon.                       - Two 3 to 4 mm polyps in the transverse colon and in                        the ascending colon, removed with a cold biopsy                        forceps. Resected and retrieved.                       - Two 5 to 7 mm polyps in the descending colon and in                        the transverse colon, removed with a cold snare.                        Resected and retrieved.                       - The  examination was otherwise normal on direct and                        retroflexion views. Recommendation:       - Discharge patient to home.                       - Await pathology results.                       - Perform an upper GI endoscopy today. Procedure Code(s):    --- Professional ---                       [phone], Colonoscopy, flexible; with removal of tumor(s),                        polyp(s), or  other lesion(s) by snare technique                       [id number], 47, Colonoscopy, flexible; with biopsy, single                        or multiple Diagnosis Code(s):    --- Professional ---                       Z86.010, Personal history of colonic polyps                       K64.0, First degree hemorrhoids                       D12.2, Benign neoplasm of ascending colon                       D12.4, Benign neoplasm of descending colon                       D12.3, Benign neoplasm of transverse colon (hepatic                        flexure or splenic flexure)                       K57.30, Diverticulosis of large intestine without                        perforation or abscess without bleeding CPT copyright 2016 American Medical Association. All rights reserved. The codes documented in this report are preliminary and upon coder review may  be revised to meet current compliance requirements. [name], MD [name] MD, MD 12/01/2017 8:38:39 AM This report has been signed electronically. Number of Addenda: 0 Note Initiated On: 12/01/2017 7:39 AM Scope Withdrawal Time: 0 hours 15 minutes 17 seconds  Total Procedure Duration: 0 hours 20 minutes 47 seconds       [facility]

## 2017-12-01 NOTE — Anesthesia Postprocedure Evaluation (Signed)
Anesthesia Post Note  Patient: Brenda Campbell  Procedure(s) Performed: COLONOSCOPY WITH PROPOFOL (N/A ) GIVENS CAPSULE STUDY, 12 HOUR (N/A ) ESOPHAGOGASTRODUODENOSCOPY (EGD) WITH PROPOFOL (N/A )  Patient location during evaluation: PACU Anesthesia Type: General Level of consciousness: awake and alert and oriented Pain management: pain level controlled Vital Signs Assessment: post-procedure vital signs reviewed and stable Respiratory status: spontaneous breathing Cardiovascular status: blood pressure returned to baseline Anesthetic complications: no     Last Vitals:  Vitals:   12/01/17 0921 12/01/17 0931  BP: 104/79 124/67  Pulse: (!) 51 (!) 52  Resp: 17 18  Temp:    SpO2: 100% 100%    Last Pain:  Vitals:   12/01/17 0901  TempSrc: Tympanic                 Ilan Kahrs

## 2017-12-01 NOTE — Anesthesia Procedure Notes (Signed)
Date/Time: 12/01/2017 8:20 AM Performed by: Nelda Marseille, CRNA Pre-anesthesia Checklist: Patient identified, Emergency Drugs available, Suction available, Patient being monitored and Timeout performed Oxygen Delivery Method: Nasal cannula

## 2017-12-01 NOTE — Transfer of Care (Signed)
Immediate Anesthesia Transfer of Care Note  Patient: [place] Ward Encarnacion  Procedure(s) Performed: COLONOSCOPY WITH PROPOFOL (N/A ) GIVENS CAPSULE STUDY, 12 HOUR (N/A ) ESOPHAGOGASTRODUODENOSCOPY (EGD) WITH PROPOFOL (N/A )  Patient Location: PACU  Anesthesia Type:General  Level of Consciousness: awake, alert  and oriented  Airway & Oxygen Therapy: Patient Spontanous Breathing and Patient connected to nasal cannula oxygen  Post-op Assessment: Report given to RN and Post -op Vital signs reviewed and stable  Post vital signs: Reviewed and stable  Last Vitals:  Vitals:   12/01/17 0737  BP: 115/68  Pulse: (!) 59  Resp: 20  Temp: (!) 36.1 C  SpO2: 99%    Last Pain:  Vitals:   12/01/17 0737  TempSrc: Tympanic         Complications: No apparent anesthesia complications

## 2017-12-01 NOTE — Anesthesia Post-op Follow-up Note (Signed)
Anesthesia QCDR form completed.        

## 2017-12-04 LAB — SURGICAL PATHOLOGY

## 2017-12-05 ENCOUNTER — Encounter: Payer: Self-pay | Admitting: Gastroenterology

## 2017-12-06 ENCOUNTER — Encounter: Payer: Self-pay | Admitting: Gastroenterology

## 2017-12-09 ENCOUNTER — Ambulatory Visit
Admission: EM | Admit: 2017-12-09 | Discharge: 2017-12-09 | Disposition: A | Discharge status: Home / Self Care | Payer: PPO | Attending: Family Medicine | Admitting: Family Medicine

## 2017-12-09 ENCOUNTER — Encounter: Payer: Self-pay | Admitting: Gynecology

## 2017-12-09 ENCOUNTER — Other Ambulatory Visit: Payer: Self-pay

## 2017-12-09 DIAGNOSIS — N390 Urinary tract infection, site not specified: Secondary | ICD-10-CM

## 2017-12-09 DIAGNOSIS — R319 Hematuria, unspecified: Secondary | ICD-10-CM | POA: Diagnosis not present

## 2017-12-09 LAB — URINALYSIS, COMPLETE (UACMP) WITH MICROSCOPIC
BILIRUBIN URINE: NEGATIVE
GLUCOSE, UA: NEGATIVE mg/dL
KETONES UR: NEGATIVE mg/dL
NITRITE: NEGATIVE
PH: 7 (ref 5.0–8.0)
PROTEIN: NEGATIVE mg/dL
SQUAMOUS EPITHELIAL / LPF: NONE SEEN
Specific Gravity, Urine: 1.015 (ref 1.005–1.030)

## 2017-12-09 MED ORDER — SULFAMETHOXAZOLE-TRIMETHOPRIM 800-160 MG PO TABS: 1.0000 | ORAL_TABLET | Freq: Two times a day (BID) | ORAL | 0 refills | Status: AC

## 2017-12-09 NOTE — ED Provider Notes (Signed)
[facility] ____________________________________________  Time seen: Approximately 2:00 PM  I have reviewed the triage vital signs and the nursing notes.   HISTORY  Chief Complaint Urinary Tract Infection   HPI Brenda Campbell is a 66 y.o. female presented for evaluation of urinary frequency, urinary urgency and discomfort with urination.  States symptoms present for the last 2 days.  Denies known trigger, but states that she may not have been drinking as much water as she needs to due to staying busy.  States currently no pain.  States discomfort only with active urination.  Denies associated abdominal pain, back pain, vomiting, diarrhea, fever.  Reports otherwise feels well.  Denies other aggravating or alleviating factors.  Denies any resistant to antibiotics in the past.  Denies chronic renal issues. Denies chest pain, shortness of breath, abdominal pain, or rash. Denies recent sickness. Denies recent antibiotic use.   Juline Patch, MD: PCP   Past Medical History:  Diagnosis Date  . Anxiety   . Depression   . GERD (gastroesophageal reflux disease)   . Hyperlipidemia   . Hypertension   . Thyroid disease     Patient Active Problem List   Diagnosis Date Noted  . Gastrointestinal hemorrhage with melena 06/28/2017  . Blood in stool   . Acute GI bleeding   . Benign neoplasm of transverse colon   . GIB (gastrointestinal bleeding) 03/15/2017  . Panic disorder 03/13/2017  . 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 Mixed hyperlipidemia 12/23/2014  . Paroxysmal supraventricular tachycardia ([place]) 12/23/2014  . 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 Gastroesophageal reflux disease 12/22/2014  . Hypothyroidism 12/22/2014  . Obesity 12/22/2014  . Tachycardia 12/22/2014  . Malaise 12/22/2014    Past Surgical History:  Procedure Laterality Date  . CESAREAN SECTION     x 1  . COLONOSCOPY  10/12/2014     cleared for 3 years  . COLONOSCOPY N/A 03/16/2017   Procedure: COLONOSCOPY;  Surgeon: Lucilla Lame, MD;  Location: [facility] ENDOSCOPY;  Service: Endoscopy;  Laterality: N/A;  . COLONOSCOPY WITH PROPOFOL N/A 12/01/2017   Procedure: COLONOSCOPY WITH PROPOFOL;  Surgeon: [name], MD;  Location: [facility] ENDOSCOPY;  Service: Gastroenterology;  Laterality: N/A;  . ESOPHAGOGASTRODUODENOSCOPY (EGD) WITH PROPOFOL N/A 03/15/2017   Procedure: ESOPHAGOGASTRODUODENOSCOPY (EGD) WITH PROPOFOL;  Surgeon: [name], MD;  Location: [facility] ENDOSCOPY;  Service: Endoscopy;  Laterality: N/A;  . ESOPHAGOGASTRODUODENOSCOPY (EGD) WITH PROPOFOL N/A 03/30/2017   Procedure: ESOPHAGOGASTRODUODENOSCOPY (EGD) WITH PROPOFOL;  Surgeon: [name], MD;  Location: [facility] ENDOSCOPY;  Service: Endoscopy;  Laterality: N/A;  Endoscopic Capsule Placement  . ESOPHAGOGASTRODUODENOSCOPY (EGD) WITH PROPOFOL N/A 12/01/2017   Procedure: ESOPHAGOGASTRODUODENOSCOPY (EGD) WITH PROPOFOL;  Surgeon: [name], MD;  Location: [facility] ENDOSCOPY;  Service: Gastroenterology;  Laterality: N/A;  . GALLBLADDER SURGERY    . GIVENS CAPSULE STUDY N/A 03/17/2017   Procedure: GIVENS CAPSULE STUDY;  Surgeon: Lucilla Lame, MD;  Location: [facility] ENDOSCOPY;  Service: Endoscopy;  Laterality: N/A;  . GIVENS CAPSULE STUDY N/A 12/01/2017   Procedure: GIVENS CAPSULE STUDY, 12 HOUR;  Surgeon: [name], MD;  Location: [facility] ENDOSCOPY;  Service: Gastroenterology;  Laterality: N/A;  . 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 TIBIA FRACTURE SURGERY Right   . WRIST SURGERY Right    arthritis     No current facility-administered medications for this encounter.   Current Outpatient Medications:  .  ALPRAZolam (XANAX) 0.25 MG tablet, Take 1 tablet (0.25 mg total) by mouth  as needed., Disp: 30 tablet, Rfl: 0 .  levothyroxine (SYNTHROID, LEVOTHROID) 50 MCG tablet, Take 1 tablet (50 mcg total) by mouth daily., Disp: 30 tablet, Rfl: 11 .  ranitidine ([facility] RANITIDINE) 75 MG tablet,  Take 1 tablet (75 mg total) by mouth as needed. otc, Disp: 90 tablet, Rfl: 3 .  sertraline (ZOLOFT) 50 MG tablet, Take 1 tablet (50 mg total) by mouth daily., Disp: 30 tablet, Rfl: 11 .  omeprazole (PRILOSEC) 20 MG capsule, Take 1 capsule (20 mg total) by mouth 2 (two) times daily before a meal. (Patient not taking: Reported on 03/23/2017), Disp: 90 capsule, Rfl: 3 .  sulfamethoxazole-trimethoprim (BACTRIM DS,SEPTRA DS) 800-160 MG tablet, Take 1 tablet by mouth 2 (two) times daily for 5 days., Disp: 10 tablet, Rfl: 0  Allergies Patient has no known allergies.  Family History  Problem Relation Age of Onset  . Diabetes Mother   . Heart disease Mother     Social History Social History   Tobacco Use  . Smoking status: Former Research scientist (life sciences)  . Smokeless tobacco: Never Used  Substance Use Topics  . Alcohol use: Yes    Alcohol/week: 5.4 oz    Types: 4 Cans of beer, 5 Shots of liquor per week  . Drug use: No    Review of Systems Constitutional: No fever/chills Cardiovascular: Denies chest pain. Respiratory: Denies shortness of breath. Gastrointestinal: No abdominal pain.  No nausea, no vomiting.  No diarrhea. Genitourinary: Positive for dysuria. Musculoskeletal: Negative for back pain. Skin: Negative for rash.   ____________________________________________   PHYSICAL EXAM:  VITAL SIGNS: ED Triage Vitals  Enc Vitals Group     BP 12/09/17 1325 131/71     Pulse Rate 12/09/17 1325 65     Resp 12/09/17 1325 16     Temp 12/09/17 1325 98.4 F (36.9 C)     Temp Source 12/09/17 1325 Oral     SpO2 12/09/17 1325 99 %     Weight 12/09/17 1327 200 lb (90.7 kg)     Height --      Head Circumference --      Peak Flow --      Pain Score 12/09/17 1327 7     Pain Loc --      Pain Edu? --      Excl. in [place]? --     Constitutional: Alert and oriented. Well appearing and in no acute distress. Cardiovascular: Normal rate, regular rhythm. Grossly normal heart sounds.  Good peripheral  circulation. Respiratory: Normal respiratory effort without tachypnea nor retractions. Breath sounds are clear and equal bilaterally. No wheezes, rales, rhonchi. Gastrointestinal: Soft and nontender. No CVA tenderness. Musculoskeletal: No midline cervical, thoracic or lumbar tenderness to palpation.  Neurologic:  Normal speech and language. Speech is normal. No gait instability.  Skin:  Skin is warm, dry and intact. No rash noted. Psychiatric: Mood and affect are normal. Speech and behavior are normal. Patient exhibits appropriate insight and judgment   ___________________________________________   LABS (all labs ordered are listed, but only abnormal results are displayed)  Labs Reviewed  URINALYSIS, COMPLETE (UACMP) WITH MICROSCOPIC - Abnormal; Notable for the following components:      Result Value   APPearance CLOUDY (*)    Hgb urine dipstick MODERATE (*)    Leukocytes, UA MODERATE (*)    Bacteria, UA FEW (*)    All other components within normal limits  URINE CULTURE   ____________________________________________   PROCEDURES Procedures    INITIAL IMPRESSION / ASSESSMENT AND  PLAN / ED COURSE  Pertinent labs & imaging results that were available during my care of the patient were reviewed by me and considered in my medical decision making (see chart for details).  Well-appearing patient.  No acute distress.  Presented for evaluation of dysuria, urinalysis reviewed.  Suspect UTI.  Will culture urine and empirically treat patient with oral Bactrim.  Encourage rest, fluids, supportive care.Discussed indication, risks and benefits of medications with patient.  Discussed follow up with Primary care physician this week. Discussed follow up and return parameters including no resolution or any worsening concerns. Patient verbalized understanding and agreed to plan.   ____________________________________________   FINAL CLINICAL IMPRESSION(S) / ED DIAGNOSES  Final diagnoses:    Urinary tract infection with hematuria, site unspecified     ED Discharge Orders        Ordered    sulfamethoxazole-trimethoprim (BACTRIM DS,SEPTRA DS) 800-160 MG tablet  2 times daily     12/09/17 1414       Note: This dictation was prepared with Dragon dictation along with smaller phrase technology. Any transcriptional errors that result from this process are unintentional.         Marylene Land, NP 12/09/17 1435

## 2017-12-09 NOTE — ED Triage Notes (Signed)
Patient c/o burning and frequency urination.

## 2017-12-09 NOTE — Discharge Instructions (Signed)
Take medication as prescribed. Rest. Drink plenty of fluids.  ° °Follow up with your primary care physician this week as needed. Return to Urgent care for new or worsening concerns.  ° °

## 2017-12-12 ENCOUNTER — Other Ambulatory Visit: Payer: Self-pay | Admitting: Family Medicine

## 2017-12-12 ENCOUNTER — Telehealth: Payer: Self-pay

## 2017-12-12 DIAGNOSIS — F410 Panic disorder [episodic paroxysmal anxiety] without agoraphobia: Secondary | ICD-10-CM

## 2017-12-12 LAB — URINE CULTURE

## 2017-12-12 NOTE — Telephone Encounter (Signed)
I explained to pt she had E Coli in her urine and she should finish course of ABX. She is feeling much better and will f/u as needed

## 2017-12-15 ENCOUNTER — Telehealth: Payer: Self-pay | Admitting: Gastroenterology

## 2017-12-15 NOTE — Telephone Encounter (Signed)
Left voice message for patient to call and schedule a 4 week follow up with Dr. Vicente Males.

## 2017-12-18 ENCOUNTER — Telehealth: Payer: Self-pay | Admitting: Gastroenterology

## 2017-12-18 ENCOUNTER — Encounter: Payer: Self-pay | Admitting: Gastroenterology

## 2017-12-18 NOTE — Telephone Encounter (Signed)
Left a voice message for patient to call and schedule a 4 week follow up with Dr. Vicente Males. Letter sent

## 2017-12-26 IMAGING — CR DG ABDOMEN 1V
2 series · 2 of 2 positions shown · non-contrast
Comparison: None.

CLINICAL DATA: Evaluation of Givens capsule.  Blood in stool.

EXAM:
ABDOMEN - 1 VIEW

[abdomen kub (1 of 2)]
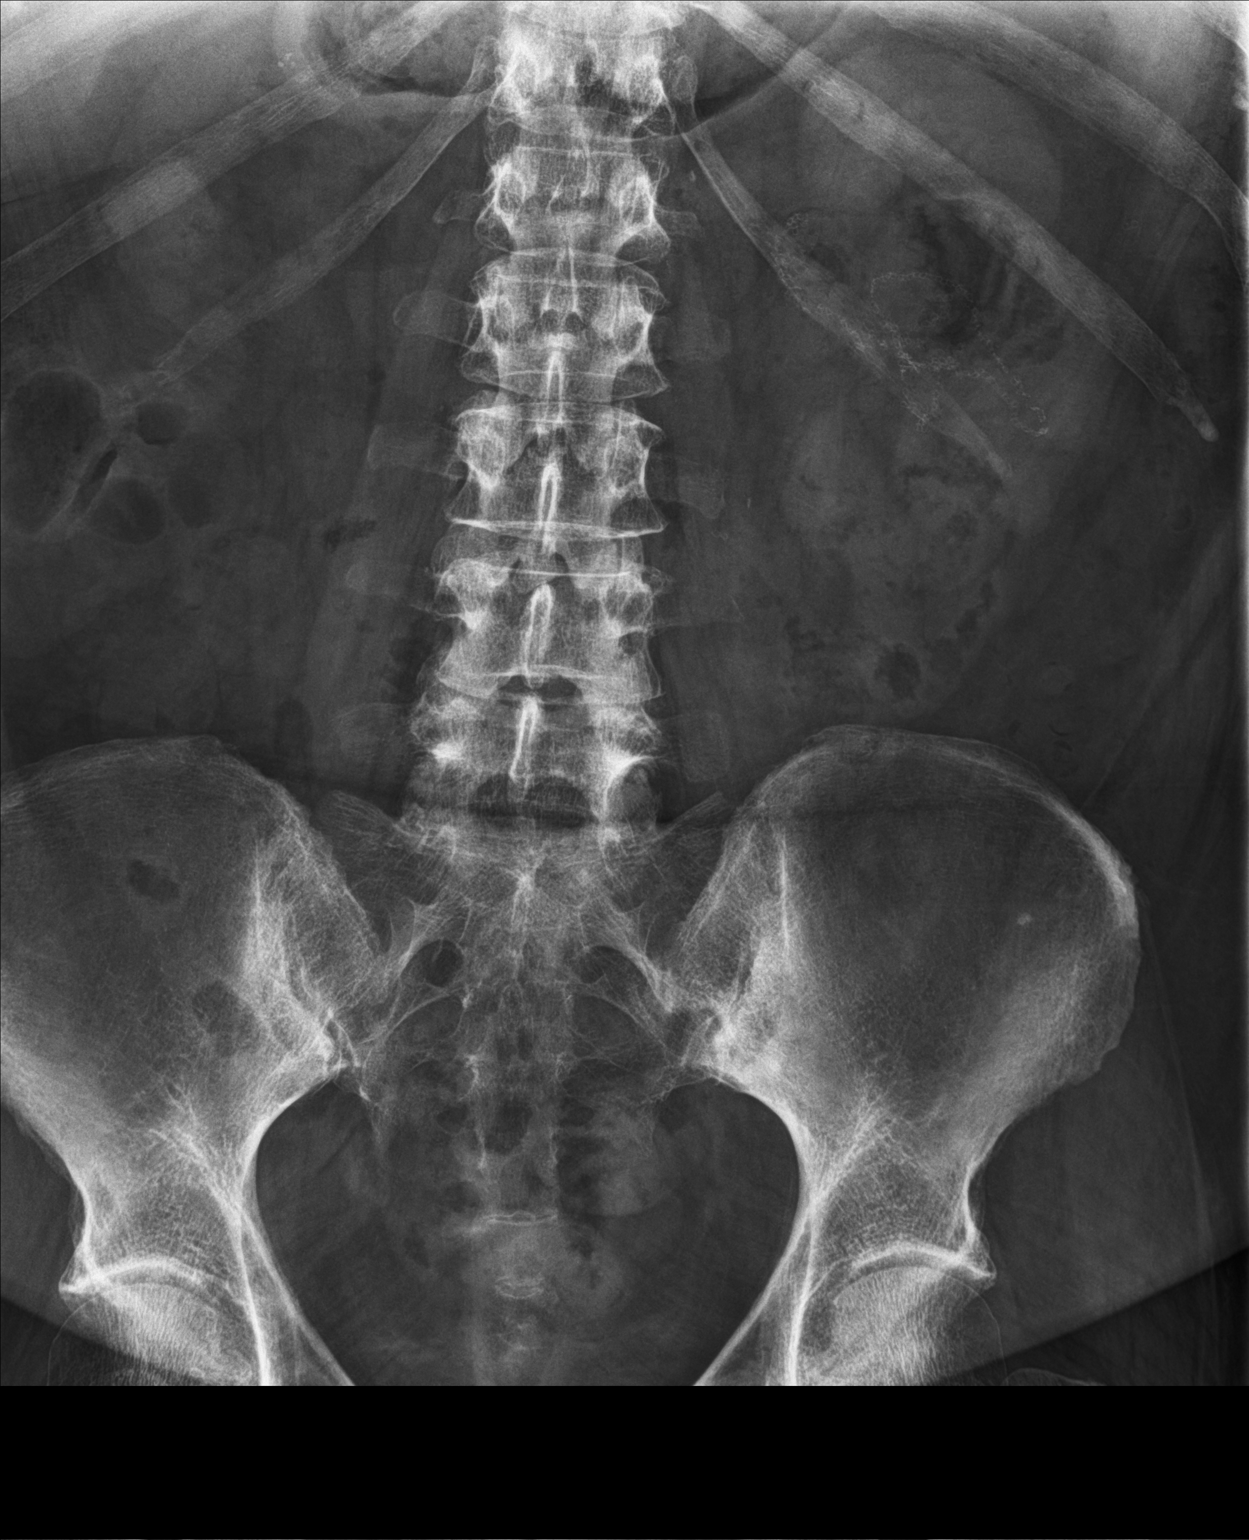

[abdomen kub (2 of 2)]
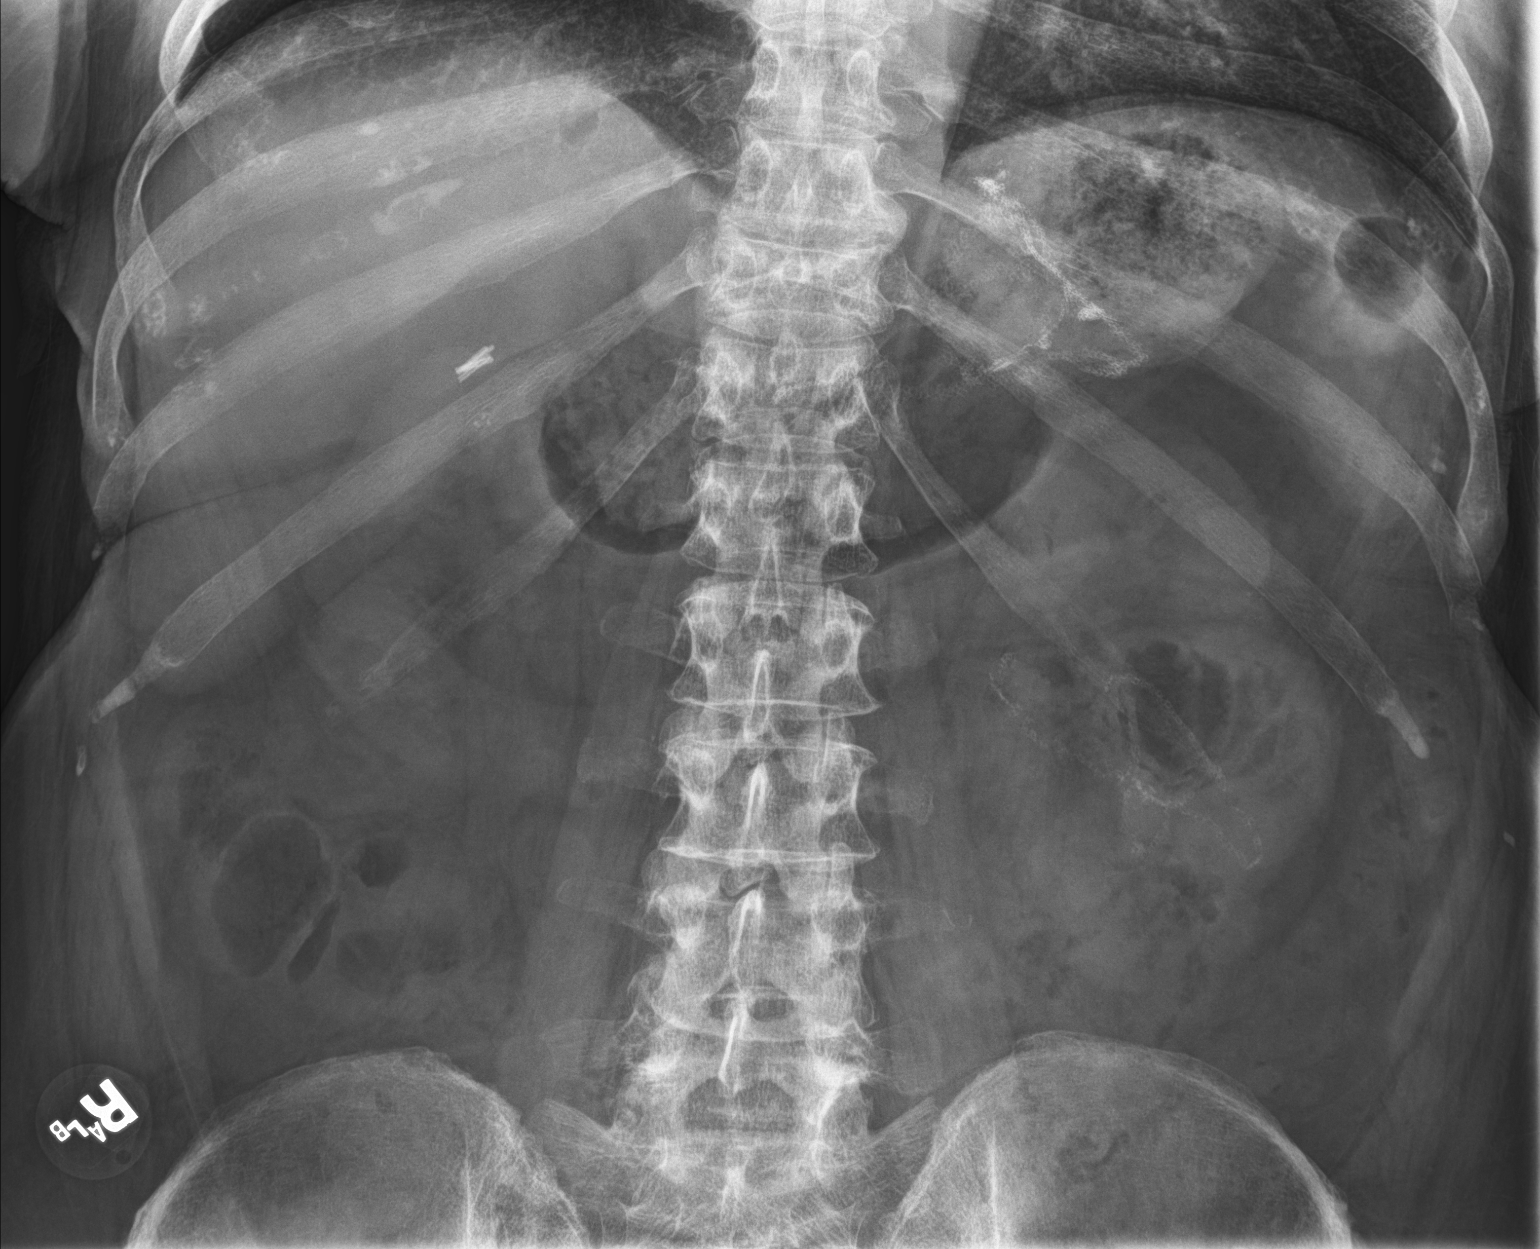

[2 of 2 positions shown; findings below may reference images not displayed]

FINDINGS: The bowel gas pattern is normal. No radio-opaque calculi or other
significant radiographic abnormality are seen.
IMPRESSION: Negative. The previous visualize capsule has passed, and is no
longer seen.

## 2018-01-22 ENCOUNTER — Encounter: Payer: Self-pay | Admitting: Gastroenterology

## 2018-01-22 ENCOUNTER — Ambulatory Visit (INDEPENDENT_AMBULATORY_CARE_PROVIDER_SITE_OTHER): Payer: PPO | Admitting: Gastroenterology

## 2018-01-22 ENCOUNTER — Other Ambulatory Visit: Payer: Self-pay

## 2018-01-22 VITALS — BP 116/65 | HR 55 | Temp 97.8°F | Ht 66.0 in | Wt 216.4 lb

## 2018-01-22 DIAGNOSIS — K31A0 Gastric intestinal metaplasia, unspecified: Secondary | ICD-10-CM

## 2018-01-22 DIAGNOSIS — A048 Other specified bacterial intestinal infections: Secondary | ICD-10-CM | POA: Diagnosis not present

## 2018-01-22 DIAGNOSIS — K3189 Other diseases of stomach and duodenum: Secondary | ICD-10-CM | POA: Diagnosis not present

## 2018-01-22 NOTE — Progress Notes (Signed)
Brenda Bellows MD, MRCP(U.K) [address]  [place]  [place], [place] [address]  Main: [phone]  Fax: [phone]   Primary Care Physician: Juline Patch, MD  Primary Gastroenterologist:  Dr. Jonathon Campbell   Chief Complaint  Patient presents with  . Follow-up    HPI: Brenda Campbell is a 67 y.o. female    Summary of history : She was admitted on 03/15/2017 with melena. It began all of a sudden. I was consulted to see her . I performed an EGD which was negative for an active bleed but showed features of gastritis which on pathology was positive for H pylori as well as focal intestinal metaplasia. We prescribed her H pylori eradication therapy. . A colonoscopy showed blood in the terminal ileum and my plan was to perform a capsule study.   I performed a capsule study prior to discharge but unfortunately it did not exit the stomach in time before she had food ingested and hence visualization was incomplete.   Hb at discharge was 8.9 grams. On 03/20/17 was 8.3 grams. 03/30/17 - small bowel capsule placed endoscopically which reached probably the mid or distal ileum and the recorder ran out of space to record. 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 Denies any brown stool or any complaints   Interval history 04/19/17 -01/22/18  12/01/17- Capsule placed endoscopically- no gross lesions seen .  12/01/17-colonoscopy- few tubular adenomas taken out   She recollects at the time of hospital admission- she was taking BC powder daily.    Current Outpatient Medications  Medication Sig Dispense Refill  . ALPRAZolam (XANAX) 0.25 MG tablet Take 1 tablet (0.25 mg total) by mouth as needed. 30 tablet 0  . aspirin EC 81 MG tablet Take 81 mg by mouth daily.    Marland Kitchen levothyroxine (SYNTHROID, LEVOTHROID) 50 MCG tablet Take 1 tablet (50 mcg total) by mouth daily. 30 tablet 11    . sertraline (ZOLOFT) 50 MG tablet Take 1 tablet (50 mg total) by mouth daily. 30 tablet 11  . Aspirin-Acetaminophen-Caffeine 130-865-78 MG PACK Take by mouth.    Marland Kitchen omeprazole (PRILOSEC) 20 MG capsule Take 1 capsule (20 mg total) by mouth 2 (two) times daily before a meal. (Patient not taking: Reported on 03/23/2017) 90 capsule 3  . ranitidine ([facility] RANITIDINE) 75 MG tablet Take 1 tablet (75 mg total) by mouth as needed. otc (Patient not taking: Reported on 01/22/2018) 90 tablet 3   No current facility-administered medications for this visit.     Allergies as of 01/22/2018  . (No Known Allergies)    ROS:  General: Negative for anorexia, weight loss, fever, chills, fatigue, weakness. ENT: Negative for hoarseness, difficulty swallowing , nasal congestion. CV: Negative for chest pain, angina, palpitations, dyspnea on exertion, peripheral edema.  Respiratory: Negative for dyspnea at rest, dyspnea on exertion, cough, sputum, wheezing.  GI: See history of present illness. GU:  Negative for dysuria, hematuria, urinary incontinence, urinary frequency, nocturnal urination.  Endo: Negative for unusual weight change.    Physical Examination:   BP 116/65 (BP Location: Left Arm, Patient Position: Sitting, Cuff Size: Normal)   Pulse (!) 55   Temp 97.8 F (36.6 C) (Oral)   Ht 5\' 6"  (1.676 m)   Wt 216 lb 6.4 oz (98.2 kg)   BMI 34.93 kg/m   General: Well-nourished, well-developed in no acute distress.  Eyes: No icterus. Conjunctivae pink. Mouth: Oropharyngeal mucosa moist and pink , no lesions erythema or exudate. Lungs: Clear to auscultation bilaterally. Non-labored. Heart: Regular rate and rhythm, no murmurs rubs or gallops.  Abdomen: Bowel sounds are normal, nontender, nondistended, no hepatosplenomegaly or masses, no abdominal bruits or hernia , no rebound or guarding.   Extremities: No lower extremity edema. No clubbing or deformities. Neuro: Alert and oriented x 3.  Grossly intact. Skin:  Warm and dry, no jaundice.   Psych: Alert and cooperative, normal mood and affect.   Imaging Studies: No results found.  Assessment and Plan:   Brenda Campbell is a 67 y.o. y/o female s here today for follow up. H/o  admission  with a GI bleed in early 2018 which appears to have been from the small bowel . EGD showed gastritis which was positive for H pylori, colonoscopy showed blood in the terminal ileum . Capsule study 11/2017 negative. H/o gastric intestinal metaplasia.     Plan .   1. Check H pylori in stool  2. EGD in 6 months to evaluate for gastric mapping for intestinal metaplasia.    I have discussed alternative options, risks & benefits,  which include, but are not limited to, bleeding, infection, perforation,respiratory complication & drug reaction.  The patient agrees with this plan & written consent will be obtained.      Dr Brenda Bellows  MD,MRCP [facility]) Follow up in 8 months

## 2018-01-23 ENCOUNTER — Encounter: Payer: Self-pay | Admitting: Gastroenterology

## 2018-02-06 ENCOUNTER — Other Ambulatory Visit: Payer: Self-pay

## 2018-02-07 DIAGNOSIS — K921 Melena: Secondary | ICD-10-CM | POA: Diagnosis not present

## 2018-02-09 LAB — H. PYLORI ANTIGEN, STOOL: H pylori Ag, Stl: NEGATIVE

## 2018-02-11 ENCOUNTER — Encounter: Payer: Self-pay | Admitting: Gastroenterology

## 2018-03-14 ENCOUNTER — Encounter: Payer: Self-pay | Admitting: Family Medicine

## 2018-03-14 ENCOUNTER — Ambulatory Visit (INDEPENDENT_AMBULATORY_CARE_PROVIDER_SITE_OTHER): Payer: PPO | Admitting: Family Medicine

## 2018-03-14 VITALS — BP 108/70 | HR 76 | Ht 66.0 in | Wt 214.0 lb

## 2018-03-14 DIAGNOSIS — K2971 Gastritis, unspecified, with bleeding: Secondary | ICD-10-CM | POA: Diagnosis not present

## 2018-03-14 DIAGNOSIS — F330 Major depressive disorder, recurrent, mild: Secondary | ICD-10-CM | POA: Diagnosis not present

## 2018-03-14 DIAGNOSIS — A048 Other specified bacterial intestinal infections: Secondary | ICD-10-CM

## 2018-03-14 DIAGNOSIS — E782 Mixed hyperlipidemia: Secondary | ICD-10-CM | POA: Diagnosis not present

## 2018-03-14 DIAGNOSIS — F410 Panic disorder [episodic paroxysmal anxiety] without agoraphobia: Secondary | ICD-10-CM

## 2018-03-14 DIAGNOSIS — E039 Hypothyroidism, unspecified: Secondary | ICD-10-CM | POA: Diagnosis not present

## 2018-03-14 DIAGNOSIS — D500 Iron deficiency anemia secondary to blood loss (chronic): Secondary | ICD-10-CM

## 2018-03-14 DIAGNOSIS — I471 Supraventricular tachycardia, unspecified: Secondary | ICD-10-CM

## 2018-03-14 MED ORDER — SERTRALINE HCL 50 MG PO TABS: 50.0000 mg | ORAL_TABLET | Freq: Every day | ORAL | 11 refills | Status: DC

## 2018-03-14 MED ORDER — LEVOTHYROXINE SODIUM 50 MCG PO TABS: 50.0000 ug | ORAL_TABLET | Freq: Every day | ORAL | 11 refills | Status: DC

## 2018-03-14 MED ORDER — ALPRAZOLAM 0.25 MG PO TABS: 0.2500 mg | ORAL_TABLET | ORAL | 0 refills | Status: DC | PRN

## 2018-03-14 MED ORDER — OMEPRAZOLE 20 MG PO CPDR: 20.0000 mg | DELAYED_RELEASE_CAPSULE | Freq: Every day | ORAL | 5 refills | Status: DC

## 2018-03-14 MED ORDER — SERTRALINE HCL 50 MG PO TABS: 50.0000 mg | ORAL_TABLET | Freq: Every day | ORAL | 5 refills | Status: DC

## 2018-03-14 MED ORDER — LEVOTHYROXINE SODIUM 50 MCG PO TABS: 50.0000 ug | ORAL_TABLET | Freq: Every day | ORAL | 5 refills | Status: DC

## 2018-03-14 MED ORDER — OMEPRAZOLE 20 MG PO CPDR: 20.0000 mg | DELAYED_RELEASE_CAPSULE | Freq: Every day | ORAL | 11 refills | Status: DC

## 2018-03-14 NOTE — Progress Notes (Signed)
Name: Brenda Campbell   MRN: [id number]    DOB: Mar 07, 1951   Date:03/14/2018       Progress Note  Subjective  Chief Complaint  Chief Complaint  Patient presents with  . Depression  . Gastroesophageal Reflux  . Hypothyroidism    needs labs, including hgb and ferritin levels due to GI bleed  . Anxiety    Depression         This is a chronic problem.  The current episode started more than 1 year ago.   The onset quality is gradual.   The problem occurs intermittently.  The problem has been gradually improving since onset.  Associated symptoms include no decreased concentration, no fatigue, no helplessness, no hopelessness, does not have insomnia, not irritable, no restlessness, no decreased interest, no appetite change, no body aches, no myalgias, no headaches, no indigestion, not sad and no suicidal ideas.     The symptoms are aggravated by nothing.  Past treatments include SSRIs - Selective serotonin reuptake inhibitors.  Compliance with treatment is good.  Previous treatment provided mild relief.  Past medical history includes hypothyroidism, thyroid problem and anxiety.   Gastroesophageal Reflux  She reports no abdominal pain, no belching, no chest pain, no choking, no coughing, no dysphagia, no early satiety, no heartburn, no hoarse voice, no nausea, no sore throat, no stridor, no water brash or no wheezing. This is a chronic problem. The current episode started more than 1 year ago. The problem has been gradually improving. The symptoms are aggravated by certain foods. Pertinent negatives include no anemia, fatigue, melena, muscle weakness, orthopnea or weight loss. She has tried a PPI for the symptoms. The treatment provided moderate relief.  Anxiety  Presents for follow-up visit. Symptoms include nervous/anxious behavior. Patient reports no chest pain, compulsions, confusion, decreased concentration, depressed mood, dizziness, excessive worry, insomnia, irritability, nausea,  palpitations, panic, restlessness, shortness of breath or suicidal ideas. The severity of symptoms is mild.   Side effects of treatment include GI discomfort.  Hyperlipidemia  This is a chronic problem. The current episode started more than 1 year ago. The problem is uncontrolled. Exacerbating diseases include hypothyroidism. She has no history of chronic renal disease, diabetes or liver disease. Factors aggravating her hyperlipidemia include fatty foods. Pertinent negatives include no chest pain, focal weakness, myalgias or shortness of breath. The current treatment provides mild improvement of lipids. There are no compliance problems.   Thyroid Problem  Presents for follow-up visit. Symptoms include anxiety. Patient reports no cold intolerance, constipation, depressed mood, diaphoresis, diarrhea, dry skin, fatigue, hair loss, heat intolerance, hoarse voice, leg swelling, menstrual problem, nail problem, palpitations, tremors, visual change, weight gain or weight loss. The symptoms have been improving. Her past medical history is significant for hyperlipidemia. There is no history of diabetes.    No problem-specific Assessment & Plan notes found for this encounter.   Past Medical History:  Diagnosis Date  . Anxiety   . Depression   . GERD (gastroesophageal reflux disease)   . Hyperlipidemia   . Hypertension   . Thyroid disease     Past Surgical History:  Procedure Laterality Date  . CESAREAN SECTION     x 1  . COLONOSCOPY  10/12/2014   cleared for 3 years  . COLONOSCOPY N/A 03/16/2017   Procedure: COLONOSCOPY;  Surgeon: Lucilla Lame, MD;  Location: [facility] ENDOSCOPY;  Service: Endoscopy;  Laterality: N/A;  . COLONOSCOPY WITH PROPOFOL N/A 12/01/2017   Procedure: COLONOSCOPY WITH PROPOFOL;  Surgeon: [name],  MD;  Location: [facility] ENDOSCOPY;  Service: Gastroenterology;  Laterality: N/A;  . ESOPHAGOGASTRODUODENOSCOPY (EGD) WITH PROPOFOL N/A 03/15/2017   Procedure: ESOPHAGOGASTRODUODENOSCOPY  (EGD) WITH PROPOFOL;  Surgeon: [name], MD;  Location: [facility] ENDOSCOPY;  Service: Endoscopy;  Laterality: N/A;  . ESOPHAGOGASTRODUODENOSCOPY (EGD) WITH PROPOFOL N/A 03/30/2017   Procedure: ESOPHAGOGASTRODUODENOSCOPY (EGD) WITH PROPOFOL;  Surgeon: [name], MD;  Location: [facility] ENDOSCOPY;  Service: Endoscopy;  Laterality: N/A;  Endoscopic Capsule Placement  . ESOPHAGOGASTRODUODENOSCOPY (EGD) WITH PROPOFOL N/A 12/01/2017   Procedure: ESOPHAGOGASTRODUODENOSCOPY (EGD) WITH PROPOFOL;  Surgeon: [name], MD;  Location: [facility] ENDOSCOPY;  Service: Gastroenterology;  Laterality: N/A;  . GALLBLADDER SURGERY    . GIVENS CAPSULE STUDY N/A 03/17/2017   Procedure: GIVENS CAPSULE STUDY;  Surgeon: Lucilla Lame, MD;  Location: [facility] ENDOSCOPY;  Service: Endoscopy;  Laterality: N/A;  . GIVENS CAPSULE STUDY N/A 12/01/2017   Procedure: GIVENS CAPSULE STUDY, 12 HOUR;  Surgeon: [name], MD;  Location[facility] ENDOSCOPY;  Service: Gastroenterology;  Laterality: N/A;  . 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 TIBIA FRACTURE SURGERY Right   . WRIST SURGERY Right    arthritis    Family History  Problem Relation Age of Onset  . Diabetes Mother   . Heart disease Mother     Social History   Socioeconomic History  . Marital status: Married    Spouse name: Not on file  . Number of children: Not on file  . Years of education: Not on file  . Highest education level: Not on file  Occupational History  . Not on file  Social Needs  . Financial resource strain: Not on file  . Food insecurity:    Worry: Not on file    Inability: Not on file  . Transportation needs:    Medical: Not on file    Non-medical: Not on file  Tobacco Use  . Smoking status: Former Research scientist (life sciences)  . Smokeless tobacco: Never Used  Substance and Sexual Activity  . Alcohol use: Yes    Alcohol/week: 5.4 oz    Types: 4 Cans of beer, 5 Shots of liquor per week  . Drug use: No  . Sexual activity: Not Currently  Lifestyle  . Physical  activity:    Days per week: Not on file    Minutes per session: Not on file  . Stress: Not on file  Relationships  . Social connections:    Talks on phone: Not on file    Gets together: Not on file    Attends religious service: Not on file    Active member of club or organization: Not on file    Attends meetings of clubs or organizations: Not on file    Relationship status: Not on file  . Intimate partner violence:    Fear of current or ex partner: Not on file    Emotionally abused: Not on file    Physically abused: Not on file    Forced sexual activity: Not on file  Other Topics Concern  . Not on file  Social History Narrative  . Not on file    No Known Allergies  Outpatient Medications Prior to Visit  Medication Sig Dispense Refill  . aspirin EC 81 MG tablet Take 81 mg by mouth daily.    Marland Kitchen ALPRAZolam (XANAX) 0.25 MG tablet Take 1 tablet (0.25 mg total) by mouth as needed. 30 tablet 0  . levothyroxine (SYNTHROID, LEVOTHROID) 50 MCG tablet Take 1 tablet (50 mcg total) by mouth  daily. 30 tablet 11  . omeprazole (PRILOSEC) 20 MG capsule Take 1 capsule (20 mg total) by mouth 2 (two) times daily before a meal. (Patient taking differently: Take 20 mg by mouth daily. ) 90 capsule 3  . sertraline (ZOLOFT) 50 MG tablet Take 1 tablet (50 mg total) by mouth daily. 30 tablet 11  . Aspirin-Acetaminophen-Caffeine 951-884-16 MG PACK Take by mouth.    . ranitidine ([facility] RANITIDINE) 75 MG tablet Take 1 tablet (75 mg total) by mouth as needed. otc (Patient not taking: Reported on 01/22/2018) 90 tablet 3   No facility-administered medications prior to visit.     Review of Systems  Constitutional: Negative for appetite change, chills, diaphoresis, fatigue, fever, irritability, malaise/fatigue, weight gain and weight loss.  HENT: Negative for ear discharge, ear pain, hoarse voice and sore throat.   Eyes: Negative for blurred vision.  Respiratory: Negative for cough, sputum production, choking,  shortness of breath and wheezing.   Cardiovascular: Negative for chest pain, palpitations and leg swelling.  Gastrointestinal: Negative for abdominal pain, blood in stool, constipation, diarrhea, dysphagia, heartburn, melena and nausea.  Genitourinary: Negative for dysuria, frequency, hematuria, menstrual problem and urgency.  Musculoskeletal: Negative for back pain, joint pain, myalgias, muscle weakness and neck pain.  Skin: Negative for rash.  Neurological: Negative for dizziness, tingling, tremors, sensory change, focal weakness and headaches.  Endo/Heme/Allergies: Negative for environmental allergies, cold intolerance, heat intolerance and polydipsia. Does not bruise/bleed easily.  Psychiatric/Behavioral: Positive for depression. Negative for confusion, decreased concentration and suicidal ideas. The patient is nervous/anxious. The patient does not have insomnia.      Objective  Vitals:   03/14/18 1028  BP: 108/70  Pulse: 76  Weight: 214 lb (97.1 kg)  Height: 5\' 6"  (1.676 m)    Physical Exam  Constitutional: She is well-developed, well-nourished, and in no distress. She is not irritable. No distress.  HENT:  Head: Normocephalic and atraumatic.  Right Ear: External ear normal.  Left Ear: External ear normal.  Nose: Nose normal.  Mouth/Throat: Oropharynx is clear and moist.  Eyes: Pupils are equal, round, and reactive to light. Conjunctivae and EOM are normal. Right eye exhibits no discharge. Left eye exhibits no discharge.  Neck: Normal range of motion. Neck supple. No JVD present. No thyromegaly present.  Cardiovascular: Normal rate, regular rhythm, normal heart sounds and intact distal pulses. Exam reveals no gallop and no friction rub.  No murmur heard. Pulmonary/Chest: Effort normal and breath sounds normal. She has no wheezes. She has no rales.  Abdominal: Soft. Bowel sounds are normal. She exhibits no mass. There is no tenderness. There is no guarding.  Musculoskeletal:  Normal range of motion. She exhibits no edema.  Lymphadenopathy:    She has no cervical adenopathy.  Neurological: She is alert. She has normal reflexes.  Skin: Skin is warm and dry. She is not diaphoretic.  Psychiatric: Mood and affect normal.  Nursing note and vitals reviewed.     Assessment & Plan  Problem List Items Addressed This Visit      Cardiovascular and Mediastinum   Paroxysmal supraventricular tachycardia (HCC)     Digestive   GIB (gastrointestinal bleeding)   Relevant Orders   Hemoglobin     Endocrine   Hypothyroidism   Relevant Medications   levothyroxine (SYNTHROID, LEVOTHROID) 50 MCG tablet   Other Relevant Orders   TSH   Renal Function Panel     Other   Panic disorder   Relevant Medications   ALPRAZolam (XANAX) 0.25  MG tablet   sertraline (ZOLOFT) 50 MG tablet   Mild episode of recurrent major depressive disorder (HCC) - Primary   Relevant Medications   ALPRAZolam (XANAX) 0.25 MG tablet   sertraline (ZOLOFT) 50 MG tablet   Mixed hyperlipidemia   Relevant Orders   Lipid panel   Renal Function Panel    Other Visit Diagnoses    H. pylori infection       Relevant Medications   omeprazole (PRILOSEC) 20 MG capsule   Iron deficiency anemia due to chronic blood loss       Relevant Orders   Hemoglobin      Meds ordered this encounter  Medications  . DISCONTD: omeprazole (PRILOSEC) 20 MG capsule    Sig: Take 1 capsule (20 mg total) by mouth daily for 14 days.    Dispense:  30 capsule    Refill:  11  . DISCONTD: levothyroxine (SYNTHROID, LEVOTHROID) 50 MCG tablet    Sig: Take 1 tablet (50 mcg total) by mouth daily.    Dispense:  30 tablet    Refill:  11    Needs appt for meds  . DISCONTD: sertraline (ZOLOFT) 50 MG tablet    Sig: Take 1 tablet (50 mg total) by mouth daily.    Dispense:  30 tablet    Refill:  11  . ALPRAZolam (XANAX) 0.25 MG tablet    Sig: Take 1 tablet (0.25 mg total) by mouth as needed.    Dispense:  30 tablet    Refill:   0  . omeprazole (PRILOSEC) 20 MG capsule    Sig: Take 1 capsule (20 mg total) by mouth daily for 14 days.    Dispense:  30 capsule    Refill:  5  . levothyroxine (SYNTHROID, LEVOTHROID) 50 MCG tablet    Sig: Take 1 tablet (50 mcg total) by mouth daily.    Dispense:  30 tablet    Refill:  5    Needs appt for meds  . sertraline (ZOLOFT) 50 MG tablet    Sig: Take 1 tablet (50 mg total) by mouth daily.    Dispense:  30 tablet    Refill:  5      Dr. Otilio Miu [facility]  03/14/18

## 2018-03-15 LAB — RENAL FUNCTION PANEL
ALBUMIN: 4.2 g/dL (ref 3.6–4.8)
BUN / CREAT RATIO: 20 (ref 12–28)
BUN: 14 mg/dL (ref 8–27)
CHLORIDE: 101 mmol/L (ref 96–106)
CO2: 23 mmol/L (ref 20–29)
Calcium: 9.1 mg/dL (ref 8.7–10.3)
Creatinine, Ser: 0.71 mg/dL (ref 0.57–1.00)
GFR, EST AFRICAN AMERICAN: 102 mL/min/{1.73_m2} (ref >59)
GFR, EST NON AFRICAN AMERICAN: 88 mL/min/{1.73_m2} (ref >59)
GLUCOSE: 100 mg/dL — AB (ref 65–99)
POTASSIUM: 4.5 mmol/L (ref 3.5–5.2)
Phosphorus: 3.8 mg/dL (ref 2.5–4.5)
Sodium: 140 mmol/L (ref 134–144)

## 2018-03-15 LAB — LIPID PANEL
Chol/HDL Ratio: 3.6 ratio (ref 0.0–4.4)
Cholesterol, Total: 243 mg/dL — ABNORMAL HIGH (ref 100–199)
HDL: 68 mg/dL (ref >39)
LDL CALC: 148 mg/dL — AB (ref 0–99)
Triglycerides: 136 mg/dL (ref 0–149)
VLDL CHOLESTEROL CAL: 27 mg/dL (ref 5–40)

## 2018-03-15 LAB — TSH: TSH: 2.24 u[IU]/mL (ref 0.450–4.500)

## 2018-03-15 LAB — HEMOGLOBIN: Hemoglobin: 14.4 g/dL (ref 11.1–15.9)

## 2018-10-11 ENCOUNTER — Other Ambulatory Visit: Payer: Self-pay

## 2018-10-11 DIAGNOSIS — E039 Hypothyroidism, unspecified: Secondary | ICD-10-CM

## 2018-10-11 MED ORDER — LEVOTHYROXINE SODIUM 50 MCG PO TABS: 50.0000 ug | ORAL_TABLET | Freq: Every day | ORAL | 0 refills | Status: DC

## 2018-10-22 ENCOUNTER — Encounter: Payer: Self-pay | Admitting: Family Medicine

## 2018-10-22 ENCOUNTER — Ambulatory Visit (INDEPENDENT_AMBULATORY_CARE_PROVIDER_SITE_OTHER): Payer: PPO | Admitting: Family Medicine

## 2018-10-22 VITALS — BP 110/76 | HR 60 | Ht 66.0 in | Wt 220.0 lb

## 2018-10-22 DIAGNOSIS — E7849 Other hyperlipidemia: Secondary | ICD-10-CM | POA: Diagnosis not present

## 2018-10-22 DIAGNOSIS — F330 Major depressive disorder, recurrent, mild: Secondary | ICD-10-CM | POA: Diagnosis not present

## 2018-10-22 DIAGNOSIS — F410 Panic disorder [episodic paroxysmal anxiety] without agoraphobia: Secondary | ICD-10-CM | POA: Diagnosis not present

## 2018-10-22 DIAGNOSIS — E039 Hypothyroidism, unspecified: Secondary | ICD-10-CM | POA: Diagnosis not present

## 2018-10-22 DIAGNOSIS — R739 Hyperglycemia, unspecified: Secondary | ICD-10-CM | POA: Diagnosis not present

## 2018-10-22 DIAGNOSIS — A048 Other specified bacterial intestinal infections: Secondary | ICD-10-CM

## 2018-10-22 MED ORDER — ALPRAZOLAM 0.25 MG PO TABS: 0.2500 mg | ORAL_TABLET | ORAL | 0 refills | Status: DC | PRN

## 2018-10-22 MED ORDER — OMEPRAZOLE 20 MG PO CPDR: 20.0000 mg | DELAYED_RELEASE_CAPSULE | Freq: Every day | ORAL | 5 refills | Status: DC

## 2018-10-22 MED ORDER — LEVOTHYROXINE SODIUM 50 MCG PO TABS: 50.0000 ug | ORAL_TABLET | Freq: Every day | ORAL | 0 refills | Status: DC

## 2018-10-22 MED ORDER — SERTRALINE HCL 50 MG PO TABS: 50.0000 mg | ORAL_TABLET | Freq: Every day | ORAL | 5 refills | Status: DC

## 2018-10-22 NOTE — Progress Notes (Signed)
Date:  10/22/2018   Name:  Brenda Campbell   DOB:  07-28-1951   MRN:  [id number]   Chief Complaint: Hypothyroidism; Depression; Gastroesophageal Reflux; and Anxiety Depression       The patient presents with depression.  This is a chronic problem.  The current episode started more than 1 year ago.   The onset quality is gradual.   The problem occurs intermittently.  The problem has been waxing and waning since onset.  Associated symptoms include sad.  Associated symptoms include no decreased concentration, no fatigue, no helplessness, no hopelessness, does not have insomnia, not irritable, no restlessness, no decreased interest, no appetite change, no body aches, no myalgias, no headaches, no indigestion and no suicidal ideas.  Past treatments include SSRIs - Selective serotonin reuptake inhibitors.  Compliance with treatment is good.  Risk factors include a change in medication usage/dosage.   Past medical history includes thyroid problem, anxiety and depression.   Gastroesophageal Reflux  She reports no abdominal pain, no belching, no chest pain, no choking, no coughing, no dysphagia, no early satiety, no heartburn, no hoarse voice, no nausea, no sore throat, no stridor or no wheezing. This is a recurrent problem. The problem has been unchanged. The symptoms are aggravated by certain foods. Pertinent negatives include no anemia, fatigue, melena, muscle weakness, orthopnea or weight loss. She has tried a PPI for the symptoms. The treatment provided significant relief. Past procedures do not include an abdominal ultrasound, an EGD, esophageal manometry, esophageal pH monitoring, H. pylori antibody titer or a UGI.  Anxiety  Presents for follow-up visit. Symptoms include depressed mood, nervous/anxious behavior and panic. Patient reports no chest pain, compulsions, decreased concentration, dizziness, excessive worry, hyperventilation, insomnia, nausea, palpitations, restlessness, shortness of  breath or suicidal ideas. The severity of symptoms is mild.   Her past medical history is significant for depression.  Thyroid Problem  Presents for follow-up visit. Symptoms include anxiety and depressed mood. Patient reports no cold intolerance, constipation, diaphoresis, diarrhea, dry skin, fatigue, hair loss, heat intolerance, hoarse voice, leg swelling, menstrual problem, nail problem, palpitations, tremors, visual change, weight gain or weight loss. The symptoms have been stable.     Review of Systems  Constitutional: Negative.  Negative for appetite change, chills, diaphoresis, fatigue, fever, unexpected weight change, weight gain and weight loss.  HENT: Negative for congestion, ear discharge, ear pain, hoarse voice, rhinorrhea, sinus pressure, sneezing and sore throat.   Eyes: Negative for photophobia, pain, discharge, redness and itching.  Respiratory: Negative for cough, choking, shortness of breath, wheezing and stridor.   Cardiovascular: Negative for chest pain and palpitations.  Gastrointestinal: Negative for abdominal pain, blood in stool, constipation, diarrhea, dysphagia, heartburn, melena, nausea and vomiting.  Endocrine: Negative for cold intolerance, heat intolerance, polydipsia, polyphagia and polyuria.  Genitourinary: Negative for dysuria, flank pain, frequency, hematuria, menstrual problem, pelvic pain, urgency, vaginal bleeding and vaginal discharge.  Musculoskeletal: Negative for arthralgias, back pain, myalgias and muscle weakness.  Skin: Negative for rash.  Allergic/Immunologic: Negative for environmental allergies and food allergies.  Neurological: Negative for dizziness, tremors, weakness, light-headedness, numbness and headaches.  Hematological: Negative for adenopathy. Does not bruise/bleed easily.  Psychiatric/Behavioral: Positive for depression. Negative for decreased concentration, dysphoric mood and suicidal ideas. The patient is nervous/anxious. The patient  does not have insomnia.     Patient Active Problem List   Diagnosis Date Noted  . Gastrointestinal hemorrhage with melena 06/28/2017  . Blood in stool   . Acute GI bleeding   .  Benign neoplasm of transverse colon   . GIB (gastrointestinal bleeding) 03/15/2017  . Panic disorder 03/13/2017  . Mild episode of recurrent major depressive disorder ([place]) 03/13/2017  . Mixed hyperlipidemia 12/23/2014  . Paroxysmal supraventricular tachycardia ([place]) 12/23/2014  . 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 Gastroesophageal reflux disease 12/22/2014  . Hypothyroidism 12/22/2014  . Obesity 12/22/2014  . Tachycardia 12/22/2014  . Malaise 12/22/2014    No Known Allergies  Past Surgical History:  Procedure Laterality Date  . CESAREAN SECTION     x 1  . COLONOSCOPY  10/12/2014   cleared for 3 years  . COLONOSCOPY N/A 03/16/2017   Procedure: COLONOSCOPY;  Surgeon: Lucilla Lame, MD;  Location: [facility] ENDOSCOPY;  Service: Endoscopy;  Laterality: N/A;  . COLONOSCOPY WITH PROPOFOL N/A 12/01/2017   Procedure: COLONOSCOPY WITH PROPOFOL;  Surgeon: [name], MD;  Location: [facility] ENDOSCOPY;  Service: Gastroenterology;  Laterality: N/A;  . ESOPHAGOGASTRODUODENOSCOPY (EGD) WITH PROPOFOL N/A 03/15/2017   Procedure: ESOPHAGOGASTRODUODENOSCOPY (EGD) WITH PROPOFOL;  Surgeon: [name], MD;  Location: [facility] ENDOSCOPY;  Service: Endoscopy;  Laterality: N/A;  . ESOPHAGOGASTRODUODENOSCOPY (EGD) WITH PROPOFOL N/A 03/30/2017   Procedure: ESOPHAGOGASTRODUODENOSCOPY (EGD) WITH PROPOFOL;  Surgeon: [name], MD;  Location: [facility] ENDOSCOPY;  Service: Endoscopy;  Laterality: N/A;  Endoscopic Capsule Placement  . ESOPHAGOGASTRODUODENOSCOPY (EGD) WITH PROPOFOL N/A 12/01/2017   Procedure: ESOPHAGOGASTRODUODENOSCOPY (EGD) WITH PROPOFOL;  Surgeon: [name], MD;  Location: [facility] ENDOSCOPY;  Service: Gastroenterology;  Laterality: N/A;  . GALLBLADDER SURGERY    . GIVENS CAPSULE STUDY N/A 03/17/2017    Procedure: GIVENS CAPSULE STUDY;  Surgeon: Lucilla Lame, MD;  Location: [facility] ENDOSCOPY;  Service: Endoscopy;  Laterality: N/A;  . GIVENS CAPSULE STUDY N/A 12/01/2017   Procedure: GIVENS CAPSULE STUDY, 12 HOUR;  Surgeon: [name], MD;  Location: [facility] ENDOSCOPY;  Service: Gastroenterology;  Laterality: N/A;  . 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 TIBIA FRACTURE SURGERY Right   . WRIST SURGERY Right    arthritis    Social History   Tobacco Use  . Smoking status: Former Research scientist (life sciences)  . Smokeless tobacco: Never Used  Substance Use Topics  . Alcohol use: Yes    Alcohol/week: 9.0 standard drinks    Types: 4 Cans of beer, 5 Shots of liquor per week  . Drug use: No     Medication list has been reviewed and updated.  Current Meds  Medication Sig  . ALPRAZolam (XANAX) 0.25 MG tablet Take 1 tablet (0.25 mg total) by mouth as needed.  Marland Kitchen aspirin EC 81 MG tablet Take 81 mg by mouth daily.  Marland Kitchen levothyroxine (SYNTHROID, LEVOTHROID) 50 MCG tablet Take 1 tablet (50 mcg total) by mouth daily.  Marland Kitchen omeprazole (PRILOSEC) 20 MG capsule Take 1 capsule (20 mg total) by mouth daily for 14 days.  Marland Kitchen sertraline (ZOLOFT) 50 MG tablet Take 1 tablet (50 mg total) by mouth daily.  . [DISCONTINUED] ALPRAZolam (XANAX) 0.25 MG tablet Take 1 tablet (0.25 mg total) by mouth as needed.  . [DISCONTINUED] levothyroxine (SYNTHROID, LEVOTHROID) 50 MCG tablet Take 1 tablet (50 mcg total) by mouth daily.  . [DISCONTINUED] omeprazole (PRILOSEC) 20 MG capsule Take 1 capsule (20 mg total) by mouth daily for 14 days.  . [DISCONTINUED] sertraline (ZOLOFT) 50 MG tablet Take 1 tablet (50 mg total) by mouth daily.    PHQ 2/9 Scores 10/22/2018 03/13/2017 12/30/2015  PHQ - 2 Score 1 0 0  PHQ- 9 Score 3 - -    Physical Exam  Constitutional: She is oriented to person, place, and time. She appears well-developed and well-nourished. She is not irritable.  HENT:  Head: Normocephalic.  Right Ear: External ear normal.  Left Ear:  External ear normal.  Mouth/Throat: Oropharynx is clear and moist.  Eyes: Pupils are equal, round, and reactive to light. Conjunctivae and EOM are normal. Lids are everted and swept, no foreign bodies found. Left eye exhibits no hordeolum. No foreign body present in the left eye. Right conjunctiva is not injected. Left conjunctiva is not injected. No scleral icterus.  Neck: Normal range of motion. Neck supple. No JVD present. No tracheal deviation present. No thyromegaly present.  Cardiovascular: Normal rate, regular rhythm, normal heart sounds and intact distal pulses. Exam reveals no gallop and no friction rub.  No murmur heard. Pulmonary/Chest: Effort normal and breath sounds normal. No respiratory distress. She has no wheezes. She has no rales.  Abdominal: Soft. Bowel sounds are normal. She exhibits no mass. There is no hepatosplenomegaly. There is no tenderness. There is no rebound and no guarding.  Musculoskeletal: Normal range of motion. She exhibits no edema or tenderness.  Lymphadenopathy:    She has no cervical adenopathy.  Neurological: She is alert and oriented to person, place, and time. She has normal strength. She displays normal reflexes. No cranial nerve deficit.  Skin: Skin is warm. No rash noted.  Psychiatric: She has a normal mood and affect. Her mood appears not anxious. She does not exhibit a depressed mood.  Nursing note and vitals reviewed.   BP 110/76   Pulse 60   Ht 5\' 6"  (1.676 m)   Wt 220 lb (99.8 kg)   BMI 35.51 kg/m   Assessment and Plan:  1. H. pylori infection Stable on med- refilled Omeprazole - omeprazole (PRILOSEC) 20 MG capsule; Take 1 capsule (20 mg total) by mouth daily for 14 days.  Dispense: 30 capsule; Refill: 5  2. Hypothyroidism, unspecified type Stable on med- refill levothyroxine/draw TSH - levothyroxine (SYNTHROID, LEVOTHROID) 50 MCG tablet; Take 1 tablet (50 mcg total) by mouth daily.  Dispense: 30 tablet; Refill: 0 - TSH  3. Mild  episode of recurrent major depressive disorder (HCC) Stable on med- refill Sertraline - sertraline (ZOLOFT) 50 MG tablet; Take 1 tablet (50 mg total) by mouth daily.  Dispense: 30 tablet; Refill: 5  4. Panic disorder Stable on med- refill Alprazolam - ALPRAZolam (XANAX) 0.25 MG tablet; Take 1 tablet (0.25 mg total) by mouth as needed.  Dispense: 30 tablet; Refill: 0  5. Familial multiple lipoprotein-type hyperlipidemia Draw lipid - Lipid panel  6. Hyperglycemia Draw renal and lipid - Renal Function Panel - Lipid panel   Dr. Otilio Miu [facility]  10/22/2018

## 2018-10-23 LAB — RENAL FUNCTION PANEL
Albumin: 4.1 g/dL (ref 3.6–4.8)
BUN / CREAT RATIO: 18 (ref 12–28)
BUN: 13 mg/dL (ref 8–27)
CHLORIDE: 103 mmol/L (ref 96–106)
CO2: 25 mmol/L (ref 20–29)
Calcium: 9.1 mg/dL (ref 8.7–10.3)
Creatinine, Ser: 0.72 mg/dL (ref 0.57–1.00)
GFR calc non Af Amer: 87 mL/min/{1.73_m2} (ref >59)
GFR, EST AFRICAN AMERICAN: 100 mL/min/{1.73_m2} (ref >59)
GLUCOSE: 101 mg/dL — AB (ref 65–99)
POTASSIUM: 4.5 mmol/L (ref 3.5–5.2)
Phosphorus: 3.6 mg/dL (ref 2.5–4.5)
Sodium: 141 mmol/L (ref 134–144)

## 2018-10-23 LAB — LIPID PANEL
Chol/HDL Ratio: 4.3 ratio (ref 0.0–4.4)
Cholesterol, Total: 241 mg/dL — ABNORMAL HIGH (ref 100–199)
HDL: 56 mg/dL (ref >39)
LDL Calculated: 158 mg/dL — ABNORMAL HIGH (ref 0–99)
Triglycerides: 136 mg/dL (ref 0–149)
VLDL Cholesterol Cal: 27 mg/dL (ref 5–40)

## 2018-10-23 LAB — TSH: TSH: 2.65 u[IU]/mL (ref 0.450–4.500)

## 2018-10-24 DIAGNOSIS — D3610 Benign neoplasm of peripheral nerves and autonomic nervous system, unspecified: Secondary | ICD-10-CM | POA: Diagnosis not present

## 2018-10-24 DIAGNOSIS — L821 Other seborrheic keratosis: Secondary | ICD-10-CM | POA: Diagnosis not present

## 2018-10-24 DIAGNOSIS — Z86018 Personal history of other benign neoplasm: Secondary | ICD-10-CM | POA: Diagnosis not present

## 2018-10-24 DIAGNOSIS — Z1283 Encounter for screening for malignant neoplasm of skin: Secondary | ICD-10-CM | POA: Diagnosis not present

## 2018-10-24 DIAGNOSIS — L578 Other skin changes due to chronic exposure to nonionizing radiation: Secondary | ICD-10-CM | POA: Diagnosis not present

## 2018-10-24 DIAGNOSIS — L853 Xerosis cutis: Secondary | ICD-10-CM | POA: Diagnosis not present

## 2018-12-11 ENCOUNTER — Other Ambulatory Visit: Payer: Self-pay

## 2018-12-11 ENCOUNTER — Encounter: Payer: Self-pay | Admitting: Emergency Medicine

## 2018-12-11 ENCOUNTER — Ambulatory Visit
Admission: EM | Admit: 2018-12-11 | Discharge: 2018-12-11 | Disposition: A | Discharge status: Home / Self Care | Payer: PPO | Attending: Family Medicine | Admitting: Family Medicine

## 2018-12-11 DIAGNOSIS — J069 Acute upper respiratory infection, unspecified: Secondary | ICD-10-CM | POA: Insufficient documentation

## 2018-12-11 LAB — RAPID INFLUENZA A&B ANTIGENS (ARMC ONLY): INFLUENZA B (ARMC): NEGATIVE

## 2018-12-11 LAB — RAPID INFLUENZA A&B ANTIGENS: Influenza A (ARMC): NEGATIVE

## 2018-12-11 MED ORDER — ALBUTEROL SULFATE HFA 108 (90 BASE) MCG/ACT IN AERS: 1.0000 | INHALATION_SPRAY | Freq: Four times a day (QID) | RESPIRATORY_TRACT | 0 refills | Status: DC | PRN

## 2018-12-11 MED ORDER — HYDROCOD POLST-CPM POLST ER 10-8 MG/5ML PO SUER: 2.5000 mL | Freq: Two times a day (BID) | ORAL | 0 refills | Status: DC

## 2018-12-11 MED ORDER — BENZONATATE 200 MG PO CAPS: ORAL_CAPSULE | ORAL | 0 refills | Status: DC

## 2018-12-11 NOTE — ED Provider Notes (Signed)
[facility]    CSN: [id number] Arrival date & time: 12/11/18  1348     History   Chief Complaint Chief Complaint  Patient presents with  . Cough    HPI Brenda Campbell is a 67 y.o. female.   HPI  67 year old female presents with cough wheezing runny nose and fever 100.2 that started 3 days ago.  Currently her temperature is 98.1 pulse rate 66 respirations 18 O2 sats 98%.        Past Medical History:  Diagnosis Date  . Anxiety   . Depression   . GERD (gastroesophageal reflux disease)   . Hyperlipidemia   . Hypertension   . Thyroid disease     Patient Active Problem List   Diagnosis Date Noted  . Gastrointestinal hemorrhage with melena 06/28/2017  . Blood in stool   . Acute GI bleeding   . Benign neoplasm of transverse colon   . GIB (gastrointestinal bleeding) 03/15/2017  . Panic disorder 03/13/2017  . Mild episode of recurrent major depressive disorder ([place]) 03/13/2017  . Mixed hyperlipidemia 12/23/2014  . Paroxysmal supraventricular tachycardia ([place]) 12/23/2014  . 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 Gastroesophageal reflux disease 12/22/2014  . Hypothyroidism 12/22/2014  . Obesity 12/22/2014  . Tachycardia 12/22/2014  . Malaise 12/22/2014    Past Surgical History:  Procedure Laterality Date  . CESAREAN SECTION     x 1  . COLONOSCOPY  10/12/2014   cleared for 3 years  . COLONOSCOPY N/A 03/16/2017   Procedure: COLONOSCOPY;  Surgeon: Lucilla Lame, MD;  Location: [facility] ENDOSCOPY;  Service: Endoscopy;  Laterality: N/A;  . COLONOSCOPY WITH PROPOFOL N/A 12/01/2017   Procedure: COLONOSCOPY WITH PROPOFOL;  Surgeon: [name], MD;  Location: [facility] ENDOSCOPY;  Service: Gastroenterology;  Laterality: N/A;  . ESOPHAGOGASTRODUODENOSCOPY (EGD) WITH PROPOFOL N/A 03/15/2017   Procedure: ESOPHAGOGASTRODUODENOSCOPY (EGD) WITH PROPOFOL;  Surgeon: [name], MD;  Location: [facility] ENDOSCOPY;  Service: Endoscopy;  Laterality: N/A;   . ESOPHAGOGASTRODUODENOSCOPY (EGD) WITH PROPOFOL N/A 03/30/2017   Procedure: ESOPHAGOGASTRODUODENOSCOPY (EGD) WITH PROPOFOL;  Surgeon: [name], MD;  Location: [facility] ENDOSCOPY;  Service: Endoscopy;  Laterality: N/A;  Endoscopic Capsule Placement  . ESOPHAGOGASTRODUODENOSCOPY (EGD) WITH PROPOFOL N/A 12/01/2017   Procedure: ESOPHAGOGASTRODUODENOSCOPY (EGD) WITH PROPOFOL;  Surgeon: [name], MD;  Location: [facility] ENDOSCOPY;  Service: Gastroenterology;  Laterality: N/A;  . GALLBLADDER SURGERY    . GIVENS CAPSULE STUDY N/A 03/17/2017   Procedure: GIVENS CAPSULE STUDY;  Surgeon: Lucilla Lame, MD;  Location: [facility] ENDOSCOPY;  Service: Endoscopy;  Laterality: N/A;  . GIVENS CAPSULE STUDY N/A 12/01/2017   Procedure: GIVENS CAPSULE STUDY, 12 HOUR;  Surgeon: [name], MD;  Location:  [facility] ENDOSCOPY;  Service: Gastroenterology;  Laterality: N/A;  . 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 TIBIA FRACTURE SURGERY Right   . WRIST SURGERY Right    arthritis    OB History   No obstetric history on file.      Home Medications    Prior to Admission medications   Medication Sig Start Date End Date Taking? Authorizing Provider  ALPRAZolam (XANAX) 0.25 MG tablet Take 1 tablet (0.25 mg total) by mouth as needed. 10/22/18  Yes Juline Patch, MD  aspirin EC 81 MG tablet Take 81 mg by mouth daily.   Yes [provider]  levothyroxine (SYNTHROID, LEVOTHROID) 50 MCG tablet Take 1 tablet (50 mcg total) by mouth daily. 10/22/18  Yes Juline Patch, MD  omeprazole (PRILOSEC) 20 MG capsule Take  1 capsule (20 mg total) by mouth daily for 14 days. 10/22/18 12/11/18 Yes Juline Patch, MD  sertraline (ZOLOFT) 50 MG tablet Take 1 tablet (50 mg total) by mouth daily. 10/22/18  Yes Juline Patch, MD  albuterol (PROVENTIL HFA;VENTOLIN HFA) 108 (90 Base) MCG/ACT inhaler Inhale 1-2 puffs into the lungs every 6 (six) hours as needed for wheezing or shortness of breath. Use with spacer 12/11/18   Lorin Picket, PA-C  benzonatate (TESSALON) 200 MG capsule Take one cap TID PRN cough 12/11/18   Lorin Picket, PA-C  chlorpheniramine-HYDROcodone [facility] ER) 10-8 MG/5ML SUER Take 2.5 mLs by mouth 2 (two) times daily. 12/11/18   Lorin Picket, PA-C    Family History Family History  Problem Relation Age of Onset  . Diabetes Mother   . Heart disease Mother     Social History Social History   Tobacco Use  . Smoking status: Former Research scientist (life sciences)  . Smokeless tobacco: Never Used  Substance Use Topics  . Alcohol use: Yes    Alcohol/week: 9.0 standard drinks    Types: 4 Cans of beer, 5 Shots of liquor per week  . Drug use: No     Allergies   Patient has no known allergies.   Review of Systems Review of Systems  Constitutional: Positive for activity change, chills, fatigue and fever.  HENT: Positive for congestion and postnasal drip.   Respiratory: Positive for cough.   All other systems reviewed and are negative.    Physical Exam Triage Vital Signs ED Triage Vitals  Enc Vitals Group     BP 12/11/18 1408 134/76     Pulse Rate 12/11/18 1408 66     Resp 12/11/18 1408 18     Temp 12/11/18 1408 98.1 F (36.7 C)     Temp Source 12/11/18 1408 Oral     SpO2 12/11/18 1408 98 %     Weight 12/11/18 1404 220 lb (99.8 kg)     Height 12/11/18 1404 5\' 7"  (1.702 m)     Head Circumference --      Peak Flow --      Pain Score 12/11/18 1404 0     Pain Loc --      Pain Edu? --      Excl. in [place]? --    No data found.  Updated Vital Signs BP 134/76 (BP Location: Left Arm)   Pulse 66   Temp 98.1 F (36.7 C) (Oral)   Resp 18   Ht 5\' 7"  (1.702 m)   Wt 220 lb (99.8 kg)   SpO2 98%   BMI 34.46 kg/m   Visual Acuity Right Eye Distance:   Left Eye Distance:   Bilateral Distance:    Right Eye Near:   Left Eye Near:    Bilateral Near:     Physical Exam Vitals signs and nursing note reviewed.  Constitutional:      General: She is not in acute distress.     Appearance: Normal appearance. She is not ill-appearing, toxic-appearing or diaphoretic.  HENT:     Head: Normocephalic.     Right Ear: Tympanic membrane and ear canal normal.     Left Ear: Tympanic membrane and ear canal normal.     Nose: Nose normal.     Mouth/Throat:     Mouth: Mucous membranes are moist.     Pharynx: No oropharyngeal exudate.  Eyes:     General:        Right  eye: No discharge.        Left eye: No discharge.     Conjunctiva/sclera: Conjunctivae normal.     Pupils: Pupils are equal, round, and reactive to light.  Neck:     Musculoskeletal: Normal range of motion and neck supple.  Pulmonary:     Effort: Pulmonary effort is normal.     Breath sounds: Normal breath sounds.  Musculoskeletal: Normal range of motion.  Lymphadenopathy:     Cervical: No cervical adenopathy.  Skin:    General: Skin is warm and dry.  Neurological:     General: No focal deficit present.     Mental Status: She is alert and oriented to person, place, and time.  Psychiatric:        Mood and Affect: Mood normal.        Behavior: Behavior normal.        Thought Content: Thought content normal.        Judgment: Judgment normal.      UC Treatments / Results  Labs (all labs ordered are listed, but only abnormal results are displayed) Labs Reviewed  RAPID INFLUENZA A&B ANTIGENS ([place])    EKG None  Radiology No results found.  Procedures Procedures (including critical care time)  Medications Ordered in UC Medications - No data to display  Initial Impression / Assessment and Plan / UC Course  I have reviewed the triage vital signs and the nursing notes.  Pertinent labs & imaging results that were available during my care of the patient were reviewed by me and considered in my medical decision making (see chart for details).   Has an upper respiratory infection does not require antibiotics at this time.  Likely viral.  We will treat her symptomatically.  Is not improving  or worsen she should follow-up with her primary care physician   Final Clinical Impressions(s) / UC Diagnoses   Final diagnoses:  Upper respiratory tract infection, unspecified type   Discharge Instructions   None    ED Prescriptions    Medication Sig Dispense Auth. Provider   albuterol (PROVENTIL HFA;VENTOLIN HFA) 108 (90 Base) MCG/ACT inhaler Inhale 1-2 puffs into the lungs every 6 (six) hours as needed for wheezing or shortness of breath. Use with spacer 1 Inhaler Lorin Picket, PA-C   benzonatate (TESSALON) 200 MG capsule Take one cap TID PRN cough 30 capsule Lorin Picket, PA-C   chlorpheniramine-HYDROcodone (TUSSIONEX PENNKINETIC ER) 10-8 MG/5ML SUER Take 2.5 mLs by mouth 2 (two) times daily. 60 mL Lorin Picket, PA-C     Controlled Substance Prescriptions [place] Controlled Substance Registry consulted? Not Applicable   Lorin Picket, PA-C 12/11/18 2001

## 2018-12-11 NOTE — ED Triage Notes (Signed)
Pt c/o cough, wheezing, runny nose, and fever (100.2). Started 3 days ago.

## 2018-12-13 ENCOUNTER — Telehealth: Payer: Self-pay

## 2018-12-13 ENCOUNTER — Other Ambulatory Visit: Payer: Self-pay

## 2018-12-13 DIAGNOSIS — J0110 Acute frontal sinusitis, unspecified: Secondary | ICD-10-CM

## 2018-12-13 MED ORDER — AZITHROMYCIN 250 MG PO TABS: ORAL_TABLET | ORAL | 0 refills | Status: DC

## 2018-12-13 NOTE — Telephone Encounter (Signed)
Pt called in stating that she was seen in UC on 12/11/18- was given inhaler, tessalon perles and cough syrup for URI- is not getting any etter and has requested an antibiotic. ZPack was sent in to [place]- pt informed if not better, need to see.

## 2019-03-03 ENCOUNTER — Telehealth: Payer: PPO | Admitting: Family

## 2019-03-03 DIAGNOSIS — R399 Unspecified symptoms and signs involving the genitourinary system: Secondary | ICD-10-CM | POA: Diagnosis not present

## 2019-03-03 MED ORDER — SULFAMETHOXAZOLE-TRIMETHOPRIM 800-160 MG PO TABS: 1.0000 | ORAL_TABLET | Freq: Two times a day (BID) | ORAL | 0 refills | Status: DC

## 2019-03-03 NOTE — Progress Notes (Signed)
We are sorry that you are not feeling well.  Here is how we plan to help!  Based on what you shared with me it looks like you most likely have a simple urinary tract infection.  A UTI (Urinary Tract Infection) is a bacterial infection of the bladder.  Most cases of urinary tract infections are simple to treat but a key part of your care is to encourage you to drink plenty of fluids and watch your symptoms carefully.  I have prescribed Bactrim DS One tablet twice a day for 5 days.  Your symptoms should gradually improve. Call us if the burning in your urine worsens, you develop worsening fever, back pain or pelvic pain or if your symptoms do not resolve after completing the antibiotic.  Approximately 5 minutes was spent documenting and reviewing patient's chart.    Urinary tract infections can be prevented by drinking plenty of water to keep your body hydrated.  Also be sure when you wipe, wipe from front to back and don't hold it in!  If possible, empty your bladder every 4 hours.  Your e-visit answers were reviewed by a board certified advanced clinical practitioner to complete your personal care plan.  Depending on the condition, your plan could have included both over the counter or prescription medications.  If there is a problem please reply  once you have received a response from your provider.  Your safety is important to [place].  If you have drug allergies check your prescription carefully.    You can use MyChart to ask questions about today's visit, request a non-urgent call back, or ask for a work or school excuse for 24 hours related to this e-Visit. If it has been greater than 24 hours you will need to follow up with your provider, or enter a new e-Visit to address those concerns.   You will get an e-mail in the next two days asking about your experience.  I hope that your e-visit has been valuable and will speed your recovery. Thank you for using e-visits.

## 2019-06-18 DIAGNOSIS — Z03818 Encounter for observation for suspected exposure to other biological agents ruled out: Secondary | ICD-10-CM | POA: Diagnosis not present

## 2019-06-24 ENCOUNTER — Other Ambulatory Visit: Payer: Self-pay | Admitting: Family Medicine

## 2019-06-24 DIAGNOSIS — E039 Hypothyroidism, unspecified: Secondary | ICD-10-CM

## 2019-07-08 ENCOUNTER — Ambulatory Visit (INDEPENDENT_AMBULATORY_CARE_PROVIDER_SITE_OTHER): Payer: PPO | Admitting: Family Medicine

## 2019-07-08 ENCOUNTER — Encounter: Payer: Self-pay | Admitting: Family Medicine

## 2019-07-08 ENCOUNTER — Other Ambulatory Visit: Payer: Self-pay

## 2019-07-08 VITALS — BP 120/80 | HR 72 | Ht 67.0 in | Wt 207.0 lb

## 2019-07-08 DIAGNOSIS — Z862 Personal history of diseases of the blood and blood-forming organs and certain disorders involving the immune mechanism: Secondary | ICD-10-CM

## 2019-07-08 DIAGNOSIS — F410 Panic disorder [episodic paroxysmal anxiety] without agoraphobia: Secondary | ICD-10-CM | POA: Diagnosis not present

## 2019-07-08 DIAGNOSIS — A048 Other specified bacterial intestinal infections: Secondary | ICD-10-CM | POA: Diagnosis not present

## 2019-07-08 DIAGNOSIS — E039 Hypothyroidism, unspecified: Secondary | ICD-10-CM

## 2019-07-08 DIAGNOSIS — E7801 Familial hypercholesterolemia: Secondary | ICD-10-CM | POA: Diagnosis not present

## 2019-07-08 DIAGNOSIS — Z87440 Personal history of urinary (tract) infections: Secondary | ICD-10-CM

## 2019-07-08 DIAGNOSIS — F330 Major depressive disorder, recurrent, mild: Secondary | ICD-10-CM | POA: Diagnosis not present

## 2019-07-08 LAB — POCT URINALYSIS DIPSTICK
Bilirubin, UA: NEGATIVE
Blood, UA: NEGATIVE
Glucose, UA: NEGATIVE
Ketones, UA: NEGATIVE
Leukocytes, UA: NEGATIVE
Nitrite, UA: NEGATIVE
Protein, UA: NEGATIVE
Spec Grav, UA: 1.01 (ref 1.010–1.025)
Urobilinogen, UA: 0.2 E.U./dL
pH, UA: 5 (ref 5.0–8.0)

## 2019-07-08 MED ORDER — LEVOTHYROXINE SODIUM 50 MCG PO TABS: 50.0000 ug | ORAL_TABLET | Freq: Every day | ORAL | 5 refills | Status: DC

## 2019-07-08 MED ORDER — SERTRALINE HCL 50 MG PO TABS: 50.0000 mg | ORAL_TABLET | Freq: Every day | ORAL | 5 refills | Status: DC

## 2019-07-08 MED ORDER — ALPRAZOLAM 0.25 MG PO TABS: 0.2500 mg | ORAL_TABLET | ORAL | 1 refills | Status: DC | PRN

## 2019-07-08 MED ORDER — OMEPRAZOLE 20 MG PO CPDR: 20.0000 mg | DELAYED_RELEASE_CAPSULE | Freq: Every day | ORAL | 5 refills | Status: DC

## 2019-07-08 NOTE — Progress Notes (Signed)
Date:  07/08/2019   Name:  Brenda Campbell   DOB:  Jun 20, 1951   MRN:  [id number]   Chief Complaint: Depression (PHQ9=0 and GAD7=0), Hypothyroidism, Anemia (wants hgb checked due to hx of anemia), and Hyperlipidemia (mixed hyperlipidemia)  Depression        This is a chronic problem.  The current episode started more than 1 year ago.   The onset quality is gradual.   The problem occurs intermittently.  The problem has been gradually improving since onset.  Associated symptoms include fatigue.  Associated symptoms include no decreased concentration, no helplessness, no hopelessness, does not have insomnia, not irritable, no restlessness, no decreased interest, no appetite change, no body aches, no myalgias, no headaches, no indigestion and not sad.     Exacerbated by: stress in general.  Past treatments include SSRIs - Selective serotonin reuptake inhibitors.  Compliance with treatment is good.  Previous treatment provided mild relief.  Past medical history includes thyroid problem and anxiety.     Pertinent negatives include no hypothyroidism. Anemia Presents for follow-up visit. There has been no abdominal pain, anorexia, bruising/bleeding easily, confusion, fever, leg swelling, light-headedness, malaise/fatigue, pallor, palpitations, paresthesias or weight loss. Signs of blood loss that are not present include hematemesis, hematochezia, melena, menorrhagia and vaginal bleeding. There is no history of chronic renal disease or hypothyroidism. There are no compliance problems.   Hyperlipidemia This is a chronic problem. The current episode started more than 1 year ago. The problem is controlled. Recent lipid tests were reviewed and are normal. She has no history of chronic renal disease, diabetes, hypothyroidism, liver disease, obesity or nephrotic syndrome. There are no known factors aggravating her hyperlipidemia. Pertinent negatives include no chest pain, focal sensory loss, focal weakness,  leg pain, myalgias or shortness of breath. Current antihyperlipidemic treatment includes statins. The current treatment provides moderate improvement of lipids. There are no compliance problems.  Risk factors for coronary artery disease include dyslipidemia, hypertension and post-menopausal.  Thyroid Problem Presents for follow-up visit. Symptoms include fatigue and weight gain. Patient reports no anxiety, cold intolerance, constipation, depressed mood, diaphoresis, diarrhea, dry skin, hair loss, heat intolerance, hoarse voice, leg swelling, menstrual problem, nail problem, palpitations, tremors, visual change or weight loss. The symptoms have been stable. Her past medical history is significant for hyperlipidemia. There is no history of diabetes.    Review of Systems  Constitutional: Positive for fatigue and weight gain. Negative for appetite change, chills, diaphoresis, fever, malaise/fatigue, unexpected weight change and weight loss.  HENT: Negative for congestion, ear discharge, ear pain, hoarse voice, rhinorrhea, sinus pressure, sneezing and sore throat.   Eyes: Negative for photophobia, pain, discharge, redness and itching.  Respiratory: Negative for cough, shortness of breath, wheezing and stridor.   Cardiovascular: Negative for chest pain and palpitations.  Gastrointestinal: Negative for abdominal pain, anorexia, blood in stool, constipation, diarrhea, hematemesis, hematochezia, melena, nausea and vomiting.  Endocrine: Negative for cold intolerance, heat intolerance, polydipsia, polyphagia and polyuria.  Genitourinary: Negative for dysuria, flank pain, frequency, hematuria, menorrhagia, menstrual problem, pelvic pain, urgency, vaginal bleeding and vaginal discharge.  Musculoskeletal: Negative for arthralgias, back pain and myalgias.  Skin: Negative for pallor and rash.  Allergic/Immunologic: Negative for environmental allergies and food allergies.  Neurological: Negative for dizziness,  tremors, focal weakness, weakness, light-headedness, numbness, headaches and paresthesias.  Hematological: Negative for adenopathy. Does not bruise/bleed easily.  Psychiatric/Behavioral: Positive for depression. Negative for confusion, decreased concentration and dysphoric mood. The patient is not nervous/anxious and does  not have insomnia.     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   Gastroesophageal reflux disease 12/22/2014   Hypothyroidism 12/22/2014   Obesity 12/22/2014   Tachycardia 12/22/2014   Malaise 12/22/2014    No Known Allergies  Past Surgical History:  Procedure Laterality Date   CESAREAN SECTION     x 1   COLONOSCOPY  10/12/2014   cleared for 3 years   COLONOSCOPY N/A 03/16/2017   Procedure: COLONOSCOPY;  Surgeon: Lucilla Lame, MD;  Location: [facility] ENDOSCOPY;  Service: Endoscopy;  Laterality: N/A;   COLONOSCOPY WITH PROPOFOL N/A 12/01/2017   Procedure: COLONOSCOPY WITH PROPOFOL;  Surgeon: [name], MD;  Location: [facility] ENDOSCOPY;  Service: Gastroenterology;  Laterality: N/A;   ESOPHAGOGASTRODUODENOSCOPY (EGD) WITH PROPOFOL N/A 03/15/2017   Procedure: ESOPHAGOGASTRODUODENOSCOPY (EGD) WITH PROPOFOL;  Surgeon: [name], MD;  Location: [facility] ENDOSCOPY;  Service: Endoscopy;  Laterality: N/A;   ESOPHAGOGASTRODUODENOSCOPY (EGD) WITH PROPOFOL N/A 03/30/2017   Procedure: ESOPHAGOGASTRODUODENOSCOPY (EGD) WITH PROPOFOL;  Surgeon: [name], MD;  Location: [facility] ENDOSCOPY;  Service: Endoscopy;  Laterality: N/A;  Endoscopic  Capsule Placement   ESOPHAGOGASTRODUODENOSCOPY (EGD) WITH PROPOFOL N/A 12/01/2017   Procedure: ESOPHAGOGASTRODUODENOSCOPY (EGD) WITH PROPOFOL;  Surgeon: [name], MD;  Location[facility] ENDOSCOPY;  Service: Gastroenterology;  Laterality: N/A;   GALLBLADDER SURGERY     GIVENS CAPSULE STUDY N/A 03/17/2017   Procedure: GIVENS CAPSULE STUDY;  Surgeon: Lucilla Lame, MD;  Location: [facility] ENDOSCOPY;  Service: Endoscopy;  Laterality: N/A;   GIVENS CAPSULE STUDY N/A 12/01/2017   Procedure: GIVENS CAPSULE STUDY, 12 HOUR;  Surgeon: [name], MD;  Location: [facility] ENDOSCOPY;  Service: Gastroenterology;  Laterality: N/A;   THROAT SURGERY     nodule on vocal chord   TIBIA FRACTURE SURGERY Right    WRIST SURGERY Right    arthritis    Social History   Tobacco Use   Smoking status: Former Smoker   Smokeless tobacco: Never Used  Substance Use Topics   Alcohol use: Yes    Alcohol/week: 9.0 standard drinks    Types: 4 Cans of beer, 5 Shots of liquor per week   Drug use: No     Medication list has been reviewed and updated.  Current Meds  Medication Sig   albuterol (PROVENTIL HFA;VENTOLIN HFA) 108 (90 Base) MCG/ACT inhaler Inhale 1-2 puffs into the lungs every 6 (six) hours as needed for wheezing or shortness of breath. Use with spacer   ALPRAZolam (XANAX) 0.25 MG tablet Take 1 tablet (0.25 mg total) by mouth as needed.   aspirin EC 81 MG tablet Take 81 mg by mouth daily.   levothyroxine (SYNTHROID) 50 MCG tablet Take 1 tablet by mouth once daily   sertraline (ZOLOFT) 50 MG tablet Take 1 tablet (50 mg total) by mouth daily.    PHQ 2/9 Scores 07/08/2019 10/22/2018 03/13/2017 12/30/2015  PHQ - 2 Score 0 1 0 0  PHQ- 9 Score 0 3 - -    BP Readings from Last 3 Encounters:  07/08/19 120/80  12/11/18 134/76  10/22/18 110/76    Physical Exam Vitals signs and nursing note  reviewed.  Constitutional:      General: She is not irritable.    Appearance: She is well-developed.  HENT:      Head: Normocephalic.     Right Ear: Tympanic membrane, ear canal and external ear normal.     Left Ear: Tympanic membrane, ear canal and external ear normal.     Nose: Nose normal. No congestion or rhinorrhea.     Mouth/Throat:     Mouth: Mucous membranes are moist.  Eyes:     General: Lids are everted, no foreign bodies appreciated. No scleral icterus.       Left eye: No foreign body or hordeolum.     Conjunctiva/sclera: Conjunctivae normal.     Right eye: Right conjunctiva is not injected.     Left eye: Left conjunctiva is not injected.     Pupils: Pupils are equal, round, and reactive to light.  Neck:     Musculoskeletal: Normal range of motion and neck supple.     Thyroid: No thyromegaly.     Vascular: No JVD.     Trachea: No tracheal deviation.  Cardiovascular:     Rate and Rhythm: Normal rate and regular rhythm.     Heart sounds: Normal heart sounds. No murmur. No friction rub. No gallop.   Pulmonary:     Effort: Pulmonary effort is normal. No respiratory distress.     Breath sounds: Normal breath sounds. No wheezing, rhonchi or rales.  Abdominal:     General: Bowel sounds are normal.     Palpations: Abdomen is soft. There is no mass.     Tenderness: There is no abdominal tenderness. There is no guarding or rebound.  Musculoskeletal: Normal range of motion.        General: No tenderness.  Lymphadenopathy:     Cervical: No cervical adenopathy.  Skin:    General: Skin is warm.     Findings: No rash.  Neurological:     Mental Status: She is alert and oriented to person, place, and time.     Cranial Nerves: No cranial nerve deficit.     Deep Tendon Reflexes: Reflexes normal.  Psychiatric:        Mood and Affect: Mood is not anxious or depressed.     Wt Readings from Last 3 Encounters:  07/08/19 207 lb (93.9 kg)  12/11/18 220 lb (99.8 kg)  10/22/18 220 lb (99.8 kg)    BP 120/80    Pulse 72    Ht 5\' 7"  (1.702 m)    Wt 207 lb (93.9 kg)    BMI 32.42 kg/m    Assessment and Plan:  1. H. pylori infection Patient with history of H. pylori we will continue omeprazole 20 mg once a day. - omeprazole (PRILOSEC) 20 MG capsule; Take 1 capsule (20 mg total) by mouth daily.  Dispense: 30 capsule; Refill: 5  2. Hypothyroidism, unspecified type Patient with history of hypothyroidism we will check TSH and adjust levothyroxine accordingly.  Continue Synthroid 50 mcg 1 a day. - TSH - levothyroxine (SYNTHROID) 50 MCG tablet; Take 1 tablet (50 mcg total) by mouth daily.  Dispense: 30 tablet; Refill: 5  3. 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  Controlled.  Continue sertraline 50 mg once a day.  PHQ today was noted to be 0 - sertraline (ZOLOFT) 50 MG tablet; Take 1 tablet (50 mg total) by mouth daily.  Dispense: 30 tablet; Refill: 5  4. Panic disorder Gad 7 score was noted  to be 0 however patient was given her 30 pills to last year of alprazolam to be used for panic attacks and in fact was given a refill given the current COVID situation. - ALPRAZolam (XANAX) 0.25 MG tablet; Take 1 tablet (0.25 mg total) by mouth as needed.  Dispense: 30 tablet; Refill: 1  5. History of anemia Patient with history of anemia we will check a CBC with platelet. - CBC w/Diff/Platelet  6. Familial hypercholesterolemia Patient has elevated readings of cholesterol which is likely familial will check lipid panel. - Lipid Panel With LDL/HDL Ratio  7. History of UTI Patient has history of UTIs and will check dipstick.  On review there was no evidence of infection and patient was relieved. - POCT urinalysis dipstick

## 2019-07-09 LAB — CBC WITH DIFFERENTIAL/PLATELET
Basophils Absolute: 0 10*3/uL (ref 0.0–0.2)
Basos: 1 %
EOS (ABSOLUTE): 0.2 10*3/uL (ref 0.0–0.4)
Eos: 3 %
Hematocrit: 44 % (ref 34.0–46.6)
Hemoglobin: 15 g/dL (ref 11.1–15.9)
Immature Grans (Abs): 0 10*3/uL (ref 0.0–0.1)
Immature Granulocytes: 0 %
Lymphocytes Absolute: 1.3 10*3/uL (ref 0.7–3.1)
Lymphs: 25 %
MCH: 33.5 pg — ABNORMAL HIGH (ref 26.6–33.0)
MCHC: 34.1 g/dL (ref 31.5–35.7)
MCV: 98 fL — ABNORMAL HIGH (ref 79–97)
Monocytes Absolute: 0.5 10*3/uL (ref 0.1–0.9)
Monocytes: 10 %
Neutrophils Absolute: 3.2 10*3/uL (ref 1.4–7.0)
Neutrophils: 61 %
Platelets: 160 10*3/uL (ref 150–450)
RBC: 4.48 x10E6/uL (ref 3.77–5.28)
RDW: 11.8 % (ref 11.7–15.4)
WBC: 5.2 10*3/uL (ref 3.4–10.8)

## 2019-07-09 LAB — LIPID PANEL WITH LDL/HDL RATIO
Cholesterol, Total: 203 mg/dL — ABNORMAL HIGH (ref 100–199)
HDL: 55 mg/dL (ref >39)
LDL Calculated: 119 mg/dL — ABNORMAL HIGH (ref 0–99)
LDl/HDL Ratio: 2.2 ratio (ref 0.0–3.2)
Triglycerides: 147 mg/dL (ref 0–149)
VLDL Cholesterol Cal: 29 mg/dL (ref 5–40)

## 2019-07-09 LAB — TSH: TSH: 2.24 u[IU]/mL (ref 0.450–4.500)

## 2019-09-12 ENCOUNTER — Telehealth: Payer: Self-pay

## 2019-09-12 NOTE — Telephone Encounter (Signed)
Pt called wanting to know if we could do anything for a kidney stone. She said she is drinking water and has "some hydrocodones left over from the last." I said we could probably call in something for nausea, but there isn't much we can do other than give it time to pass. I did advise her to go to the ER if pain gets worse for a CT scan.

## 2020-01-27 ENCOUNTER — Ambulatory Visit (INDEPENDENT_AMBULATORY_CARE_PROVIDER_SITE_OTHER): Payer: PPO

## 2020-01-27 ENCOUNTER — Telehealth: Payer: PPO

## 2020-01-27 ENCOUNTER — Ambulatory Visit
Admission: EM | Admit: 2020-01-27 | Discharge: 2020-01-27 | Disposition: A | Discharge status: Home / Self Care | Payer: PPO | Attending: Family Medicine | Admitting: Family Medicine

## 2020-01-27 ENCOUNTER — Other Ambulatory Visit: Payer: Self-pay

## 2020-01-27 DIAGNOSIS — W19XXXA Unspecified fall, initial encounter: Secondary | ICD-10-CM | POA: Diagnosis not present

## 2020-01-27 DIAGNOSIS — S8392XA Sprain of unspecified site of left knee, initial encounter: Secondary | ICD-10-CM | POA: Diagnosis not present

## 2020-01-27 DIAGNOSIS — S8992XA Unspecified injury of left lower leg, initial encounter: Secondary | ICD-10-CM | POA: Diagnosis not present

## 2020-01-27 DIAGNOSIS — M25562 Pain in left knee: Secondary | ICD-10-CM | POA: Diagnosis not present

## 2020-01-27 MED ORDER — DICLOFENAC SODIUM 1 % EX GEL: 4.0000 g | Freq: Four times a day (QID) | CUTANEOUS | 0 refills | Status: DC

## 2020-01-27 MED ORDER — TRAMADOL HCL 50 MG PO TABS: 50.0000 mg | ORAL_TABLET | Freq: Four times a day (QID) | ORAL | 0 refills | Status: DC | PRN

## 2020-01-27 MED ORDER — KETOROLAC TROMETHAMINE 60 MG/2ML IM SOLN
30.0000 mg | Freq: Once | INTRAMUSCULAR | Status: AC
  Administered 2020-01-27: 30 mg via INTRAMUSCULAR

## 2020-01-27 NOTE — Discharge Instructions (Signed)
I am hopeful that the shot we have given you today will provide with some relief today.  Ice, elevation, use of your brace/compression for support.  Tylenol regularly.  Topical antiinflammatory gel, use regularly for best results.  If no improvement or persistent pain over the next 2-4 weeks please follow up with orthopedics.

## 2020-01-27 NOTE — ED Triage Notes (Addendum)
Pt reports she fell one week ago in her kitchen and twisted her left knee. Able to bear weight but with twisting or walking, or bending, has pain. No pain at rest. Tried a knee brace, ice and Tylenol. Not supposed to take anti-inflammatories. Has been doing water aerobics since the fall

## 2020-01-27 NOTE — ED Provider Notes (Signed)
[facility]    CSN: [id number] Arrival date & time: 01/27/20  1126      History   Chief Complaint Chief Complaint  Patient presents with  . Knee Injury    HPI Brenda Campbell is a 69 y.o. female.   [facility] Brenda Campbell presents with complaints of left knee pain after a slip and fall one week ago. She does not feel she landed on the knee. Pain since, but pain was worse the following morning after injury. Pain is minimal at rest and when leg is straight, but with bending or twisting has much more pain. Has applied ice, compression and elevated the leg. Has applied menthol patch which hasn't helped. Pain has not improved over the past week and with some swelling to medial knee. No redness or warmth. No previous knee injury or surgery. History of gi bleed so unable to take nsaids.   ROS per HPI, negative if not otherwise mentioned.      Past Medical History:  Diagnosis Date  . Anxiety   . Depression   . GERD (gastroesophageal reflux disease)   . Hyperlipidemia   . Hypertension   . Thyroid disease     Patient Active Problem List   Diagnosis Date Noted  . Gastrointestinal hemorrhage with melena 06/28/2017  . Blood in stool   . Acute GI bleeding   . Benign neoplasm of transverse colon   . GIB (gastrointestinal bleeding) 03/15/2017  . Panic disorder 03/13/2017  . 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 Mixed hyperlipidemia 12/23/2014  . Paroxysmal supraventricular tachycardia ([place]) 12/23/2014  . 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 Gastroesophageal reflux disease 12/22/2014  . Hypothyroidism 12/22/2014  . Obesity 12/22/2014  . Tachycardia 12/22/2014  . Malaise 12/22/2014    Past Surgical History:  Procedure Laterality Date  . CESAREAN SECTION     x 1  . COLONOSCOPY  10/12/2014   cleared for 3 years  . COLONOSCOPY N/A 03/16/2017   Procedure: COLONOSCOPY;  Surgeon: Lucilla Lame, MD;   Location: [facility] ENDOSCOPY;  Service: Endoscopy;  Laterality: N/A;  . COLONOSCOPY WITH PROPOFOL N/A 12/01/2017   Procedure: COLONOSCOPY WITH PROPOFOL;  Surgeon: [name], MD;  Location: [facility] ENDOSCOPY;  Service: Gastroenterology;  Laterality: N/A;  . ESOPHAGOGASTRODUODENOSCOPY (EGD) WITH PROPOFOL N/A 03/15/2017   Procedure: ESOPHAGOGASTRODUODENOSCOPY (EGD) WITH PROPOFOL;  Surgeon: [name], MD;  Location: [facility] ENDOSCOPY;  Service: Endoscopy;  Laterality: N/A;  . ESOPHAGOGASTRODUODENOSCOPY (EGD) WITH PROPOFOL N/A 03/30/2017   Procedure: ESOPHAGOGASTRODUODENOSCOPY (EGD) WITH PROPOFOL;  Surgeon: [name], MD;  Location: [facility] ENDOSCOPY;  Service: Endoscopy;  Laterality: N/A;  Endoscopic Capsule Placement  . ESOPHAGOGASTRODUODENOSCOPY (EGD) WITH PROPOFOL N/A 12/01/2017   Procedure: ESOPHAGOGASTRODUODENOSCOPY (EGD) WITH PROPOFOL;  Surgeon: [name], MD;  Location: [facility] ENDOSCOPY;  Service: Gastroenterology;  Laterality: N/A;  . GALLBLADDER SURGERY    . GIVENS CAPSULE STUDY N/A 03/17/2017   Procedure: GIVENS CAPSULE STUDY;  Surgeon: Lucilla Lame, MD;  Location: [facility] ENDOSCOPY;  Service: Endoscopy;  Laterality: N/A;  . GIVENS CAPSULE STUDY N/A 12/01/2017   Procedure: GIVENS CAPSULE STUDY, 12 HOUR;  Surgeon: [name], MD;  Location[facility] ENDOSCOPY;  Service: Gastroenterology;  Laterality: N/A;  . 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 TIBIA FRACTURE SURGERY Right   . WRIST SURGERY Right    arthritis    OB History   No obstetric history on file.      Home Medications    Prior to  Admission medications   Medication Sig Start Date End Date Taking? Authorizing Provider  albuterol (PROVENTIL HFA;VENTOLIN HFA) 108 (90 Base) MCG/ACT inhaler Inhale 1-2 puffs into the lungs every 6 (six) hours as needed for wheezing or shortness of breath. Use with spacer 12/11/18   Lorin Picket, PA-C  ALPRAZolam Duanne Moron) 0.25 MG tablet Take 1 tablet (0.25 mg total) by mouth as needed. 07/08/19   Juline Patch, MD  aspirin EC 81 MG tablet Take 81 mg by mouth daily.    [provider]  diclofenac Sodium (VOLTAREN) 1 % GEL Apply 4 g topically 4 (four) times daily. 01/27/20   Zigmund Gottron, NP  levothyroxine (SYNTHROID) 50 MCG tablet Take 1 tablet (50 mcg total) by mouth daily. 07/08/19   Juline Patch, MD  omeprazole (PRILOSEC) 20 MG capsule Take 1 capsule (20 mg total) by mouth daily. 07/08/19 08/07/19  Juline Patch, MD  sertraline (ZOLOFT) 50 MG tablet Take 1 tablet (50 mg total) by mouth daily. 07/08/19   Juline Patch, MD  traMADol (ULTRAM) 50 MG tablet Take 1 tablet (50 mg total) by mouth every 6 (six) hours as needed. 01/27/20   Zigmund Gottron, NP    Family History Family History  Problem Relation Age of Onset  . Diabetes Mother   . Heart disease Mother     Social History Social History   Tobacco Use  . Smoking status: Former Research scientist (life sciences)  . Smokeless tobacco: Never Used  Substance Use Topics  . Alcohol use: Yes    Alcohol/week: 9.0 standard drinks    Types: 4 Cans of beer, 5 Shots of liquor per week  . Drug use: No     Allergies   Patient has no known allergies.   Review of Systems Review of Systems   Physical Exam Triage Vital Signs ED Triage Vitals  Enc Vitals Group     BP --      Pulse Rate 01/27/20 1215 60     Resp 01/27/20 1215 18     Temp 01/27/20 1215 98.9 F (37.2 C)     Temp Source 01/27/20 1215 Oral     SpO2 01/27/20 1215 100 %     Weight 01/27/20 1215 215 lb (97.5 kg)     Height 01/27/20 1215 5\' 7"  (1.702 m)     Head Circumference --      Peak Flow --      Pain Score 01/27/20 1214 0     Pain Loc --      Pain Edu? --      Excl. in [place]? --    No data found.  Updated Vital Signs Pulse 60   Temp 98.9 F (37.2 C) (Oral)   Resp 18   Ht 5\' 7"  (1.702 m)   Wt 215 lb (97.5 kg)   SpO2 100%   BMI 33.67 kg/m   Visual Acuity Right Eye Distance:   Left Eye Distance:   Bilateral Distance:    Right Eye Near:   Left Eye Near:      Bilateral Near:     Physical Exam Constitutional:      General: She is not in acute distress.    Appearance: She is well-developed.  Cardiovascular:     Rate and Rhythm: Normal rate.  Pulmonary:     Effort: Pulmonary effort is normal.  Musculoskeletal:     Left knee: Swelling present. No erythema, ecchymosis, lacerations or bony tenderness. Normal range of motion. Tenderness present  over the medial joint line and MCL. No patellar tendon tenderness. No MCL laxity or ACL laxity.    Comments: No obvious laxity or pain with tension to medial knee; pain on palpation to left medial knee with mild swelling; no significant pain with flexion or extension at rest; ambulatory   Skin:    General: Skin is warm and dry.  Neurological:     Mental Status: She is alert and oriented to person, place, and time.      UC Treatments / Results  Labs (all labs ordered are listed, but only abnormal results are displayed) Labs Reviewed - No data to display  EKG   Radiology DG Knee Complete 4 Views Left  Result Date: 01/27/2020 CLINICAL DATA:  Twisting injury 1 week ago. EXAM: LEFT KNEE - COMPLETE 4+ VIEW COMPARISON:  None. FINDINGS: Mild medial compartment joint space narrowing and degenerative changes with early spurring. No acute fracture is identified. No osteochondral lesion. No definite joint effusion. IMPRESSION: Mild medial compartment degenerative changes but no acute bony findings. Electronically Signed   By: Marijo Sanes M.D.   On: 01/27/2020 12:33    Procedures Procedures (including critical care time)  Medications Ordered in UC Medications  ketorolac (TORADOL) injection 30 mg (30 mg Intramuscular Given 01/27/20 1253)    Initial Impression / Assessment and Plan / UC Course  I have reviewed the triage vital signs and the nursing notes.  Pertinent labs & imaging results that were available during my care of the patient were reviewed by me and considered in my medical decision making  (see chart for details).     Knee sprain, mcl likely. toradol im provided here today to help with pain, topical diclofenac as well as tramadol for breakthrough pain. Continue with tylenol. Ice, elevation, compression. Follow up with ortho if no improvement as may need mri in the future. Patient verbalized understanding and agreeable to plan.  Ambulatory out of clinic without difficulty.    Final Clinical Impressions(s) / UC Diagnoses   Final diagnoses:  Sprain of left knee, unspecified ligament, initial encounter     Discharge Instructions     I am hopeful that the shot we have given you today will provide with some relief today.  Ice, elevation, use of your brace/compression for support.  Tylenol regularly.  Topical antiinflammatory gel, use regularly for best results.  If no improvement or persistent pain over the next 2-4 weeks please follow up with orthopedics.    ED Prescriptions    Medication Sig Dispense Auth. Provider   diclofenac Sodium (VOLTAREN) 1 % GEL Apply 4 g topically 4 (four) times daily. 350 g Augusto Gamble B, NP   traMADol (ULTRAM) 50 MG tablet Take 1 tablet (50 mg total) by mouth every 6 (six) hours as needed. 15 tablet Augusto Gamble B, NP     I have reviewed the PDMP during this encounter.   Zigmund Gottron, NP 01/27/20 1325

## 2020-01-31 ENCOUNTER — Other Ambulatory Visit: Payer: Self-pay | Admitting: Family Medicine

## 2020-01-31 ENCOUNTER — Other Ambulatory Visit: Payer: Self-pay

## 2020-01-31 DIAGNOSIS — F330 Major depressive disorder, recurrent, mild: Secondary | ICD-10-CM

## 2020-01-31 DIAGNOSIS — E039 Hypothyroidism, unspecified: Secondary | ICD-10-CM

## 2020-01-31 MED ORDER — LEVOTHYROXINE SODIUM 50 MCG PO TABS: 50.0000 ug | ORAL_TABLET | Freq: Every day | ORAL | 0 refills | Status: DC

## 2020-01-31 MED ORDER — SERTRALINE HCL 50 MG PO TABS: 50.0000 mg | ORAL_TABLET | Freq: Every day | ORAL | 0 refills | Status: DC

## 2020-02-14 ENCOUNTER — Encounter: Payer: Self-pay | Admitting: Family Medicine

## 2020-02-14 ENCOUNTER — Ambulatory Visit (INDEPENDENT_AMBULATORY_CARE_PROVIDER_SITE_OTHER): Payer: PPO | Admitting: Family Medicine

## 2020-02-14 ENCOUNTER — Other Ambulatory Visit: Payer: Self-pay

## 2020-02-14 VITALS — BP 118/58 | HR 64 | Ht 67.0 in | Wt 217.0 lb

## 2020-02-14 DIAGNOSIS — L509 Urticaria, unspecified: Secondary | ICD-10-CM

## 2020-02-14 DIAGNOSIS — E039 Hypothyroidism, unspecified: Secondary | ICD-10-CM

## 2020-02-14 DIAGNOSIS — S838X2D Sprain of other specified parts of left knee, subsequent encounter: Secondary | ICD-10-CM | POA: Diagnosis not present

## 2020-02-14 DIAGNOSIS — E782 Mixed hyperlipidemia: Secondary | ICD-10-CM

## 2020-02-14 DIAGNOSIS — Z8719 Personal history of other diseases of the digestive system: Secondary | ICD-10-CM

## 2020-02-14 DIAGNOSIS — F330 Major depressive disorder, recurrent, mild: Secondary | ICD-10-CM

## 2020-02-14 DIAGNOSIS — A048 Other specified bacterial intestinal infections: Secondary | ICD-10-CM

## 2020-02-14 DIAGNOSIS — R69 Illness, unspecified: Secondary | ICD-10-CM

## 2020-02-14 DIAGNOSIS — F410 Panic disorder [episodic paroxysmal anxiety] without agoraphobia: Secondary | ICD-10-CM | POA: Diagnosis not present

## 2020-02-14 MED ORDER — SERTRALINE HCL 50 MG PO TABS: 50.0000 mg | ORAL_TABLET | Freq: Every day | ORAL | 1 refills | Status: DC

## 2020-02-14 MED ORDER — OMEPRAZOLE 20 MG PO CPDR: 20.0000 mg | DELAYED_RELEASE_CAPSULE | Freq: Every day | ORAL | 1 refills | Status: DC

## 2020-02-14 MED ORDER — LEVOTHYROXINE SODIUM 50 MCG PO TABS: 50.0000 ug | ORAL_TABLET | Freq: Every day | ORAL | 1 refills | Status: DC

## 2020-02-14 MED ORDER — ALPRAZOLAM 0.25 MG PO TABS: 0.2500 mg | ORAL_TABLET | ORAL | 1 refills | Status: DC | PRN

## 2020-02-14 NOTE — Progress Notes (Signed)
Date:  02/14/2020   Name:  Brenda Campbell   DOB:  05/14/1951   MRN:  [id number]   Chief Complaint: Depression, Gastroesophageal Reflux, Hypothyroidism, Anxiety, Knee Pain (had a fall x 4 weeks ago- was seen in urgent care- needs referral to ortho), and Rash (streaky rash across L) arm -itches)  Depression      The patient presents with depression.  This is a chronic problem.  The current episode started more than 1 year ago (4 weeks).   The onset quality is gradual.   The problem occurs intermittently.  The problem has been gradually improving since onset.  Associated symptoms include no decreased concentration, no fatigue, no helplessness, no hopelessness, does not have insomnia, not irritable, no restlessness, no decreased interest, no appetite change, no body aches, no myalgias, no headaches, no indigestion, not sad and no suicidal ideas.  Past treatments include SSRIs - Selective serotonin reuptake inhibitors.  Compliance with treatment is good.  Previous treatment provided moderate relief.  Past medical history includes thyroid problem, anxiety and depression.   Gastroesophageal Reflux She reports no abdominal pain, no belching, no chest pain, no choking, no coughing, no dysphagia, no early satiety, no globus sensation, no heartburn, no hoarse voice, no nausea, no sore throat, no stridor, no tooth decay, no water brash or no wheezing. This is a chronic problem. The current episode started more than 1 year ago. The problem has been waxing and waning. The symptoms are aggravated by certain foods. Pertinent negatives include no anemia, fatigue, melena, muscle weakness, orthopnea or weight loss. She has tried a PPI for the symptoms. The treatment provided moderate relief.  Anxiety Presents for follow-up visit. Symptoms include excessive worry and nervous/anxious behavior. Patient reports no chest pain, confusion, decreased concentration, dizziness, insomnia, irritability, nausea, restlessness,  shortness of breath or suicidal ideas. The severity of symptoms is mild.   Her past medical history is significant for depression.  Knee Pain  The incident occurred more than 1 week ago (4 weeks). The incident occurred at home. The injury mechanism was a fall and a twisting injury. The pain is at a severity of 6/10. The pain is moderate. Pertinent negatives include no inability to bear weight, loss of motion, loss of sensation, muscle weakness, numbness or tingling. The symptoms are aggravated by movement. She has tried acetaminophen (tramadol) for the symptoms. The treatment provided no relief.  Rash This is a new problem. The current episode started more than 1 month ago. The problem is unchanged. The rash is characterized by itchiness. Pertinent negatives include no congestion, cough, diarrhea, eye pain, fatigue, fever, rhinorrhea, shortness of breath, sore throat or vomiting.  Thyroid Problem Presents for follow-up visit. Symptoms include anxiety. Patient reports no cold intolerance, constipation, diarrhea, fatigue, heat intolerance, hoarse voice, menstrual problem or weight loss.    Lab Results  Component Value Date   CREATININE 0.72 10/22/2018   BUN 13 10/22/2018   NA 141 10/22/2018   K 4.5 10/22/2018   CL 103 10/22/2018   CO2 25 10/22/2018   Lab Results  Component Value Date   CHOL 203 (H) 07/08/2019   HDL 55 07/08/2019   LDLCALC 119 (H) 07/08/2019   TRIG 147 07/08/2019   CHOLHDL 4.3 10/22/2018   Lab Results  Component Value Date   TSH 2.240 07/08/2019   No results found for: HGBA1C   Review of Systems  Constitutional: Negative.  Negative for appetite change, chills, fatigue, fever, irritability, unexpected weight change and weight  loss.  HENT: Negative for congestion, ear discharge, ear pain, hoarse voice, rhinorrhea, sinus pressure, sneezing and sore throat.   Eyes: Negative for photophobia, pain, discharge, redness and itching.  Respiratory: Negative for cough,  choking, shortness of breath, wheezing and stridor.   Cardiovascular: Negative for chest pain.  Gastrointestinal: Negative for abdominal pain, blood in stool, constipation, diarrhea, dysphagia, heartburn, melena, nausea and vomiting.  Endocrine: Negative for cold intolerance, heat intolerance, polydipsia, polyphagia and polyuria.  Genitourinary: Negative for dysuria, flank pain, frequency, hematuria, menstrual problem, pelvic pain, urgency, vaginal bleeding and vaginal discharge.  Musculoskeletal: Negative for arthralgias, back pain, myalgias and muscle weakness.  Skin: Positive for rash.  Allergic/Immunologic: Negative for environmental allergies and food allergies.  Neurological: Negative for dizziness, tingling, weakness, light-headedness, numbness and headaches.  Hematological: Negative for adenopathy. Does not bruise/bleed easily.  Psychiatric/Behavioral: Positive for depression. Negative for confusion, decreased concentration, dysphoric mood and suicidal ideas. The patient is nervous/anxious. The patient does not have insomnia.     Patient Active Problem List   Diagnosis Date Noted  . Gastrointestinal hemorrhage with melena 06/28/2017  . Blood in stool   . Acute GI bleeding   . Benign neoplasm of transverse colon   . GIB (gastrointestinal bleeding) 03/15/2017  . Panic disorder 03/13/2017  . Mild episode of recurrent major depressive disorder ([place]) 03/13/2017  . Mixed hyperlipidemia 12/23/2014  . Paroxysmal supraventricular tachycardia ([place]) 12/23/2014  . 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 Gastroesophageal reflux disease 12/22/2014  . Hypothyroidism 12/22/2014  . Obesity 12/22/2014  . Tachycardia 12/22/2014  . Malaise 12/22/2014    No Known Allergies  Past Surgical History:  Procedure Laterality Date  . CESAREAN SECTION     x 1  . COLONOSCOPY  10/12/2014   cleared for 3 years  . COLONOSCOPY N/A 03/16/2017   Procedure: COLONOSCOPY;  Surgeon:  Lucilla Lame, MD;  Location: [facility] ENDOSCOPY;  Service: Endoscopy;  Laterality: N/A;  . COLONOSCOPY WITH PROPOFOL N/A 12/01/2017   Procedure: COLONOSCOPY WITH PROPOFOL;  Surgeon: [name], MD;  Location: [facility] ENDOSCOPY;  Service: Gastroenterology;  Laterality: N/A;  . ESOPHAGOGASTRODUODENOSCOPY (EGD) WITH PROPOFOL N/A 03/15/2017   Procedure: ESOPHAGOGASTRODUODENOSCOPY (EGD) WITH PROPOFOL;  Surgeon: [name], MD;  Location: [facility] ENDOSCOPY;  Service: Endoscopy;  Laterality: N/A;  . ESOPHAGOGASTRODUODENOSCOPY (EGD) WITH PROPOFOL N/A 03/30/2017   Procedure: ESOPHAGOGASTRODUODENOSCOPY (EGD) WITH PROPOFOL;  Surgeon: [name], MD;  Location: [facility] ENDOSCOPY;  Service: Endoscopy;  Laterality: N/A;  Endoscopic Capsule Placement  . ESOPHAGOGASTRODUODENOSCOPY (EGD) WITH PROPOFOL N/A 12/01/2017   Procedure: ESOPHAGOGASTRODUODENOSCOPY (EGD) WITH PROPOFOL;  Surgeon: [name], MD;  Location: [facility] ENDOSCOPY;  Service: Gastroenterology;  Laterality: N/A;  . GALLBLADDER SURGERY    . GIVENS CAPSULE STUDY N/A 03/17/2017   Procedure: GIVENS CAPSULE STUDY;  Surgeon: Lucilla Lame, MD;  Location: [facility] ENDOSCOPY;  Service: Endoscopy;  Laterality: N/A;  . GIVENS CAPSULE STUDY N/A 12/01/2017   Procedure: GIVENS CAPSULE STUDY, 12 HOUR;  Surgeon: [name], MD;  Location: [facility] ENDOSCOPY;  Service: Gastroenterology;  Laterality: N/A;  . 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 TIBIA FRACTURE SURGERY Right   . WRIST SURGERY Right    arthritis    Social History   Tobacco Use  . Smoking status: Former Research scientist (life sciences)  . Smokeless tobacco: Never Used  Substance Use Topics  . Alcohol use: Yes    Alcohol/week: 9.0 standard drinks    Types: 4 Cans of beer, 5 Shots of liquor per week  . Drug use:  No     Medication list has been reviewed and updated.  Current Meds  Medication Sig  . ALPRAZolam (XANAX) 0.25 MG tablet Take 1 tablet (0.25 mg total) by mouth as needed.  Marland Kitchen aspirin EC 81 MG tablet Take 81 mg by mouth daily.    . diclofenac Sodium (VOLTAREN) 1 % GEL Apply 4 g topically 4 (four) times daily.  Marland Kitchen levothyroxine (SYNTHROID) 50 MCG tablet Take 1 tablet (50 mcg total) by mouth daily.  Marland Kitchen omeprazole (PRILOSEC) 20 MG capsule Take 1 capsule (20 mg total) by mouth daily.  . sertraline (ZOLOFT) 50 MG tablet Take 1 tablet (50 mg total) by mouth daily.  . traMADol (ULTRAM) 50 MG tablet Take 1 tablet (50 mg total) by mouth every 6 (six) hours as needed.    PHQ 2/9 Scores 02/14/2020 07/08/2019 10/22/2018 03/13/2017  PHQ - 2 Score 0 0 1 0  PHQ- 9 Score 0 0 3 -    BP Readings from Last 3 Encounters:  02/14/20 (!) 118/58  07/08/19 120/80  12/11/18 134/76    Physical Exam Vitals and nursing note reviewed.  Constitutional:      General: She is not irritable.    Appearance: She is well-developed.  HENT:     Head: Normocephalic.     Right Ear: Tympanic membrane, ear canal and external ear normal.     Left Ear: Tympanic membrane, ear canal and external ear normal.     Nose: Nose normal.  Eyes:     General: Lids are everted, no foreign bodies appreciated. No scleral icterus.       Left eye: No foreign body or hordeolum.     Conjunctiva/sclera: Conjunctivae normal.     Right eye: Right conjunctiva is not injected.     Left eye: Left conjunctiva is not injected.     Pupils: Pupils are equal, round, and reactive to light.  Neck:     Thyroid: No thyromegaly.     Vascular: No JVD.     Trachea: No tracheal deviation.  Cardiovascular:     Rate and Rhythm: Normal rate and regular rhythm.     Heart sounds: Normal heart sounds. No murmur. No friction rub. No gallop.   Pulmonary:     Effort: Pulmonary effort is normal. No respiratory distress.     Breath sounds: Normal breath sounds. No wheezing, rhonchi or rales.  Abdominal:     General: Bowel sounds are normal.     Palpations: Abdomen is soft. There is no mass.     Tenderness: There is no abdominal tenderness. There is no guarding or rebound.  Musculoskeletal:         General: Normal range of motion.     Cervical back: Normal range of motion and neck supple.     Left knee: Effusion present. Tenderness present over the medial joint line.  Lymphadenopathy:     Cervical: No cervical adenopathy.  Skin:    General: Skin is warm.     Findings: No rash.  Neurological:     Mental Status: She is alert and oriented to person, place, and time.     Cranial Nerves: No cranial nerve deficit.     Deep Tendon Reflexes: Reflexes normal.  Psychiatric:        Mood and Affect: Mood is not anxious or depressed.     Wt Readings from Last 3 Encounters:  02/14/20 217 lb (98.4 kg)  01/27/20 215 lb (97.5 kg)  07/08/19 207 lb (93.9 kg)  BP (!) 118/58   Pulse 64   Ht 5\' 7"  (1.702 m)   Wt 217 lb (98.4 kg)   BMI 33.99 kg/m   Assessment and Plan:  1. Panic disorder Chronic.  Controlled.  Stable.  Gad score 0.  Patient has intermittent episodes of panic disorder which are immediately resolved on alprazolam 0.25 mg as needed.  We will provide another 30 tablets for as needed basis. - ALPRAZolam (XANAX) 0.25 MG tablet; Take 1 tablet (0.25 mg total) by mouth as needed.  Dispense: 30 tablet; Refill: 1  2. Hypothyroidism, unspecified type Chronic.  Controlled.  Stable.  Continue levothyroxine 50 mcg 1 a day.  Will check TSH. - levothyroxine (SYNTHROID) 50 MCG tablet; Take 1 tablet (50 mcg total) by mouth daily.  Dispense: 90 tablet; Refill: 1 - TSH  3. H. pylori infection Chronic.  Controlled.  Stable.  Continue omeprazole 20 mg once a day. - omeprazole (PRILOSEC) 20 MG capsule; Take 1 capsule (20 mg total) by mouth daily.  Dispense: 90 capsule; Refill: 1  4. 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  Controlled.  Stable.  PHQ was noted to be 0.  We will continue sertraline 50 mg once a day. - sertraline (ZOLOFT) 50 MG tablet; Take 1 tablet (50 mg total) by mouth daily.  Dispense: 90 tablet; Refill: 1  5. Urticaria Acute.  Episodic.   Patient has dermatographia with pruritus noted.  This is most likely secondary to allergy related concern.  Patient can instructed to take Zyrtec in the morning and Benadryl at night  6. Meniscal injury, left, subsequent encounter Status post recent fall in the kitchen patient sustained an injury of a twisting nature to her left knee.  Patient was evaluated in urgent care about a month ago and this is not proceeded to improve.  Patient has tenderness in the medial joint line with some puffiness/effusion consistent with possible meniscal injury.  We will refer her to orthopedics for evaluation.  Patient notes that it is not controlled on tramadol and unfortunately she has resumed them occasional [employer] powder. - Ambulatory referral to Orthopedic Surgery  7. Mixed hyperlipidemia Chronic.  Controlled.  Stable.  We will check lipid panel in the meantime patient will continue control with dietary measures. - Lipid Panel With LDL/HDL Ratio  8. History of GI bleed Patient does have a history of GI bleed presumably from [employer] powders.  We will check a CBC to see if this has stabilized. - CBC with Differential/Platelet  9. Taking medication for chronic disease Patient taking medications which may affect GFR and we will check a renal function panel for documentation. - Renal Function Panel

## 2020-02-15 LAB — RENAL FUNCTION PANEL
Albumin: 4.2 g/dL (ref 3.8–4.8)
BUN/Creatinine Ratio: 16 (ref 12–28)
BUN: 10 mg/dL (ref 8–27)
CO2: 24 mmol/L (ref 20–29)
Calcium: 8.8 mg/dL (ref 8.7–10.3)
Chloride: 107 mmol/L — ABNORMAL HIGH (ref 96–106)
Creatinine, Ser: 0.63 mg/dL (ref 0.57–1.00)
GFR calc Af Amer: 106 mL/min/{1.73_m2} (ref >59)
GFR calc non Af Amer: 92 mL/min/{1.73_m2} (ref >59)
Glucose: 98 mg/dL (ref 65–99)
Phosphorus: 3.7 mg/dL (ref 3.0–4.3)
Potassium: 4.2 mmol/L (ref 3.5–5.2)
Sodium: 143 mmol/L (ref 134–144)

## 2020-02-15 LAB — CBC WITH DIFFERENTIAL/PLATELET
Basophils Absolute: 0 10*3/uL (ref 0.0–0.2)
Basos: 1 %
EOS (ABSOLUTE): 0.2 10*3/uL (ref 0.0–0.4)
Eos: 4 %
Hematocrit: 42.4 % (ref 34.0–46.6)
Hemoglobin: 14.8 g/dL (ref 11.1–15.9)
Immature Grans (Abs): 0 10*3/uL (ref 0.0–0.1)
Immature Granulocytes: 0 %
Lymphocytes Absolute: 1.6 10*3/uL (ref 0.7–3.1)
Lymphs: 28 %
MCH: 34.1 pg — ABNORMAL HIGH (ref 26.6–33.0)
MCHC: 34.9 g/dL (ref 31.5–35.7)
MCV: 98 fL — ABNORMAL HIGH (ref 79–97)
Monocytes Absolute: 0.6 10*3/uL (ref 0.1–0.9)
Monocytes: 10 %
Neutrophils Absolute: 3.3 10*3/uL (ref 1.4–7.0)
Neutrophils: 57 %
Platelets: 248 10*3/uL (ref 150–450)
RBC: 4.34 x10E6/uL (ref 3.77–5.28)
RDW: 12.4 % (ref 11.7–15.4)
WBC: 5.7 10*3/uL (ref 3.4–10.8)

## 2020-02-15 LAB — LIPID PANEL WITH LDL/HDL RATIO
Cholesterol, Total: 201 mg/dL — ABNORMAL HIGH (ref 100–199)
HDL: 56 mg/dL (ref >39)
LDL Chol Calc (NIH): 121 mg/dL — ABNORMAL HIGH (ref 0–99)
LDL/HDL Ratio: 2.2 ratio (ref 0.0–3.2)
Triglycerides: 138 mg/dL (ref 0–149)
VLDL Cholesterol Cal: 24 mg/dL (ref 5–40)

## 2020-02-15 LAB — TSH: TSH: 2.41 u[IU]/mL (ref 0.450–4.500)

## 2020-02-19 ENCOUNTER — Other Ambulatory Visit: Payer: Self-pay | Admitting: Orthopedic Surgery

## 2020-02-19 DIAGNOSIS — M2392 Unspecified internal derangement of left knee: Secondary | ICD-10-CM | POA: Diagnosis not present

## 2020-02-19 DIAGNOSIS — M1712 Unilateral primary osteoarthritis, left knee: Secondary | ICD-10-CM

## 2020-02-19 DIAGNOSIS — M25362 Other instability, left knee: Secondary | ICD-10-CM | POA: Diagnosis not present

## 2020-02-19 DIAGNOSIS — E669 Obesity, unspecified: Secondary | ICD-10-CM | POA: Diagnosis not present

## 2020-02-19 DIAGNOSIS — S8392XA Sprain of unspecified site of left knee, initial encounter: Secondary | ICD-10-CM | POA: Diagnosis not present

## 2020-02-19 DIAGNOSIS — W010XXA Fall on same level from slipping, tripping and stumbling without subsequent striking against object, initial encounter: Secondary | ICD-10-CM | POA: Diagnosis not present

## 2020-03-02 ENCOUNTER — Ambulatory Visit
Admission: RE | Admit: 2020-03-02 | Discharge: 2020-03-02 | Disposition: A | Discharge status: Home / Self Care | Payer: PPO | Source: Ambulatory Visit | Attending: Orthopedic Surgery | Admitting: Orthopedic Surgery

## 2020-03-02 ENCOUNTER — Other Ambulatory Visit: Payer: Self-pay

## 2020-03-02 DIAGNOSIS — M1712 Unilateral primary osteoarthritis, left knee: Secondary | ICD-10-CM | POA: Diagnosis not present

## 2020-03-02 DIAGNOSIS — M25362 Other instability, left knee: Secondary | ICD-10-CM | POA: Diagnosis not present

## 2020-03-02 DIAGNOSIS — M2392 Unspecified internal derangement of left knee: Secondary | ICD-10-CM | POA: Diagnosis not present

## 2020-03-02 DIAGNOSIS — S8392XA Sprain of unspecified site of left knee, initial encounter: Secondary | ICD-10-CM | POA: Insufficient documentation

## 2020-03-02 DIAGNOSIS — S83412A Sprain of medial collateral ligament of left knee, initial encounter: Secondary | ICD-10-CM | POA: Diagnosis not present

## 2020-04-01 DIAGNOSIS — M6281 Muscle weakness (generalized): Secondary | ICD-10-CM | POA: Diagnosis not present

## 2020-04-01 DIAGNOSIS — M25562 Pain in left knee: Secondary | ICD-10-CM | POA: Diagnosis not present

## 2020-04-14 DIAGNOSIS — M6281 Muscle weakness (generalized): Secondary | ICD-10-CM | POA: Diagnosis not present

## 2020-04-14 DIAGNOSIS — M25562 Pain in left knee: Secondary | ICD-10-CM | POA: Diagnosis not present

## 2020-08-10 ENCOUNTER — Other Ambulatory Visit: Payer: Self-pay | Admitting: Family Medicine

## 2020-08-10 DIAGNOSIS — A048 Other specified bacterial intestinal infections: Secondary | ICD-10-CM

## 2020-08-23 ENCOUNTER — Other Ambulatory Visit: Payer: Self-pay | Admitting: Family Medicine

## 2020-08-23 DIAGNOSIS — E039 Hypothyroidism, unspecified: Secondary | ICD-10-CM

## 2020-08-23 DIAGNOSIS — F330 Major depressive disorder, recurrent, mild: Secondary | ICD-10-CM

## 2020-08-23 NOTE — Telephone Encounter (Signed)
Requested Prescriptions  Pending Prescriptions Disp Refills   levothyroxine (SYNTHROID) 50 MCG tablet [Pharmacy Med Name: LEVOTHYROXINE 50 MCG TABLET] 90 tablet 1    Sig: TAKE 1 TABLET BY MOUTH EVERY DAY     Endocrinology:  Hypothyroid Agents Failed - 08/23/2020 12:50 AM      Failed - TSH needs to be rechecked within 3 months after an abnormal result. Refill until TSH is due.      Passed - TSH in normal range and within 360 days    TSH  Date Value Ref Range Status  02/14/2020 2.410 0.450 - 4.500 uIU/mL Final         Passed - Valid encounter within last 12 months    Recent Outpatient Visits          6 months ago Urticaria   [facility] Juline Patch, MD   1 year ago History of anemia   [facility] Juline Patch, MD   1 year ago Mild episode of recurrent major depressive disorder [facility])   [facility] Juline Patch, MD   2 years ago Mild episode of recurrent major depressive disorder [facility])   [facility] Juline Patch, MD   3 years ago [facility] Juline Patch, MD              sertraline (ZOLOFT) 50 MG tablet [Pharmacy Med Name: SERTRALINE HCL 50 MG TABLET] 30 tablet 0    Sig: TAKE 1 TABLET BY MOUTH EVERY DAY     Psychiatry:  Antidepressants - SSRI Failed - 08/23/2020 12:50 AM      Failed - Valid encounter within last 6 months    Recent Outpatient Visits          6 months ago Urticaria   [facility] Juline Patch, MD   1 year ago History of anemia   [facility] Juline Patch, MD   1 year ago Mild episode of recurrent major depressive disorder [facility])   [facility] Juline Patch, MD   2 years ago Mild episode of recurrent major depressive disorder [facility])   [facility] Juline Patch, MD   3 years ago [place], MD             Passed - Completed PHQ-2 or PHQ-9 in the last 360 days.      Sertraline:  One month  courtesy refill provided with reminder for patient to schedule an office visit for 6 month follow-up.

## 2020-08-26 ENCOUNTER — Telehealth: Payer: PPO | Admitting: Nurse Practitioner

## 2020-08-26 DIAGNOSIS — R399 Unspecified symptoms and signs involving the genitourinary system: Secondary | ICD-10-CM | POA: Diagnosis not present

## 2020-08-26 MED ORDER — CEPHALEXIN 500 MG PO CAPS: 500.0000 mg | ORAL_CAPSULE | Freq: Two times a day (BID) | ORAL | 0 refills | Status: DC

## 2020-08-26 NOTE — Progress Notes (Signed)

## 2020-09-18 ENCOUNTER — Other Ambulatory Visit: Payer: Self-pay | Admitting: Family Medicine

## 2020-09-18 DIAGNOSIS — F330 Major depressive disorder, recurrent, mild: Secondary | ICD-10-CM

## 2020-09-22 ENCOUNTER — Other Ambulatory Visit: Payer: Self-pay | Admitting: Family Medicine

## 2020-09-22 DIAGNOSIS — F330 Major depressive disorder, recurrent, mild: Secondary | ICD-10-CM

## 2020-09-30 ENCOUNTER — Ambulatory Visit (INDEPENDENT_AMBULATORY_CARE_PROVIDER_SITE_OTHER): Payer: PPO

## 2020-09-30 DIAGNOSIS — Z780 Asymptomatic menopausal state: Secondary | ICD-10-CM

## 2020-09-30 DIAGNOSIS — Z1211 Encounter for screening for malignant neoplasm of colon: Secondary | ICD-10-CM

## 2020-09-30 DIAGNOSIS — Z0000 Encounter for general adult medical examination without abnormal findings: Secondary | ICD-10-CM

## 2020-09-30 DIAGNOSIS — Z1231 Encounter for screening mammogram for malignant neoplasm of breast: Secondary | ICD-10-CM | POA: Diagnosis not present

## 2020-09-30 NOTE — Progress Notes (Signed)
Subjective:   Brenda Campbell is a 69 y.o. female who presents for an Initial Medicare Annual Wellness Visit.  Virtual Visit via Telephone Note  I connected with  Brenda Campbell on 09/30/20 at  3:20 PM EDT by telephone and verified that I am speaking with the correct person using two identifiers.  Medicare Annual Wellness visit completed telephonically due to Covid-19 pandemic.   Location: Patient: home Provider: [facility]   I discussed the limitations, risks, security and privacy concerns of performing an evaluation and management service by telephone and the availability of in person appointments. The patient expressed understanding and agreed to proceed.  Unable to perform video visit due to video visit attempted and failed and/or patient does not have video capability.   Some vital signs may be absent or patient reported.   [name], LPN    Review of Systems      Cardiac Risk Factors include: advanced age (>70men, >80 women)     Objective:    There were no vitals filed for this visit. There is no height or weight on file to calculate BMI.  Advanced Directives 09/30/2020 01/27/2020 12/09/2017 03/15/2017 03/15/2017  Does Patient Have a Medical Advance Directive? No No No No No  Would patient like information on creating a medical advance directive? Yes (MAU/Ambulatory/Procedural Areas - Information given) No - Patient declined - No - Patient declined No - Patient declined    Current Medications (verified) Outpatient Encounter Medications as of 09/30/2020  Medication Sig  . ALPRAZolam (XANAX) 0.25 MG tablet Take 1 tablet (0.25 mg total) by mouth as needed.  Marland Kitchen aspirin EC 81 MG tablet Take 81 mg by mouth daily.  Marland Kitchen levothyroxine (SYNTHROID) 50 MCG tablet TAKE 1 TABLET BY MOUTH EVERY DAY  . omeprazole (PRILOSEC) 20 MG capsule TAKE 1 CAPSULE BY MOUTH EVERY DAY  . sertraline (ZOLOFT) 50 MG tablet TAKE 1 TABLET BY MOUTH EVERY DAY  . traMADol (ULTRAM) 50 MG tablet  Take 1 tablet (50 mg total) by mouth every 6 (six) hours as needed.  . [DISCONTINUED] cephALEXin (KEFLEX) 500 MG capsule Take 1 capsule (500 mg total) by mouth 2 (two) times daily.  . [DISCONTINUED] diclofenac Sodium (VOLTAREN) 1 % GEL Apply 4 g topically 4 (four) times daily.   No facility-administered encounter medications on file as of 09/30/2020.    Allergies (verified) Patient has no known allergies.   History: Past Medical History:  Diagnosis Date  . Anxiety   . Depression   . GERD (gastroesophageal reflux disease)   . Hyperlipidemia   . Hypertension   . Thyroid disease    Past Surgical History:  Procedure Laterality Date  . CESAREAN SECTION     x 1  . COLONOSCOPY  10/12/2014   cleared for 3 years  . COLONOSCOPY N/A 03/16/2017   Procedure: COLONOSCOPY;  Surgeon: Lucilla Lame, MD;  Location: [facility] ENDOSCOPY;  Service: Endoscopy;  Laterality: N/A;  . COLONOSCOPY WITH PROPOFOL N/A 12/01/2017   Procedure: COLONOSCOPY WITH PROPOFOL;  Surgeon: [name], MD;  Location: [facility] ENDOSCOPY;  Service: Gastroenterology;  Laterality: N/A;  . ESOPHAGOGASTRODUODENOSCOPY (EGD) WITH PROPOFOL N/A 03/15/2017   Procedure: ESOPHAGOGASTRODUODENOSCOPY (EGD) WITH PROPOFOL;  Surgeon: [name], MD;  Location: [facility] ENDOSCOPY;  Service: Endoscopy;  Laterality: N/A;  . ESOPHAGOGASTRODUODENOSCOPY (EGD) WITH PROPOFOL N/A 03/30/2017   Procedure: ESOPHAGOGASTRODUODENOSCOPY (EGD) WITH PROPOFOL;  Surgeon: [name], MD;  Location: [facility] ENDOSCOPY;  Service: Endoscopy;  Laterality: N/A;  Endoscopic Capsule Placement  . ESOPHAGOGASTRODUODENOSCOPY (EGD) WITH PROPOFOL  N/A 12/01/2017   Procedure: ESOPHAGOGASTRODUODENOSCOPY (EGD) WITH PROPOFOL;  Surgeon: [name], MD;  Location: [facility] ENDOSCOPY;  Service: Gastroenterology;  Laterality: N/A;  . GALLBLADDER SURGERY    . GIVENS CAPSULE STUDY N/A 03/17/2017   Procedure: GIVENS CAPSULE STUDY;  Surgeon: Lucilla Lame, MD;  Location: [facility] ENDOSCOPY;  Service: Endoscopy;   Laterality: N/A;  . GIVENS CAPSULE STUDY N/A 12/01/2017   Procedure: GIVENS CAPSULE STUDY, 12 HOUR;  Surgeon: [name], MD;  Location: [facility] ENDOSCOPY;  Service: Gastroenterology;  Laterality: N/A;  . 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 TIBIA FRACTURE SURGERY Right   . WRIST SURGERY Right    arthritis   Family History  Problem Relation Age of Onset  . Diabetes Mother   . Heart disease Mother    Social History   Socioeconomic History  . Marital status: Married    Spouse name: Not on file  . Number of children: Not on file  . Years of education: Not on file  . Highest education level: Not on file  Occupational History  . Not on file  Tobacco Use  . Smoking status: Former Research scientist (life sciences)  . Smokeless tobacco: Never Used  Vaping Use  . Vaping Use: Never used  Substance and Sexual Activity  . Alcohol use: Yes    Alcohol/week: 9.0 standard drinks    Types: 4 Cans of beer, 5 Shots of liquor per week  . Drug use: No  . Sexual activity: Not Currently  Other Topics Concern  . Not on file  Social History Narrative  . Not on file   Social Determinants of Health   Financial Resource Strain: Low Risk   . Difficulty of Paying Living Expenses: Not hard at all  Food Insecurity: No Food Insecurity  . Worried About Charity fundraiser in the Last Year: Never true  . Ran Out of Food in the Last Year: Never true  Transportation Needs: No Transportation Needs  . Lack of Transportation (Medical): No  . Lack of Transportation (Non-Medical): No  Physical Activity: Insufficiently Active  . Days of Exercise per Week: 2 days  . Minutes of Exercise per Session: 60 min  Stress: No Stress Concern Present  . Feeling of Stress : Not at all  Social Connections: Moderately Integrated  . Frequency of Communication with Friends and Family: More than three times a week  . Frequency of Social Gatherings with Friends and Family: More than three times a week  . Attends Religious Services: Never  .  Active Member of Clubs or Organizations: Yes  . Attends Archivist Meetings: More than 4 times per year  . Marital Status: Married    Tobacco Counseling Counseling given: Not Answered   Clinical Intake:  Pre-visit preparation completed: Yes  Pain : No/denies pain     Nutritional Risks: None Diabetes: No  How often do you need to have someone help you when you read instructions, pamphlets, or other written materials from your doctor or pharmacy?: 1 - Never    Interpreter Needed?: No  Information entered by :: [name] LPN   Activities of Daily Living In your present state of health, do you have any difficulty performing the following activities: 09/30/2020  Hearing? N  Comment declines hearing aids  Vision? N  Difficulty concentrating or making decisions? N  Walking or climbing stairs? N  Dressing or bathing? N  Doing errands, shopping? N  Preparing Food and eating ? N  Using the  Toilet? N  In the past six months, have you accidently leaked urine? Y  Do you have problems with loss of bowel control? N  Managing your Medications? N  Managing your Finances? N  Housekeeping or managing your Housekeeping? N  Some recent data might be hidden    Patient Care Team: Juline Patch, MD as PCP - General (Family Medicine)  Indicate any recent Medical Services you may have received from other than [facility] providers in the past year (date may be approximate).     Assessment:   This is a routine wellness examination for Brenda.  Hearing/Vision screen  Hearing Screening   125Hz  250Hz  500Hz  1000Hz  2000Hz  3000Hz  4000Hz  6000Hz  8000Hz   Right ear:           Left ear:           Comments: Pt denies hearing difficulty  Vision Screening Comments: Annual vision screenings done [facility] Dr. Thomasene Ripple  Dietary issues and exercise activities discussed: Current Exercise Habits: Structured exercise class, Type of exercise: Other - see comments (water  aerobics), Time (Minutes): 60, Frequency (Times/Week): 2, Weekly Exercise (Minutes/Week): 120, Intensity: Moderate, Exercise limited by: None identified  Goals    . DIET - INCREASE WATER INTAKE     Recommend drinking 6-8 glasses of water per day       Depression Screen PHQ 2/9 Scores 09/30/2020 02/14/2020 07/08/2019 10/22/2018 03/13/2017 12/30/2015  PHQ - 2 Score 0 0 0 1 0 0  PHQ- 9 Score - 0 0 3 - -    Fall Risk Fall Risk  09/30/2020 02/14/2020 10/22/2018 03/13/2017 12/30/2015  Falls in the past year? 1 1 0 No No  Number falls in past yr: 0 0 - - -  Injury with Fall? 1 1 - - -  Risk for fall due to : History of fall(s) - - - -  Follow up Falls prevention discussed Falls evaluation completed Falls evaluation completed - -    Any stairs in or around the home? Yes  If so, are there any without handrails? No  Home free of loose throw rugs in walkways, pet beds, electrical cords, etc? Yes  Adequate lighting in your home to reduce risk of falls? Yes   ASSISTIVE DEVICES UTILIZED TO PREVENT FALLS:  Life alert? No  Use of a cane, walker or w/c? No  Grab bars in the bathroom? No  Shower chair or bench in shower? No  Elevated toilet seat or a handicapped toilet? Yes   TIMED UP AND GO:  Was the test performed? No . Telephonic visit.   Cognitive Function:     6CIT Screen 09/30/2020  What Year? 0 points  What month? 0 points  What time? 0 points  Count back from 20 0 points  Months in reverse 0 points  Repeat phrase 0 points  Total Score 0    Immunizations Immunization History  Administered Date(s) Administered  . Fluad Quad(high Dose 65+) 11/19/2019  . Influenza, High Dose Seasonal PF 10/14/2018  . Influenza-Unspecified 09/12/2015, 10/14/2018  . Moderna SARS-COVID-2 Vaccination 01/23/2020, 02/20/2020  . Pneumococcal Conjugate-13 12/30/2015, 10/14/2018  . Tdap 12/21/2013    TDAP status: Up to date   Flu vaccine status: due for 2021-2022 season  Pneumococcal vaccine: due for  PPSV23  Covid-19 vaccine status: Completed vaccines  Qualifies for Shingles Vaccine? Yes   Zostavax completed Yes  per patient  Shingrix Completed?: No.    Education has been provided regarding the importance of this vaccine.  Patient has been advised to call insurance company to determine out of pocket expense if they have not yet received this vaccine. Advised may also receive vaccine at local pharmacy or Health Dept. Verbalized acceptance and understanding.  Screening Tests Health Maintenance  Topic Date Due  . PNA vac Low Risk Adult (2 of 2 - PPSV23) 10/15/2019  . INFLUENZA VACCINE  07/12/2020  . MAMMOGRAM  02/13/2021 (Originally 02/17/2017)  . DEXA SCAN  02/13/2021 (Originally 12/25/2015)  . Hepatitis C Screening  02/13/2021 (Originally 11/17/1951)  . COLONOSCOPY  12/01/2020  . TETANUS/TDAP  12/22/2023  . COVID-19 Vaccine  Completed    Health Maintenance  Health Maintenance Due  Topic Date Due  . PNA vac Low Risk Adult (2 of 2 - PPSV23) 10/15/2019  . INFLUENZA VACCINE  07/12/2020    Colorectal cancer screening: Completed 12/01/17. Repeat every 3 years   Mammogram status: Completed 02/18/15. Repeat every year. Ordered today.  Bone Density status: Ordered today. Pt provided with contact info and advised to call to schedule appt.  Lung Cancer Screening: (Low Dose CT Chest recommended if Age 43-80 years, 30 pack-year currently smoking OR have quit w/in 15years.) does not qualify.   Additional Screening:  Hepatitis C Screening: does qualify; postponed  Vision Screening: Recommended annual ophthalmology exams for early detection of glaucoma and other disorders of the eye. Is the patient up to date with their annual eye exam?  Yes  Who is the provider or what is the name of the office in which the patient attends annual eye exams? [place] Screening: Recommended annual dental exams for proper oral hygiene  Community Resource Referral / Chronic Care  Management: CRR required this visit?  No   CCM required this visit?  No      Plan:     I have personally reviewed and noted the following in the patient's chart:   . Medical and social history . Use of alcohol, tobacco or illicit drugs  . Current medications and supplements . Functional ability and status . Nutritional status . Physical activity . Advanced directives . List of other physicians . Hospitalizations, surgeries, and ER visits in previous 12 months . Vitals . Screenings to include cognitive, depression, and falls . Referrals and appointments  In addition, I have reviewed and discussed with patient certain preventive protocols, quality metrics, and best practice recommendations. A written personalized care plan for preventive services as well as general preventive health recommendations were provided to patient.     [name], LPN   [id number]   Nurse Notes: none

## 2020-09-30 NOTE — Patient Instructions (Signed)
Brenda Campbell , Thank you for taking time to come for your Medicare Wellness Visit. I appreciate your ongoing commitment to your health goals. Please review the following plan we discussed and let me know if I can assist you in the future.   Screening recommendations/referrals: Colonoscopy: done 12/01/17. Referral sent to [facility] today for repeat screening colonoscopy. They will contact you for an appointment.  Mammogram: done 02/18/15. Please call [phone] to schedule your mammogram and bone density screening.   Recommended yearly ophthalmology/optometry visit for glaucoma screening and checkup Recommended yearly dental visit for hygiene and checkup  Vaccinations: Influenza vaccine: due Pneumococcal vaccine: done 10/14/18. Due for Pneumovax23 Tdap vaccine: done 02/27/14 Shingles vaccine: Shingrix discussed. Please contact your pharmacy for coverage information.  Covid-19: done 01/23/20 & 02/20/20  Advanced directives: Advance directive discussed with you today. I have provided a copy for you to complete at home and have notarized. Once this is complete please bring a copy in to our office so we can scan it into your chart.  Conditions/risks identified: Recommend drinking 6-8 glasses of water per day   Next appointment: Follow up in one year for your annual wellness visit    Preventive Care 65 Years and Older, Female Preventive care refers to lifestyle choices and visits with your health care provider that can promote health and wellness. What does preventive care include?  A yearly physical exam. This is also called an annual well check.  Dental exams once or twice a year.  Routine eye exams. Ask your health care provider how often you should have your eyes checked.  Personal lifestyle choices, including:  Daily care of your teeth and gums.  Regular physical activity.  Eating a healthy diet.  Avoiding tobacco and drug use.  Limiting alcohol  use.  Practicing safe sex.  Taking low-dose aspirin every day.  Taking vitamin and mineral supplements as recommended by your health care provider. What happens during an annual well check? The services and screenings done by your health care provider during your annual well check will depend on your age, overall health, lifestyle risk factors, and family history of disease. Counseling  Your health care provider may ask you questions about your:  Alcohol use.  Tobacco use.  Drug use.  Emotional well-being.  Home and relationship well-being.  Sexual activity.  Eating habits.  History of falls.  Memory and ability to understand (cognition).  Work and work Statistician.  Reproductive health. Screening  You may have the following tests or measurements:  Height, weight, and BMI.  Blood pressure.  Lipid and cholesterol levels. These may be checked every 5 years, or more frequently if you are over 72 years old.  Skin check.  Lung cancer screening. You may have this screening every year starting at age 10 if you have a 30-pack-year history of smoking and currently smoke or have quit within the past 15 years.  Fecal occult blood test (FOBT) of the stool. You may have this test every year starting at age 71.  Flexible sigmoidoscopy or colonoscopy. You may have a sigmoidoscopy every 5 years or a colonoscopy every 10 years starting at age 30.  Hepatitis C blood test.  Hepatitis B blood test.  Sexually transmitted disease (STD) testing.  Diabetes screening. This is done by checking your blood sugar (glucose) after you have not eaten for a while (fasting). You may have this done every 1-3 years.  Bone density scan. This is done to screen for osteoporosis. You may have  this done starting at age 14.  Mammogram. This may be done every 1-2 years. Talk to your health care provider about how often you should have regular mammograms. Talk with your health care provider about  your test results, treatment options, and if necessary, the need for more tests. Vaccines  Your health care provider may recommend certain vaccines, such as:  Influenza vaccine. This is recommended every year.  Tetanus, diphtheria, and acellular pertussis (Tdap, Td) vaccine. You may need a Td booster every 10 years.  Zoster vaccine. You may need this after age 79.  Pneumococcal 13-valent conjugate (PCV13) vaccine. One dose is recommended after age 68.  Pneumococcal polysaccharide (PPSV23) vaccine. One dose is recommended after age 9. Talk to your health care provider about which screenings and vaccines you need and how often you need them. This information is not intended to replace advice given to you by your health care provider. Make sure you discuss any questions you have with your health care provider. Document Released: 12/25/2015 Document Revised: 08/17/2016 Document Reviewed: 09/29/2015 Elsevier Interactive Patient Education  2017 [place] Prevention in the Home Falls can cause injuries. They can happen to people of all ages. There are many things you can do to make your home safe and to help prevent falls. What can I do on the outside of my home?  Regularly fix the edges of walkways and driveways and fix any cracks.  Remove anything that might make you trip as you walk through a door, such as a raised step or threshold.  Trim any bushes or trees on the path to your home.  Use bright outdoor lighting.  Clear any walking paths of anything that might make someone trip, such as rocks or tools.  Regularly check to see if handrails are loose or broken. Make sure that both sides of any steps have handrails.  Any raised decks and porches should have guardrails on the edges.  Have any leaves, snow, or ice cleared regularly.  Use sand or salt on walking paths during winter.  Clean up any spills in your garage right away. This includes oil or grease spills. What  can I do in the bathroom?  Use night lights.  Install grab bars by the toilet and in the tub and shower. Do not use towel bars as grab bars.  Use non-skid mats or decals in the tub or shower.  If you need to sit down in the shower, use a plastic, non-slip stool.  Keep the floor dry. Clean up any water that spills on the floor as soon as it happens.  Remove soap buildup in the tub or shower regularly.  Attach bath mats securely with double-sided non-slip rug tape.  Do not have throw rugs and other things on the floor that can make you trip. What can I do in the bedroom?  Use night lights.  Make sure that you have a light by your bed that is easy to reach.  Do not use any sheets or blankets that are too big for your bed. They should not hang down onto the floor.  Have a firm chair that has side arms. You can use this for support while you get dressed.  Do not have throw rugs and other things on the floor that can make you trip. What can I do in the kitchen?  Clean up any spills right away.  Avoid walking on wet floors.  Keep items that you use a lot in easy-to-reach  places.  If you need to reach something above you, use a strong step stool that has a grab bar.  Keep electrical cords out of the way.  Do not use floor polish or wax that makes floors slippery. If you must use wax, use non-skid floor wax.  Do not have throw rugs and other things on the floor that can make you trip. What can I do with my stairs?  Do not leave any items on the stairs.  Make sure that there are handrails on both sides of the stairs and use them. Fix handrails that are broken or loose. Make sure that handrails are as long as the stairways.  Check any carpeting to make sure that it is firmly attached to the stairs. Fix any carpet that is loose or worn.  Avoid having throw rugs at the top or bottom of the stairs. If you do have throw rugs, attach them to the floor with carpet tape.  Make sure  that you have a light switch at the top of the stairs and the bottom of the stairs. If you do not have them, ask someone to add them for you. What else can I do to help prevent falls?  Wear shoes that:  Do not have high heels.  Have rubber bottoms.  Are comfortable and fit you well.  Are closed at the toe. Do not wear sandals.  If you use a stepladder:  Make sure that it is fully opened. Do not climb a closed stepladder.  Make sure that both sides of the stepladder are locked into place.  Ask someone to hold it for you, if possible.  Clearly mark and make sure that you can see:  Any grab bars or handrails.  First and last steps.  Where the edge of each step is.  Use tools that help you move around (mobility aids) if they are needed. These include:  Canes.  Walkers.  Scooters.  Crutches.  Turn on the lights when you go into a dark area. Replace any light bulbs as soon as they burn out.  Set up your furniture so you have a clear path. Avoid moving your furniture around.  If any of your floors are uneven, fix them.  If there are any pets around you, be aware of where they are.  Review your medicines with your doctor. Some medicines can make you feel dizzy. This can increase your chance of falling. Ask your doctor what other things that you can do to help prevent falls. This information is not intended to replace advice given to you by your health care provider. Make sure you discuss any questions you have with your health care provider. Document Released: 09/24/2009 Document Revised: 05/05/2016 Document Reviewed: 01/02/2015 Elsevier Interactive Patient Education  2017 [employer].

## 2020-10-01 ENCOUNTER — Telehealth: Payer: Self-pay

## 2020-10-01 NOTE — Telephone Encounter (Signed)
Called pt with appt for Nov. 16th @ 1:20 in [place]- bone density and mammo. Gave pt the number to call and change, as she stated she cannot go that day

## 2020-10-05 ENCOUNTER — Other Ambulatory Visit: Payer: Self-pay

## 2020-10-05 ENCOUNTER — Encounter: Payer: Self-pay | Admitting: Family Medicine

## 2020-10-05 ENCOUNTER — Ambulatory Visit (INDEPENDENT_AMBULATORY_CARE_PROVIDER_SITE_OTHER): Payer: PPO | Admitting: Family Medicine

## 2020-10-05 VITALS — BP 120/80 | HR 60 | Ht 67.0 in | Wt 229.0 lb

## 2020-10-05 DIAGNOSIS — F410 Panic disorder [episodic paroxysmal anxiety] without agoraphobia: Secondary | ICD-10-CM

## 2020-10-05 DIAGNOSIS — Z23 Encounter for immunization: Secondary | ICD-10-CM

## 2020-10-05 DIAGNOSIS — R739 Hyperglycemia, unspecified: Secondary | ICD-10-CM | POA: Diagnosis not present

## 2020-10-05 DIAGNOSIS — F330 Major depressive disorder, recurrent, mild: Secondary | ICD-10-CM

## 2020-10-05 DIAGNOSIS — A048 Other specified bacterial intestinal infections: Secondary | ICD-10-CM | POA: Diagnosis not present

## 2020-10-05 DIAGNOSIS — E039 Hypothyroidism, unspecified: Secondary | ICD-10-CM

## 2020-10-05 MED ORDER — LEVOTHYROXINE SODIUM 50 MCG PO TABS: 50.0000 ug | ORAL_TABLET | Freq: Every day | ORAL | 1 refills | Status: DC

## 2020-10-05 MED ORDER — SERTRALINE HCL 50 MG PO TABS: 50.0000 mg | ORAL_TABLET | Freq: Every day | ORAL | 1 refills | Status: DC

## 2020-10-05 MED ORDER — ALPRAZOLAM 0.25 MG PO TABS: 0.2500 mg | ORAL_TABLET | ORAL | 1 refills | Status: DC | PRN

## 2020-10-05 MED ORDER — OMEPRAZOLE 20 MG PO CPDR: DELAYED_RELEASE_CAPSULE | ORAL | 1 refills | Status: DC

## 2020-10-05 NOTE — Progress Notes (Signed)
Date:  10/05/2020   Name:  Brenda Campbell   DOB:  08/05/1951   MRN:  [id number]   Chief Complaint: Hypothyroidism, Depression, Gastroesophageal Reflux, Anxiety, and Flu Vaccine  Depression        This is a chronic problem.  The current episode started more than 1 year ago.   The onset quality is gradual.   The problem occurs intermittently.  The problem has been gradually improving since onset.  Associated symptoms include fatigue.  Associated symptoms include no decreased concentration, no helplessness, no hopelessness, does not have insomnia, not irritable, no restlessness, no decreased interest, no appetite change, no body aches, no myalgias, no headaches, no indigestion, not sad and no suicidal ideas.  Past treatments include SSRIs - Selective serotonin reuptake inhibitors.  Previous treatment provided moderate relief.  Past medical history includes anxiety.   Gastroesophageal Reflux She reports no abdominal pain, no belching, no chest pain, no choking, no coughing, no dysphagia, no early satiety, no globus sensation, no heartburn, no hoarse voice, no nausea, no sore throat, no stridor, no tooth decay or no wheezing. This is a chronic problem. The current episode started more than 1 year ago. The problem has been waxing and waning. The symptoms are aggravated by certain foods. Associated symptoms include fatigue. Pertinent negatives include no anemia, melena, muscle weakness, orthopnea or weight loss. She has tried a PPI for the symptoms. The treatment provided moderate relief.  Anxiety Presents for follow-up visit. Patient reports no chest pain, decreased concentration, dizziness, excessive worry, insomnia, nausea, nervous/anxious behavior, palpitations, restlessness, shortness of breath or suicidal ideas.      Lab Results  Component Value Date   CREATININE 0.63 02/14/2020   BUN 10 02/14/2020   NA 143 02/14/2020   K 4.2 02/14/2020   CL 107 (H) 02/14/2020   CO2 24 02/14/2020     Lab Results  Component Value Date   CHOL 201 (H) 02/14/2020   HDL 56 02/14/2020   LDLCALC 121 (H) 02/14/2020   TRIG 138 02/14/2020   CHOLHDL 4.3 10/22/2018   Lab Results  Component Value Date   TSH 2.410 02/14/2020   No results found for: HGBA1C Lab Results  Component Value Date   WBC 5.7 02/14/2020   HGB 14.8 02/14/2020   HCT 42.4 02/14/2020   MCV 98 (H) 02/14/2020   PLT 248 02/14/2020   Lab Results  Component Value Date   ALT 13 (L) 03/15/2017   AST 17 03/15/2017   ALKPHOS 54 03/15/2017   BILITOT 0.5 03/15/2017     Review of Systems  Constitutional: Positive for fatigue. Negative for appetite change, chills, fever, unexpected weight change and weight loss.  HENT: Negative for congestion, ear discharge, ear pain, hoarse voice, rhinorrhea, sinus pressure, sneezing and sore throat.   Eyes: Negative for photophobia, pain, discharge, redness and itching.  Respiratory: Negative for cough, choking, shortness of breath, wheezing and stridor.   Cardiovascular: Negative for chest pain, palpitations and leg swelling.  Gastrointestinal: Negative for abdominal pain, blood in stool, constipation, diarrhea, dysphagia, heartburn, melena, nausea and vomiting.  Endocrine: Negative for cold intolerance, heat intolerance, polydipsia, polyphagia and polyuria.  Genitourinary: Negative for dysuria, flank pain, frequency, hematuria, menstrual problem, pelvic pain, urgency, vaginal bleeding and vaginal discharge.  Musculoskeletal: Negative for arthralgias, back pain, myalgias and muscle weakness.  Skin: Negative for rash.  Allergic/Immunologic: Negative for environmental allergies and food allergies.  Neurological: Negative for dizziness, weakness, light-headedness, numbness and headaches.  Hematological: Negative for adenopathy.  Does not bruise/bleed easily.  Psychiatric/Behavioral: Positive for depression. Negative for decreased concentration, dysphoric mood and suicidal ideas. The patient  is not nervous/anxious and does not have insomnia.     Patient Active Problem List   Diagnosis Date Noted  . Gastrointestinal hemorrhage with melena 06/28/2017  . Blood in stool   . Acute GI bleeding   . Benign neoplasm of transverse colon   . GIB (gastrointestinal bleeding) 03/15/2017  . Panic disorder 03/13/2017  . Mild episode of recurrent major depressive disorder ([place]) 03/13/2017  . Mixed hyperlipidemia 12/23/2014  . Paroxysmal supraventricular tachycardia ([place]) 12/23/2014  . 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 Gastroesophageal reflux disease 12/22/2014  . Hypothyroidism 12/22/2014  . Obesity 12/22/2014  . Tachycardia 12/22/2014  . Malaise 12/22/2014    No Known Allergies  Past Surgical History:  Procedure Laterality Date  . CESAREAN SECTION     x 1  . COLONOSCOPY  10/12/2014   cleared for 3 years  . COLONOSCOPY N/A 03/16/2017   Procedure: COLONOSCOPY;  Surgeon: Lucilla Lame, MD;  Location: [facility] ENDOSCOPY;  Service: Endoscopy;  Laterality: N/A;  . COLONOSCOPY WITH PROPOFOL N/A 12/01/2017   Procedure: COLONOSCOPY WITH PROPOFOL;  Surgeon: [name], MD;  Location: [facility] ENDOSCOPY;  Service: Gastroenterology;  Laterality: N/A;  . ESOPHAGOGASTRODUODENOSCOPY (EGD) WITH PROPOFOL N/A 03/15/2017   Procedure: ESOPHAGOGASTRODUODENOSCOPY (EGD) WITH PROPOFOL;  Surgeon: [name], MD;  Location: [facility] ENDOSCOPY;  Service: Endoscopy;  Laterality: N/A;  . ESOPHAGOGASTRODUODENOSCOPY (EGD) WITH PROPOFOL N/A 03/30/2017   Procedure: ESOPHAGOGASTRODUODENOSCOPY (EGD) WITH PROPOFOL;  Surgeon: [name], MD;  Location: [facility] ENDOSCOPY;  Service: Endoscopy;  Laterality: N/A;  Endoscopic Capsule Placement  . ESOPHAGOGASTRODUODENOSCOPY (EGD) WITH PROPOFOL N/A 12/01/2017   Procedure: ESOPHAGOGASTRODUODENOSCOPY (EGD) WITH PROPOFOL;  Surgeon: [name], MD;  Location: [facility] ENDOSCOPY;  Service: Gastroenterology;  Laterality: N/A;  . GALLBLADDER SURGERY    . GIVENS CAPSULE  STUDY N/A 03/17/2017   Procedure: GIVENS CAPSULE STUDY;  Surgeon: Lucilla Lame, MD;  Location: [facility] ENDOSCOPY;  Service: Endoscopy;  Laterality: N/A;  . GIVENS CAPSULE STUDY N/A 12/01/2017   Procedure: GIVENS CAPSULE STUDY, 12 HOUR;  Surgeon: [name], MD;  Location: [facility] ENDOSCOPY;  Service: Gastroenterology;  Laterality: N/A;  . 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 TIBIA FRACTURE SURGERY Right   . WRIST SURGERY Right    arthritis    Social History   Tobacco Use  . Smoking status: Former Research scientist (life sciences)  . Smokeless tobacco: Never Used  Vaping Use  . Vaping Use: Never used  Substance Use Topics  . Alcohol use: Yes    Alcohol/week: 9.0 standard drinks    Types: 4 Cans of beer, 5 Shots of liquor per week  . Drug use: No     Medication list has been reviewed and updated.  Current Meds  Medication Sig  . ALPRAZolam (XANAX) 0.25 MG tablet Take 1 tablet (0.25 mg total) by mouth as needed.  Marland Kitchen levothyroxine (SYNTHROID) 50 MCG tablet TAKE 1 TABLET BY MOUTH EVERY DAY  . omeprazole (PRILOSEC) 20 MG capsule TAKE 1 CAPSULE BY MOUTH EVERY DAY  . sertraline (ZOLOFT) 50 MG tablet TAKE 1 TABLET BY MOUTH EVERY DAY    PHQ 2/9 Scores 10/05/2020 09/30/2020 02/14/2020 07/08/2019  PHQ - 2 Score 0 0 0 0  PHQ- 9 Score 0 - 0 0    GAD 7 : Generalized Anxiety Score 10/05/2020 02/14/2020 07/08/2019  Nervous, Anxious, on Edge 0 0 0  Control/stop worrying 0 0 0  Worry too much - different things 0 0 0  Trouble relaxing 0 0 0  Restless 0 0 0  Easily annoyed or irritable 0 0 0  Afraid - awful might happen 0 0 0  Total GAD 7 Score 0 0 0    BP Readings from Last 3 Encounters:  10/05/20 120/80  02/14/20 (!) 118/58  07/08/19 120/80    Physical Exam Vitals and nursing note reviewed.  Constitutional:      General: She is not irritable.    Appearance: She is well-developed.  HENT:     Head: Normocephalic.     Right Ear: Tympanic membrane, ear canal and external ear normal.     Left Ear: Tympanic  membrane, ear canal and external ear normal.     Nose: Nose normal. No congestion or rhinorrhea.     Mouth/Throat:     Mouth: Mucous membranes are moist.  Eyes:     General: Lids are everted, no foreign bodies appreciated. No scleral icterus.       Left eye: No foreign body or hordeolum.     Conjunctiva/sclera: Conjunctivae normal.     Right eye: Right conjunctiva is not injected.     Left eye: Left conjunctiva is not injected.     Pupils: Pupils are equal, round, and reactive to light.  Neck:     Thyroid: No thyromegaly.     Vascular: No JVD.     Trachea: No tracheal deviation.  Cardiovascular:     Rate and Rhythm: Normal rate and regular rhythm.     Heart sounds: Normal heart sounds, S1 normal and S2 normal. No murmur heard.  No systolic murmur is present.  No diastolic murmur is present.  No friction rub. No gallop. No S3 or S4 sounds.   Pulmonary:     Effort: Pulmonary effort is normal. No respiratory distress.     Breath sounds: Normal breath sounds. No wheezing, rhonchi or rales.  Abdominal:     General: Bowel sounds are normal.     Palpations: Abdomen is soft. There is no mass.     Tenderness: There is no abdominal tenderness. There is no guarding or rebound.  Musculoskeletal:        General: No tenderness. Normal range of motion.     Cervical back: Normal range of motion and neck supple.     Right lower leg: No edema.     Left lower leg: No edema.  Lymphadenopathy:     Cervical: No cervical adenopathy.  Skin:    General: Skin is warm.     Findings: No rash.  Neurological:     Mental Status: She is alert and oriented to person, place, and time.     Cranial Nerves: No cranial nerve deficit.     Deep Tendon Reflexes: Reflexes normal.  Psychiatric:        Mood and Affect: Mood is not anxious or depressed.     Wt Readings from Last 3 Encounters:  10/05/20 229 lb (103.9 kg)  02/14/20 217 lb (98.4 kg)  01/27/20 215 lb (97.5 kg)    BP 120/80   Pulse 60   Ht 5'  7" (1.702 m)   Wt 229 lb (103.9 kg)   BMI 35.87 kg/m   Assessment and Plan: 1. Hypothyroidism, unspecified type Chronic.  Controlled.  Stable.  Continue levothyroxine 50 mcg daily.  Will check thyroid panel with TSH and will recheck in 6 months. - Thyroid Panel With TSH - levothyroxine (SYNTHROID) 50  MCG tablet; Take 1 tablet (50 mcg total) by mouth daily.  Dispense: 90 tablet; Refill: 1  2. 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  Controlled.  0.  PHQ is 0.  Continue sertraline 50 mg once a day. - sertraline (ZOLOFT) 50 MG tablet; Take 1 tablet (50 mg total) by mouth daily.  Dispense: 90 tablet; Refill: 1  3. H. pylori infection Chronic.  Controlled.  Stable.  Continue omeprazole 20 mg once a day. - omeprazole (PRILOSEC) 20 MG capsule; TAKE 1 CAPSULE BY MOUTH EVERY DAY  Dispense: 90 capsule; Refill: 1  4. Panic disorder Chronic.  Controlled.  Stable.  Patient had Xanax 0.25 take on an as-needed basis for panic attacks. - ALPRAZolam (XANAX) 0.25 MG tablet; Take 1 tablet (0.25 mg total) by mouth as needed.  Dispense: 30 tablet; Refill: 1  5. Need for immunization against influenza Discussed and administered. - Flu Vaccine QUAD High Dose(Fluad)  6. Hyperglycemia Patient had mild elevation of glucose on previous renal function panel and will recheck as to status of electrolytes glucose and GFR. - Renal Function Panel

## 2020-10-06 LAB — RENAL FUNCTION PANEL
Albumin: 4.2 g/dL (ref 3.8–4.8)
BUN/Creatinine Ratio: 20 (ref 12–28)
BUN: 13 mg/dL (ref 8–27)
CO2: 25 mmol/L (ref 20–29)
Calcium: 8.8 mg/dL (ref 8.7–10.3)
Chloride: 101 mmol/L (ref 96–106)
Creatinine, Ser: 0.66 mg/dL (ref 0.57–1.00)
GFR calc Af Amer: 104 mL/min/{1.73_m2} (ref >59)
GFR calc non Af Amer: 90 mL/min/{1.73_m2} (ref >59)
Glucose: 91 mg/dL (ref 65–99)
Phosphorus: 3.8 mg/dL (ref 3.0–4.3)
Potassium: 4.4 mmol/L (ref 3.5–5.2)
Sodium: 139 mmol/L (ref 134–144)

## 2020-10-06 LAB — THYROID PANEL WITH TSH
Free Thyroxine Index: 1.8 (ref 1.2–4.9)
T3 Uptake Ratio: 25 % (ref 24–39)
T4, Total: 7 ug/dL (ref 4.5–12.0)
TSH: 3.05 u[IU]/mL (ref 0.450–4.500)

## 2020-10-27 ENCOUNTER — Other Ambulatory Visit: Payer: PPO

## 2020-10-27 ENCOUNTER — Ambulatory Visit: Payer: PPO

## 2020-11-19 ENCOUNTER — Other Ambulatory Visit: Payer: Self-pay

## 2020-11-19 ENCOUNTER — Ambulatory Visit
Admission: RE | Admit: 2020-11-19 | Discharge: 2020-11-19 | Disposition: A | Discharge status: Home / Self Care | Payer: PPO | Source: Ambulatory Visit | Attending: Family Medicine | Admitting: Family Medicine

## 2020-11-19 DIAGNOSIS — Z1231 Encounter for screening mammogram for malignant neoplasm of breast: Secondary | ICD-10-CM | POA: Insufficient documentation

## 2020-11-19 DIAGNOSIS — R29890 Loss of height: Secondary | ICD-10-CM | POA: Diagnosis not present

## 2020-11-19 DIAGNOSIS — Z87828 Personal history of other (healed) physical injury and trauma: Secondary | ICD-10-CM | POA: Diagnosis not present

## 2020-11-19 DIAGNOSIS — M85851 Other specified disorders of bone density and structure, right thigh: Secondary | ICD-10-CM | POA: Insufficient documentation

## 2020-11-19 DIAGNOSIS — Z13820 Encounter for screening for osteoporosis: Secondary | ICD-10-CM | POA: Insufficient documentation

## 2020-11-19 DIAGNOSIS — Z780 Asymptomatic menopausal state: Secondary | ICD-10-CM | POA: Insufficient documentation

## 2020-11-19 DIAGNOSIS — E039 Hypothyroidism, unspecified: Secondary | ICD-10-CM | POA: Diagnosis not present

## 2020-11-19 HISTORY — DX: Malignant (primary) neoplasm, unspecified: C80.1

## 2020-12-04 IMAGING — MR MR KNEE*L* W/O CM
7 series · 40 of 40 positions shown · non-contrast
Comparison: None.

CLINICAL DATA: Fall 6 weeks ago, medial knee pain

EXAM:
MRI OF THE LEFT KNEE WITHOUT CONTRAST
TECHNIQUE: Multiplanar, multisequence MR imaging of the knee was performed. No
intravenous contrast was administered.

[Series 8: T2 fat-sat · axial · left · 4.0mm · 0.50mm/px · z∈[-74,+51]mm · 5 of 26 slices shown (1 of 3)]
[im 1/26]
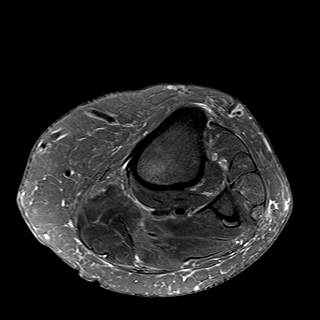
[im 7/26]
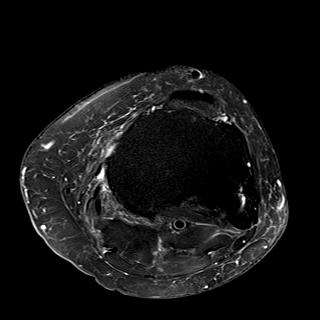
[im 13/26]
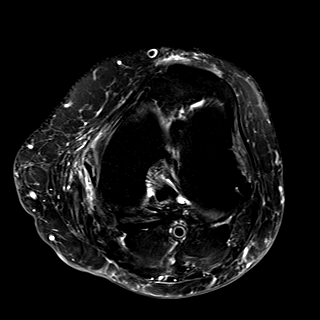
[im 19/26]
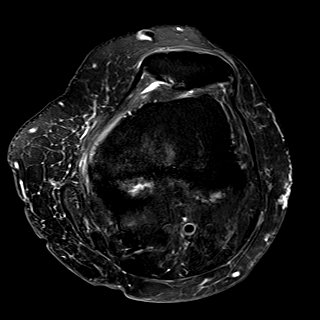
[im 26/26]
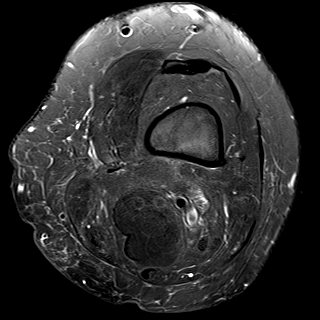

[Series 9: T2 fat-sat · coronal · left · 4.0mm · 0.59mm/px · 6 of 30 slices shown (2 of 3)]
[im 1/30]
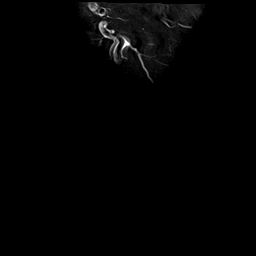
[im 6/30]
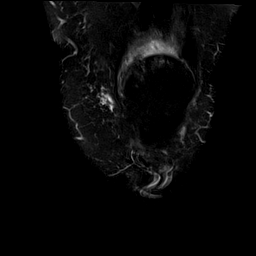
[im 12/30]
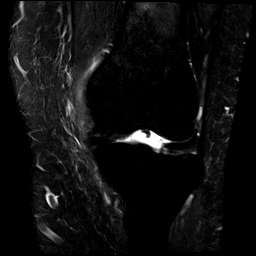
[im 18/30]
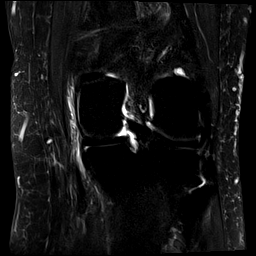
[im 24/30]
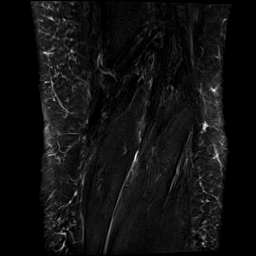
[im 30/30]
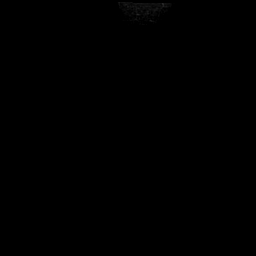

[Series 10: T1 · coronal · left · 4.0mm · 0.59mm/px · 6 of 30 slices shown]
[im 1/30]
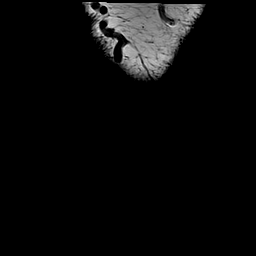
[im 6/30]
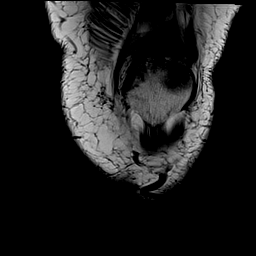
[im 12/30]
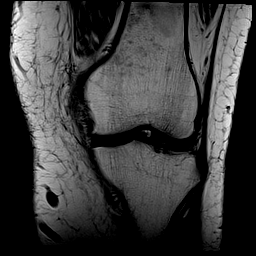
[im 18/30]
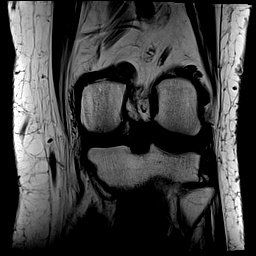
[im 24/30]
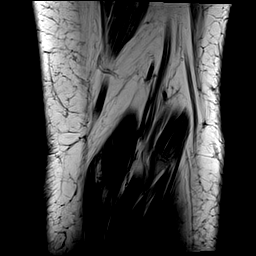
[im 30/30]
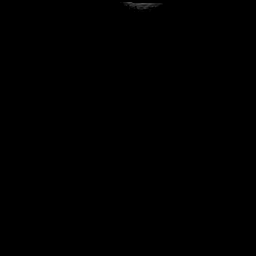

[Series 11: PD fat-sat · coronal · left · 4.0mm · 0.59mm/px · 6 of 30 slices shown (1 of 2)]
[im 1/30]
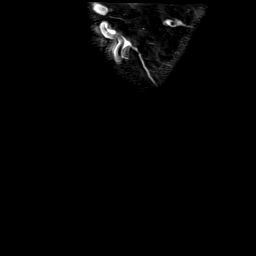
[im 6/30]
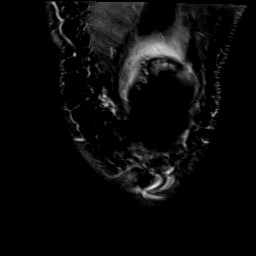
[im 12/30]
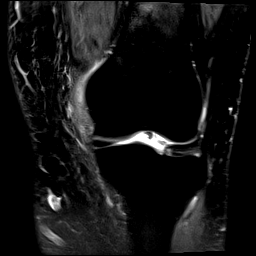
[im 18/30]
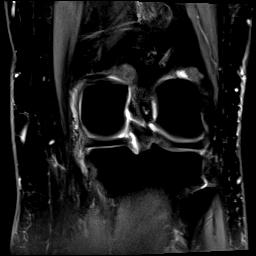
[im 24/30]
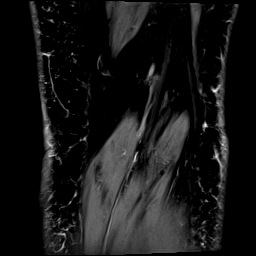
[im 30/30]
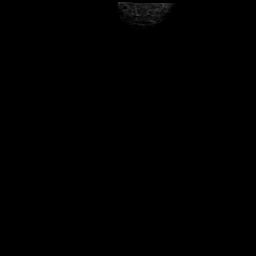

[Series 12: PD fat-sat · sagittal · left · 3.0mm · 0.59mm/px · 7 of 33 slices shown (2 of 2)]
[im 1/33]
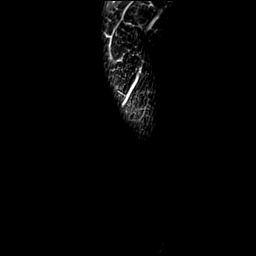
[im 6/33]
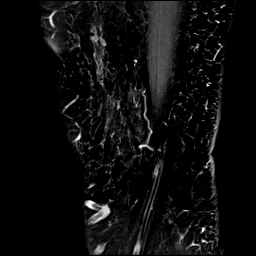
[im 11/33]
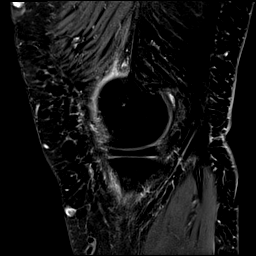
[im 17/33]
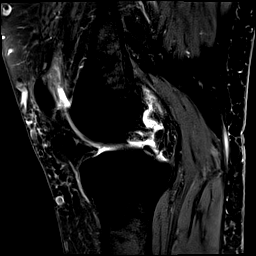
[im 22/33]
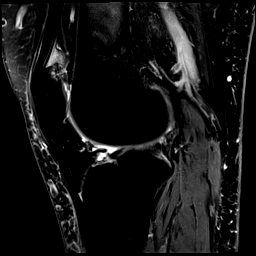
[im 27/33]
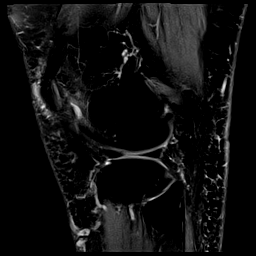
[im 33/33]
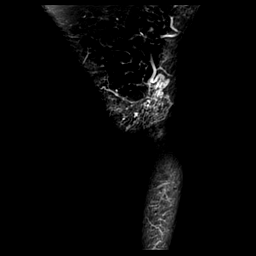

[Series 13: T2 fat-sat · sagittal · left · 3.0mm · 0.59mm/px · 7 of 36 slices shown (3 of 3)]
[im 1/36]
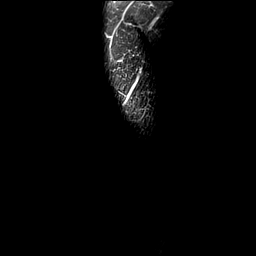
[im 6/36]
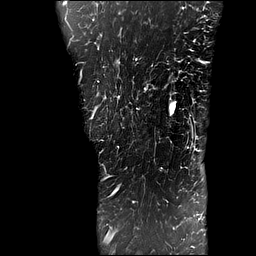
[im 12/36]
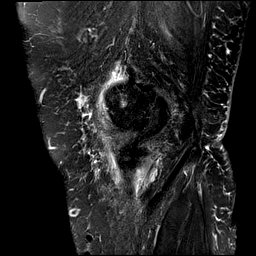
[im 18/36]
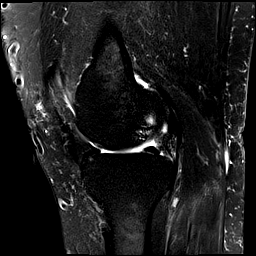
[im 24/36]
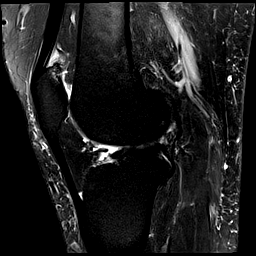
[im 30/36]
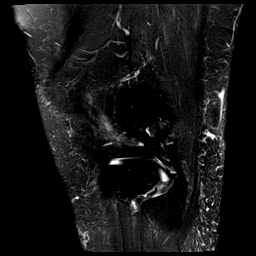
[im 36/36]
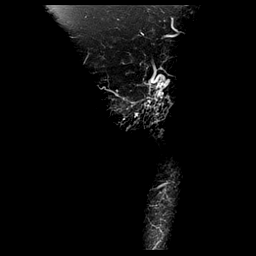

[Series 14: PD · coronal · left · 2.0mm · 0.47mm/px · 3 of 16 slices shown]
[im 1/16]
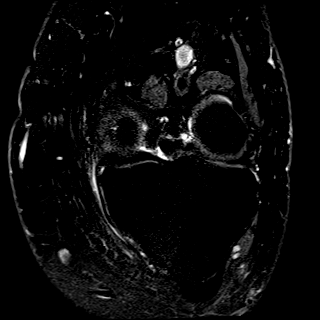
[im 8/16]
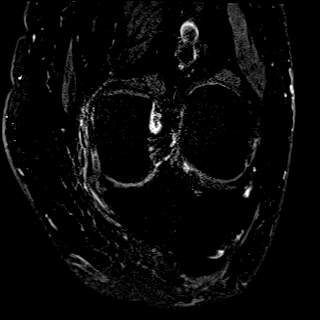
[im 16/16]
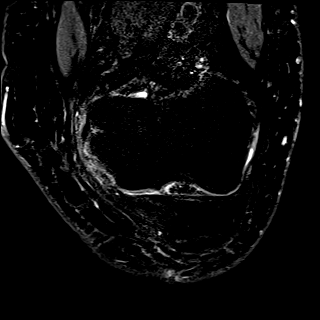

[40 of 40 positions shown; findings below may reference images not displayed]

FINDINGS: MENISCI

Medial: There is blunting of the posterior horn of the medial
meniscus. No definite meniscal tear seen.

Lateral: There is increased intrasubstance signal seen in the
posterior horn of the medial meniscus with undersurface fraying.

LIGAMENTS

Cruciates: Increased intrasubstance signal seen within the ACL,
however it is intact. The PCL is intact.

Collaterals: Partial interstitial tear of the superficial fibers of
the MCL at the femoral insertion site with surrounding soft tissue
edema and a small amount of surrounding fluid. The lateral
collateral ligamentous complex is intact.

CARTILAGE

Patellofemoral: Slight lateral subluxation of the patella. Deep
chondral fissuring seen within the central patellar apex and medial
patellar facet with subchondral cystic changes.

Medial compartment: Mild chondral thinning seen the weight-bearing
surface of the medial femoral condyle.

Lateral compartment: Mild chondral thinning seen the weight-bearing
surface of the lateral femoral condyle.

BONES: Increased marrow signal seen within the medial femoral
condyle at the MCL insertion site. No osseous fracture however
seen. No avascular necrosis. No pathologic marrow infiltration.

JOINT: There is a small knee joint effusion present. Edema seen
within the suprapatellar fat pad. No plical thickening.

EXTENSOR MECHANISM: The patellar and quadriceps tendon are intact.
The retinaculum is unremarkable.

POPLITEAL FOSSA: Tiny amount of fluid seen within the
semimembranosus recess.

OTHER: Increased intrasubstance signal seen within the
semimembranosus tendon insertion site.
IMPRESSION: 1. Grade 2 medial collateral ligamentous injury with surrounding
soft tissue edema reactive marrow at the medial femoral condyle.
2. Blunting of the posterior medial meniscus and intrasubstance
degeneration of the posterior lateral meniscus, however no definite
meniscal tear.
3. Intrasubstance sprain/mild degeneration of the ACL, however it is
intact.
4. Tricompartmental osteoarthritis, moderate to advanced within the
patellofemoral compartment.
5. Small knee joint effusion

## 2020-12-25 ENCOUNTER — Telehealth: Payer: PPO | Admitting: Nurse Practitioner

## 2020-12-25 DIAGNOSIS — R399 Unspecified symptoms and signs involving the genitourinary system: Secondary | ICD-10-CM

## 2020-12-25 MED ORDER — CEPHALEXIN 500 MG PO CAPS: 500.0000 mg | ORAL_CAPSULE | Freq: Two times a day (BID) | ORAL | 0 refills | Status: DC

## 2020-12-25 NOTE — Progress Notes (Signed)

## 2021-01-07 ENCOUNTER — Other Ambulatory Visit: Payer: Self-pay

## 2021-01-07 ENCOUNTER — Telehealth: Payer: Self-pay

## 2021-01-07 NOTE — Telephone Encounter (Unsigned)
Copied from [place] [phone]. Topic: General - Other >> Jan 07, 2021  3:54 PM Keene Breath wrote: Reason for CRM: Patient would like to speak with Baxter Flattery in regards to a UTI.  She stated that she needed more medication.  [facility].  But now it has come back and she really does not want to come into the office.  She said she would like to discuss it more with Baxter Flattery.  CB# [phone]

## 2021-01-08 ENCOUNTER — Encounter: Payer: Self-pay | Admitting: Family Medicine

## 2021-01-08 ENCOUNTER — Other Ambulatory Visit: Payer: Self-pay

## 2021-01-08 ENCOUNTER — Ambulatory Visit (INDEPENDENT_AMBULATORY_CARE_PROVIDER_SITE_OTHER): Payer: PPO | Admitting: Family Medicine

## 2021-01-08 ENCOUNTER — Other Ambulatory Visit: Payer: Self-pay | Admitting: Family Medicine

## 2021-01-08 VITALS — BP 124/80 | HR 80 | Ht 67.0 in | Wt 226.0 lb

## 2021-01-08 DIAGNOSIS — N3001 Acute cystitis with hematuria: Secondary | ICD-10-CM

## 2021-01-08 LAB — POCT URINALYSIS DIPSTICK
Bilirubin, UA: NEGATIVE
Glucose, UA: NEGATIVE
Ketones, UA: NEGATIVE
Nitrite, UA: POSITIVE
Protein, UA: POSITIVE — AB
Spec Grav, UA: 1.02 (ref 1.010–1.025)
Urobilinogen, UA: 0.2 E.U./dL
pH, UA: 7 (ref 5.0–8.0)

## 2021-01-08 MED ORDER — NITROFURANTOIN MONOHYD MACRO 100 MG PO CAPS: 100.0000 mg | ORAL_CAPSULE | Freq: Two times a day (BID) | ORAL | 0 refills | Status: DC

## 2021-01-08 NOTE — Progress Notes (Signed)
Date:  01/08/2021   Name:  Brenda Campbell   DOB:  10/29/1951   MRN:  [id number]   Chief Complaint: Urinary Tract Infection (Had a full round of keflex 500mg  bid- not any better)  Urinary Tract Infection  This is a new problem. The current episode started 1 to 4 weeks ago (10 days). The quality of the pain is described as burning. The pain is at a severity of 4/10. The pain is moderate. There has been no fever. Associated symptoms include frequency and urgency. Pertinent negatives include no chills, discharge, flank pain, hematuria, hesitancy, nausea, sweats or vomiting. She has tried antibiotics (cephalexin) for the symptoms. The treatment provided mild relief. Her past medical history is significant for kidney stones and a urological procedure. There is no history of catheterization, recurrent UTIs, a single kidney or urinary stasis.    Lab Results  Component Value Date   CREATININE 0.66 10/05/2020   BUN 13 10/05/2020   NA 139 10/05/2020   K 4.4 10/05/2020   CL 101 10/05/2020   CO2 25 10/05/2020   Lab Results  Component Value Date   CHOL 201 (H) 02/14/2020   HDL 56 02/14/2020   LDLCALC 121 (H) 02/14/2020   TRIG 138 02/14/2020   CHOLHDL 4.3 10/22/2018   Lab Results  Component Value Date   TSH 3.050 10/05/2020   No results found for: HGBA1C Lab Results  Component Value Date   WBC 5.7 02/14/2020   HGB 14.8 02/14/2020   HCT 42.4 02/14/2020   MCV 98 (H) 02/14/2020   PLT 248 02/14/2020   Lab Results  Component Value Date   ALT 13 (L) 03/15/2017   AST 17 03/15/2017   ALKPHOS 54 03/15/2017   BILITOT 0.5 03/15/2017     Review of Systems  Constitutional: Positive for fatigue. Negative for chills, fever and unexpected weight change.  HENT: Negative for congestion, ear discharge, ear pain, rhinorrhea, sinus pressure, sneezing and sore throat.   Eyes: Negative for double vision, photophobia, pain, discharge, redness and itching.  Respiratory: Negative for cough,  shortness of breath, wheezing and stridor.   Gastrointestinal: Negative for abdominal pain, blood in stool, constipation, diarrhea, nausea and vomiting.  Endocrine: Negative for cold intolerance, heat intolerance, polydipsia, polyphagia and polyuria.  Genitourinary: Positive for frequency and urgency. Negative for dysuria, flank pain, hematuria, hesitancy, menstrual problem, pelvic pain, vaginal bleeding and vaginal discharge.  Musculoskeletal: Negative for arthralgias, back pain and myalgias.  Skin: Negative for rash.  Allergic/Immunologic: Negative for environmental allergies and food allergies.  Neurological: Negative for dizziness, weakness, light-headedness, numbness and headaches.  Hematological: Negative for adenopathy. Does not bruise/bleed easily.  Psychiatric/Behavioral: Negative for dysphoric mood. The patient is not nervous/anxious.     Patient Active Problem List   Diagnosis Date Noted  . Gastrointestinal hemorrhage with melena 06/28/2017  . Blood in stool   . Acute GI bleeding   . Benign neoplasm of transverse colon   . GIB (gastrointestinal bleeding) 03/15/2017  . Panic disorder 03/13/2017  . Mild episode of recurrent major depressive disorder ([place]) 03/13/2017  . Mixed hyperlipidemia 12/23/2014  . Paroxysmal supraventricular tachycardia ([place]) 12/23/2014  . 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 Gastroesophageal reflux disease 12/22/2014  . Hypothyroidism 12/22/2014  . Obesity 12/22/2014  . Tachycardia 12/22/2014  . Malaise 12/22/2014    No Known Allergies  Past Surgical History:  Procedure Laterality Date  . CESAREAN SECTION     x 1  . COLONOSCOPY  10/12/2014  cleared for 3 years  . COLONOSCOPY N/A 03/16/2017   Procedure: COLONOSCOPY;  Surgeon: Lucilla Lame, MD;  Location: [facility] ENDOSCOPY;  Service: Endoscopy;  Laterality: N/A;  . COLONOSCOPY WITH PROPOFOL N/A 12/01/2017   Procedure: COLONOSCOPY WITH PROPOFOL;  Surgeon: [name], MD;   Location: [facility] ENDOSCOPY;  Service: Gastroenterology;  Laterality: N/A;  . ESOPHAGOGASTRODUODENOSCOPY (EGD) WITH PROPOFOL N/A 03/15/2017   Procedure: ESOPHAGOGASTRODUODENOSCOPY (EGD) WITH PROPOFOL;  Surgeon: [name], MD;  Location: [facility] ENDOSCOPY;  Service: Endoscopy;  Laterality: N/A;  . ESOPHAGOGASTRODUODENOSCOPY (EGD) WITH PROPOFOL N/A 03/30/2017   Procedure: ESOPHAGOGASTRODUODENOSCOPY (EGD) WITH PROPOFOL;  Surgeon: [name], MD;  Location: [facility] ENDOSCOPY;  Service: Endoscopy;  Laterality: N/A;  Endoscopic Capsule Placement  . ESOPHAGOGASTRODUODENOSCOPY (EGD) WITH PROPOFOL N/A 12/01/2017   Procedure: ESOPHAGOGASTRODUODENOSCOPY (EGD) WITH PROPOFOL;  Surgeon: [name], MD;  Location: [facility] ENDOSCOPY;  Service: Gastroenterology;  Laterality: N/A;  . GALLBLADDER SURGERY    . GIVENS CAPSULE STUDY N/A 03/17/2017   Procedure: GIVENS CAPSULE STUDY;  Surgeon: Lucilla Lame, MD;  Location: [facility] ENDOSCOPY;  Service: Endoscopy;  Laterality: N/A;  . GIVENS CAPSULE STUDY N/A 12/01/2017   Procedure: GIVENS CAPSULE STUDY, 12 HOUR;  Surgeon: [name], MD;  Location: [facility] ENDOSCOPY;  Service: Gastroenterology;  Laterality: N/A;  . 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 TIBIA FRACTURE SURGERY Right   . WRIST SURGERY Right    arthritis    Social History   Tobacco Use  . Smoking status: Former Research scientist (life sciences)  . Smokeless tobacco: Never Used  Vaping Use  . Vaping Use: Never used  Substance Use Topics  . Alcohol use: Yes    Alcohol/week: 9.0 standard drinks    Types: 4 Cans of beer, 5 Shots of liquor per week  . Drug use: No     Medication list has been reviewed and updated.  Current Meds  Medication Sig  . ALPRAZolam (XANAX) 0.25 MG tablet Take 1 tablet (0.25 mg total) by mouth as needed.  Marland Kitchen aspirin EC 81 MG tablet Take 81 mg by mouth daily.  Marland Kitchen levothyroxine (SYNTHROID) 50 MCG tablet Take 1 tablet (50 mcg total) by mouth daily.  Marland Kitchen omeprazole (PRILOSEC) 20 MG capsule TAKE 1 CAPSULE BY MOUTH  EVERY DAY  . sertraline (ZOLOFT) 50 MG tablet Take 1 tablet (50 mg total) by mouth daily.    PHQ 2/9 Scores 01/08/2021 10/05/2020 09/30/2020 02/14/2020  PHQ - 2 Score 0 0 0 0  PHQ- 9 Score 0 0 - 0    GAD 7 : Generalized Anxiety Score 10/05/2020 02/14/2020 07/08/2019  Nervous, Anxious, on Edge 0 0 0  Control/stop worrying 0 0 0  Worry too much - different things 0 0 0  Trouble relaxing 0 0 0  Restless 0 0 0  Easily annoyed or irritable 0 0 0  Afraid - awful might happen 0 0 0  Total GAD 7 Score 0 0 0    BP Readings from Last 3 Encounters:  01/08/21 124/80  10/05/20 120/80  02/14/20 (!) 118/58    Physical Exam Vitals and nursing note reviewed.  Constitutional:      Appearance: She is well-developed and well-nourished.  HENT:     Head: Normocephalic.     Right Ear: Tympanic membrane and external ear normal.     Left Ear: Tympanic membrane and external ear normal.     Mouth/Throat:     Mouth: Oropharynx is clear and moist.  Eyes:     General: Lids are everted,  no foreign bodies appreciated. No scleral icterus.       Left eye: No foreign body or hordeolum.     Extraocular Movements: EOM normal.     Conjunctiva/sclera: Conjunctivae normal.     Right eye: Right conjunctiva is not injected.     Left eye: Left conjunctiva is not injected.     Pupils: Pupils are equal, round, and reactive to light.  Neck:     Thyroid: No thyromegaly.     Vascular: No JVD.     Trachea: No tracheal deviation.  Cardiovascular:     Rate and Rhythm: Normal rate and regular rhythm.     Pulses: Intact distal pulses.     Heart sounds: Normal heart sounds. No murmur heard. No friction rub. No gallop.   Pulmonary:     Effort: Pulmonary effort is normal. No respiratory distress.     Breath sounds: Normal breath sounds. No wheezing, rhonchi or rales.  Abdominal:     General: Bowel sounds are normal.     Palpations: Abdomen is soft. There is no hepatosplenomegaly or mass.     Tenderness: There is no  abdominal tenderness. There is no guarding or rebound.  Musculoskeletal:        General: No tenderness or edema. Normal range of motion.     Cervical back: Normal range of motion and neck supple.  Lymphadenopathy:     Cervical: No cervical adenopathy.  Skin:    General: Skin is warm.     Findings: No rash.  Neurological:     Mental Status: She is alert and oriented to person, place, and time.     Cranial Nerves: No cranial nerve deficit.     Deep Tendon Reflexes: Strength normal. Reflexes normal.  Psychiatric:        Mood and Affect: Mood and affect normal. Mood is not anxious or depressed.     Wt Readings from Last 3 Encounters:  01/08/21 226 lb (102.5 kg)  10/05/20 229 lb (103.9 kg)  02/14/20 217 lb (98.4 kg)    BP 124/80   Pulse 80   Ht 5\' 7"  (1.702 m)   Wt 226 lb (102.5 kg)   BMI 35.40 kg/m   Assessment and Plan: 1. Acute cystitis with hematuria Acute. Likely persistent. Likely secondary to a resistant organism. Patient was treated for 7 days with cyclosporine by telemedicine medicine for UTI with cephalosporin. Initially the symptoms improved but patient had some mild residual which is now increased to the point that she had symptoms prior to initiating the initial antibiotic treatment. I suspect she has selected out a resistant organism and is currently reinfected. We will treat with Macrobid 1 twice a day and send urine for culture and sensitivities for evaluation patient has been instructed if her symptoms increase with fever chills dizziness and CVA discomfort that she is to go to the hospital. - POCT urinalysis dipstick - Urine Culture

## 2021-01-08 NOTE — Telephone Encounter (Signed)
Scheduled for 10:40

## 2021-01-11 ENCOUNTER — Telehealth: Payer: Self-pay

## 2021-01-11 ENCOUNTER — Other Ambulatory Visit: Payer: Self-pay

## 2021-01-11 ENCOUNTER — Encounter: Payer: Self-pay | Admitting: Family Medicine

## 2021-01-11 DIAGNOSIS — N3001 Acute cystitis with hematuria: Secondary | ICD-10-CM

## 2021-01-11 MED ORDER — NITROFURANTOIN MONOHYD MACRO 100 MG PO CAPS: 100.0000 mg | ORAL_CAPSULE | Freq: Two times a day (BID) | ORAL | 0 refills | Status: DC

## 2021-01-11 NOTE — Telephone Encounter (Signed)
Called and left a message to complete the antibiotic- we are waiting on the urine culture to come back. I told her to call back on Wed morning if she hasn't heard from [place] by then

## 2021-01-11 NOTE — Telephone Encounter (Signed)
Patient is calling back to speak to [place]. Patient reports that she only has one more pill left. Patent states that she is unsure that one pill will clear up the infection. Please advise.

## 2021-01-11 NOTE — Telephone Encounter (Unsigned)
Copied from [place] [id number]. Topic: General - Inquiry >> Jan 11, 2021 12:07 PM Greggory Keen D wrote: Pt called saying she does not think the antibiotic she is taking for her UTI is working.  She would like the nurse to call her back.  CB#  [phone]

## 2021-01-13 ENCOUNTER — Other Ambulatory Visit: Payer: Self-pay

## 2021-01-13 DIAGNOSIS — N3001 Acute cystitis with hematuria: Secondary | ICD-10-CM

## 2021-01-13 LAB — URINE CULTURE

## 2021-01-13 MED ORDER — CIPROFLOXACIN HCL 250 MG PO TABS: 250.0000 mg | ORAL_TABLET | Freq: Two times a day (BID) | ORAL | 0 refills | Status: DC

## 2021-01-13 NOTE — Progress Notes (Unsigned)
Sent in cipro x 3 days

## 2021-02-03 ENCOUNTER — Encounter: Payer: Self-pay | Admitting: Family Medicine

## 2021-02-04 ENCOUNTER — Telehealth: Payer: PPO

## 2021-03-16 DIAGNOSIS — D3610 Benign neoplasm of peripheral nerves and autonomic nervous system, unspecified: Secondary | ICD-10-CM | POA: Diagnosis not present

## 2021-03-16 DIAGNOSIS — L578 Other skin changes due to chronic exposure to nonionizing radiation: Secondary | ICD-10-CM | POA: Diagnosis not present

## 2021-03-16 DIAGNOSIS — D229 Melanocytic nevi, unspecified: Secondary | ICD-10-CM | POA: Diagnosis not present

## 2021-03-16 DIAGNOSIS — Z872 Personal history of diseases of the skin and subcutaneous tissue: Secondary | ICD-10-CM | POA: Diagnosis not present

## 2021-03-16 DIAGNOSIS — Z86018 Personal history of other benign neoplasm: Secondary | ICD-10-CM | POA: Diagnosis not present

## 2021-03-16 DIAGNOSIS — L853 Xerosis cutis: Secondary | ICD-10-CM | POA: Diagnosis not present

## 2021-03-16 DIAGNOSIS — L815 Leukoderma, not elsewhere classified: Secondary | ICD-10-CM | POA: Diagnosis not present

## 2021-03-16 DIAGNOSIS — L821 Other seborrheic keratosis: Secondary | ICD-10-CM | POA: Diagnosis not present

## 2021-03-23 ENCOUNTER — Ambulatory Visit (INDEPENDENT_AMBULATORY_CARE_PROVIDER_SITE_OTHER): Payer: PPO | Admitting: Family Medicine

## 2021-03-23 ENCOUNTER — Other Ambulatory Visit: Payer: Self-pay

## 2021-03-23 ENCOUNTER — Encounter: Payer: Self-pay | Admitting: Family Medicine

## 2021-03-23 VITALS — BP 130/76 | HR 64 | Ht 67.0 in | Wt 227.0 lb

## 2021-03-23 DIAGNOSIS — E039 Hypothyroidism, unspecified: Secondary | ICD-10-CM | POA: Diagnosis not present

## 2021-03-23 DIAGNOSIS — F410 Panic disorder [episodic paroxysmal anxiety] without agoraphobia: Secondary | ICD-10-CM | POA: Diagnosis not present

## 2021-03-23 DIAGNOSIS — Z23 Encounter for immunization: Secondary | ICD-10-CM | POA: Diagnosis not present

## 2021-03-23 DIAGNOSIS — F330 Major depressive disorder, recurrent, mild: Secondary | ICD-10-CM

## 2021-03-23 MED ORDER — SERTRALINE HCL 50 MG PO TABS
50.0000 mg | ORAL_TABLET | Freq: Every day | ORAL | 1 refills | Status: DC
Start: 2021-03-23 — End: 2021-09-07

## 2021-03-23 MED ORDER — ALPRAZOLAM 0.25 MG PO TABS: 0.2500 mg | ORAL_TABLET | ORAL | 1 refills | Status: DC | PRN

## 2021-03-23 MED ORDER — LEVOTHYROXINE SODIUM 50 MCG PO TABS: 50.0000 ug | ORAL_TABLET | Freq: Every day | ORAL | 1 refills | Status: DC

## 2021-03-23 NOTE — Progress Notes (Signed)
Date:  03/23/2021   Name:  Brenda Campbell   DOB:  01-20-1951   MRN:  [id number]   Chief Complaint: Depression, Hypothyroidism, and Anxiety  Depression        This is a chronic problem.  The current episode started more than 1 year ago.   The onset quality is gradual.   The problem occurs intermittently.  The problem has been gradually improving since onset.  Associated symptoms include no decreased concentration, no fatigue, no helplessness, no hopelessness, does not have insomnia, not irritable, no restlessness, no decreased interest, no appetite change, no body aches, no myalgias, no headaches, no indigestion, not sad and no suicidal ideas.  Past treatments include SSRIs - Selective serotonin reuptake inhibitors.  Compliance with treatment is good.  Past compliance problems include difficulty with treatment plan.  Previous treatment provided moderate relief.  Past medical history includes anxiety.   Anxiety Presents for follow-up visit. Patient reports no chest pain, compulsions, decreased concentration, dizziness, excessive worry, insomnia, irritability, muscle tension, nausea, nervous/anxious behavior, palpitations, restlessness, shortness of breath or suicidal ideas.      Lab Results  Component Value Date   CREATININE 0.66 10/05/2020   BUN 13 10/05/2020   NA 139 10/05/2020   K 4.4 10/05/2020   CL 101 10/05/2020   CO2 25 10/05/2020   Lab Results  Component Value Date   CHOL 201 (H) 02/14/2020   HDL 56 02/14/2020   LDLCALC 121 (H) 02/14/2020   TRIG 138 02/14/2020   CHOLHDL 4.3 10/22/2018   Lab Results  Component Value Date   TSH 3.050 10/05/2020   No results found for: HGBA1C Lab Results  Component Value Date   WBC 5.7 02/14/2020   HGB 14.8 02/14/2020   HCT 42.4 02/14/2020   MCV 98 (H) 02/14/2020   PLT 248 02/14/2020   Lab Results  Component Value Date   ALT 13 (L) 03/15/2017   AST 17 03/15/2017   ALKPHOS 54 03/15/2017   BILITOT 0.5 03/15/2017      Review of Systems  Constitutional: Negative.  Negative for appetite change, chills, fatigue, fever, irritability and unexpected weight change.  HENT: Negative for congestion, ear discharge, ear pain, rhinorrhea, sinus pressure, sneezing and sore throat.   Eyes: Negative for photophobia, pain, discharge, redness and itching.  Respiratory: Negative for cough, shortness of breath, wheezing and stridor.   Cardiovascular: Negative for chest pain, palpitations and leg swelling.  Gastrointestinal: Negative for abdominal pain, blood in stool, constipation, diarrhea, nausea and vomiting.  Endocrine: Negative for cold intolerance, heat intolerance, polydipsia, polyphagia and polyuria.  Genitourinary: Negative for dysuria, flank pain, frequency, hematuria, menstrual problem, pelvic pain, urgency, vaginal bleeding and vaginal discharge.  Musculoskeletal: Negative for arthralgias, back pain and myalgias.  Skin: Negative for rash.  Allergic/Immunologic: Negative for environmental allergies and food allergies.  Neurological: Negative for dizziness, weakness, light-headedness, numbness and headaches.  Hematological: Negative for adenopathy. Does not bruise/bleed easily.  Psychiatric/Behavioral: Positive for depression. Negative for decreased concentration, dysphoric mood and suicidal ideas. The patient is not nervous/anxious and does not have insomnia.     Patient Active Problem List   Diagnosis Date Noted  . Gastrointestinal hemorrhage with melena 06/28/2017  . Blood in stool   . Acute GI bleeding   . Benign neoplasm of transverse colon   . GIB (gastrointestinal bleeding) 03/15/2017  . Panic disorder 03/13/2017  . Mild episode of recurrent major depressive disorder ([place]) 03/13/2017  . Mixed hyperlipidemia 12/23/2014  . Paroxysmal supraventricular  tachycardia ([place]) 12/23/2014  . 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 Gastroesophageal reflux disease 12/22/2014  .  Hypothyroidism 12/22/2014  . Obesity 12/22/2014  . Tachycardia 12/22/2014  . Malaise 12/22/2014    No Known Allergies  Past Surgical History:  Procedure Laterality Date  . CESAREAN SECTION     x 1  . COLONOSCOPY  10/12/2014   cleared for 3 years  . COLONOSCOPY N/A 03/16/2017   Procedure: COLONOSCOPY;  Surgeon: Lucilla Lame, MD;  Location: [facility] ENDOSCOPY;  Service: Endoscopy;  Laterality: N/A;  . COLONOSCOPY WITH PROPOFOL N/A 12/01/2017   Procedure: COLONOSCOPY WITH PROPOFOL;  Surgeon: [name], MD;  Location: [facility] ENDOSCOPY;  Service: Gastroenterology;  Laterality: N/A;  . ESOPHAGOGASTRODUODENOSCOPY (EGD) WITH PROPOFOL N/A 03/15/2017   Procedure: ESOPHAGOGASTRODUODENOSCOPY (EGD) WITH PROPOFOL;  Surgeon: [name], MD;  Location: [facility] ENDOSCOPY;  Service: Endoscopy;  Laterality: N/A;  . ESOPHAGOGASTRODUODENOSCOPY (EGD) WITH PROPOFOL N/A 03/30/2017   Procedure: ESOPHAGOGASTRODUODENOSCOPY (EGD) WITH PROPOFOL;  Surgeon: [name], MD;  Location: [facility] ENDOSCOPY;  Service: Endoscopy;  Laterality: N/A;  Endoscopic Capsule Placement  . ESOPHAGOGASTRODUODENOSCOPY (EGD) WITH PROPOFOL N/A 12/01/2017   Procedure: ESOPHAGOGASTRODUODENOSCOPY (EGD) WITH PROPOFOL;  Surgeon: [name], MD;  Location: [facility] ENDOSCOPY;  Service: Gastroenterology;  Laterality: N/A;  . GALLBLADDER SURGERY    . GIVENS CAPSULE STUDY N/A 03/17/2017   Procedure: GIVENS CAPSULE STUDY;  Surgeon: Lucilla Lame, MD;  Location: [facility] ENDOSCOPY;  Service: Endoscopy;  Laterality: N/A;  . GIVENS CAPSULE STUDY N/A 12/01/2017   Procedure: GIVENS CAPSULE STUDY, 12 HOUR;  Surgeon: [name], MD;  Location: [facility] ENDOSCOPY;  Service: Gastroenterology;  Laterality: N/A;  . 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 TIBIA FRACTURE SURGERY Right   . WRIST SURGERY Right    arthritis    Social History   Tobacco Use  . Smoking status: Former Research scientist (life sciences)  . Smokeless tobacco: Never Used  Vaping Use  . Vaping Use: Never used  Substance Use  Topics  . Alcohol use: Yes    Alcohol/week: 9.0 standard drinks    Types: 4 Cans of beer, 5 Shots of liquor per week  . Drug use: No     Medication list has been reviewed and updated.  Current Meds  Medication Sig  . ALPRAZolam (XANAX) 0.25 MG tablet Take 1 tablet (0.25 mg total) by mouth as needed.  Marland Kitchen aspirin EC 81 MG tablet Take 81 mg by mouth daily.  Marland Kitchen levothyroxine (SYNTHROID) 50 MCG tablet Take 1 tablet (50 mcg total) by mouth daily.  Marland Kitchen omeprazole (PRILOSEC) 20 MG capsule TAKE 1 CAPSULE BY MOUTH EVERY DAY  . sertraline (ZOLOFT) 50 MG tablet Take 1 tablet (50 mg total) by mouth daily.    PHQ 2/9 Scores 03/23/2021 01/08/2021 10/05/2020 09/30/2020  PHQ - 2 Score 0 0 0 0  PHQ- 9 Score 0 0 0 -    GAD 7 : Generalized Anxiety Score 03/23/2021 10/05/2020 02/14/2020 07/08/2019  Nervous, Anxious, on Edge 0 0 0 0  Control/stop worrying 0 0 0 0  Worry too much - different things 0 0 0 0  Trouble relaxing 0 0 0 0  Restless 0 0 0 0  Easily annoyed or irritable 0 0 0 0  Afraid - awful might happen 0 0 0 0  Total GAD 7 Score 0 0 0 0    BP Readings from Last 3 Encounters:  03/23/21 130/76  01/08/21 124/80  10/05/20 120/80    Physical Exam Vitals and nursing note  reviewed.  Constitutional:      General: She is not irritable.    Appearance: She is well-developed.  HENT:     Head: Normocephalic.     Right Ear: Tympanic membrane, ear canal and external ear normal.     Left Ear: Tympanic membrane, ear canal and external ear normal.     Nose: Nose normal. No congestion or rhinorrhea.     Mouth/Throat:     Mouth: Mucous membranes are moist.     Pharynx: No oropharyngeal exudate or posterior oropharyngeal erythema.  Eyes:     General: Lids are everted, no foreign bodies appreciated. No scleral icterus.       Left eye: No foreign body or hordeolum.     Conjunctiva/sclera: Conjunctivae normal.     Right eye: Right conjunctiva is not injected.     Left eye: Left conjunctiva is not  injected.     Pupils: Pupils are equal, round, and reactive to light.  Neck:     Thyroid: No thyromegaly.     Vascular: No JVD.     Trachea: No tracheal deviation.  Cardiovascular:     Rate and Rhythm: Normal rate and regular rhythm.     Heart sounds: Normal heart sounds. No murmur heard. No friction rub. No gallop.   Pulmonary:     Effort: Pulmonary effort is normal. No respiratory distress.     Breath sounds: Normal breath sounds. No wheezing, rhonchi or rales.  Abdominal:     General: Bowel sounds are normal.     Palpations: Abdomen is soft. There is no mass.     Tenderness: There is no abdominal tenderness. There is no guarding or rebound.     Hernia: No hernia is present.  Musculoskeletal:        General: No tenderness. Normal range of motion.     Cervical back: Normal range of motion and neck supple.  Lymphadenopathy:     Cervical: No cervical adenopathy.  Skin:    General: Skin is warm.     Capillary Refill: Capillary refill takes less than 2 seconds.     Findings: No rash.  Neurological:     Mental Status: She is alert and oriented to person, place, and time.     Cranial Nerves: No cranial nerve deficit.     Deep Tendon Reflexes: Reflexes normal.  Psychiatric:        Mood and Affect: Mood is not anxious or depressed.     Wt Readings from Last 3 Encounters:  03/23/21 227 lb (103 kg)  01/08/21 226 lb (102.5 kg)  10/05/20 229 lb (103.9 kg)    BP 130/76   Pulse 64   Ht 5\' 7"  (1.702 m)   Wt 227 lb (103 kg)   BMI 35.55 kg/m   Assessment and Plan:  1. Panic disorder Chronic.  Episodic.  Patient has occasional panic attacks which are controlled with 0.25 mg Xanax on a as needed basis. - ALPRAZolam (XANAX) 0.25 MG tablet; Take 1 tablet (0.25 mg total) by mouth as needed.  Dispense: 30 tablet; Refill: 1  2. Hypothyroidism, unspecified type Chronic.  Controlled.  Stable.  We will evaluate current level of control with TSH and corresponding thyroid panel.  We will  likely be considering continuing 50 mcg Synthroid daily. - levothyroxine (SYNTHROID) 50 MCG tablet; Take 1 tablet (50 mcg total) by mouth daily.  Dispense: 90 tablet; Refill: 1 - Thyroid Panel With TSH  3. Mild episode of recurrent major depressive disorder (  HCC) Chronic.  Controlled.  Stable.  PHQ is 0 Gad score is 0 we will continue sertraline 50 mg once a day.  Will recheck in 6 months. - sertraline (ZOLOFT) 50 MG tablet; Take 1 tablet (50 mg total) by mouth daily.  Dispense: 90 tablet; Refill: 1  4. Pneumococcal vaccination administered at current visit Discussed and administered. - Pneumococcal conjugate vaccine 20-valent (Prevnar 20)

## 2021-03-24 LAB — THYROID PANEL WITH TSH
Free Thyroxine Index: 2.5 (ref 1.2–4.9)
T3 Uptake Ratio: 28 % (ref 24–39)
T4, Total: 9 ug/dL (ref 4.5–12.0)
TSH: 2.59 u[IU]/mL (ref 0.450–4.500)

## 2021-03-25 ENCOUNTER — Ambulatory Visit: Payer: PPO | Admitting: Gastroenterology

## 2021-03-25 ENCOUNTER — Other Ambulatory Visit: Payer: Self-pay

## 2021-03-25 ENCOUNTER — Encounter: Payer: Self-pay | Admitting: Gastroenterology

## 2021-03-25 VITALS — BP 116/75 | HR 76 | Ht 67.0 in | Wt 229.0 lb

## 2021-03-25 DIAGNOSIS — Z860100 Personal history of colon polyps, unspecified: Secondary | ICD-10-CM

## 2021-03-25 DIAGNOSIS — K31A0 Gastric intestinal metaplasia, unspecified: Secondary | ICD-10-CM | POA: Diagnosis not present

## 2021-03-25 DIAGNOSIS — Z8619 Personal history of other infectious and parasitic diseases: Secondary | ICD-10-CM | POA: Diagnosis not present

## 2021-03-25 MED ORDER — NA SULFATE-K SULFATE-MG SULF 17.5-3.13-1.6 GM/177ML PO SOLN: 1.0000 | Freq: Once | ORAL | 0 refills | Status: AC

## 2021-03-25 NOTE — Progress Notes (Signed)
[name] MD, MRCP(U.K) [address]  [place]  [place], [place] [address]  Main: [phone]  Fax: [phone]   Gastroenterology Consultation  Referring Provider:     Juline Patch, MD Primary Care Physician:  Juline Patch, MD Primary Gastroenterologist:  Dr. [name]  Reason for Consultation: Discuss endoscopy and colonoscopy        HPI:   Brenda Campbell is a 70 y.o. y/o female referred for consultation & management  by Dr. Juline Patch, MD.    She was last seen in my office in February 2019.  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  Was treated for H. pylori.  Colonoscopy showed blood in the terminal ileum,, few tubular [3] adenomas resected.  Subsequently a capsule study was incomplete I did not exit the stomach.  Repeat procedures performed and the capsule reached the distal ileum and ran out of space to record and no bleeding was seen till that point of time.  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  He had been taking BC powder at that point of time.  H. pylori eradication was confirmed in February 2019  Back in 2019 the plan and 6 months was to perform EGD for gastric mapping   She would require repeat colonoscopy for surveillance in 3 years which would be in 2021 December.  She is here to discuss the same.  Since her last visit she is doing well.  No complaints.  She was intending to have her procedure scheduled earlier but due to the pandemic was postponed.  No other complaints.  Not on any BC powder or Goody powder.  Not on any blood thinners.  No issues with her heart or lungs new that she is aware of  Past Medical History:  Diagnosis Date  . Anxiety   . 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 Depression   . GERD (gastroesophageal reflux disease)   . Hyperlipidemia   . Hypertension   . Thyroid disease     Past Surgical History:  Procedure Laterality Date  . CESAREAN  SECTION     x 1  . COLONOSCOPY  10/12/2014   cleared for 3 years  . COLONOSCOPY N/A 03/16/2017   Procedure: COLONOSCOPY;  Surgeon: Lucilla Lame, MD;  Location: [facility] ENDOSCOPY;  Service: Endoscopy;  Laterality: N/A;  . COLONOSCOPY WITH PROPOFOL N/A 12/01/2017   Procedure: COLONOSCOPY WITH PROPOFOL;  Surgeon: [name], MD;  Location: [facility] ENDOSCOPY;  Service: Gastroenterology;  Laterality: N/A;  . ESOPHAGOGASTRODUODENOSCOPY (EGD) WITH PROPOFOL N/A 03/15/2017   Procedure: ESOPHAGOGASTRODUODENOSCOPY (EGD) WITH PROPOFOL;  Surgeon: [name], MD;  Location: [facility] ENDOSCOPY;  Service: Endoscopy;  Laterality: N/A;  . ESOPHAGOGASTRODUODENOSCOPY (EGD) WITH PROPOFOL N/A 03/30/2017   Procedure: ESOPHAGOGASTRODUODENOSCOPY (EGD) WITH PROPOFOL;  Surgeon: [name], MD;  Location: [facility] ENDOSCOPY;  Service: Endoscopy;  Laterality: N/A;  Endoscopic Capsule Placement  . ESOPHAGOGASTRODUODENOSCOPY (EGD) WITH PROPOFOL N/A 12/01/2017   Procedure: ESOPHAGOGASTRODUODENOSCOPY (EGD) WITH PROPOFOL;  Surgeon: [name], MD;  Location: [facility] ENDOSCOPY;  Service: Gastroenterology;  Laterality: N/A;  . GALLBLADDER SURGERY    . GIVENS CAPSULE STUDY N/A 03/17/2017   Procedure: GIVENS CAPSULE STUDY;  Surgeon: Lucilla Lame, MD;  Location: [facility] ENDOSCOPY;  Service: Endoscopy;  Laterality: N/A;  . GIVENS CAPSULE STUDY N/A 12/01/2017   Procedure: GIVENS CAPSULE STUDY, 12 HOUR;  Surgeon: [name], MD;  Location: [facility] ENDOSCOPY;  Service: Gastroenterology;  Laterality: N/A;  . 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 TIBIA FRACTURE SURGERY Right   . WRIST SURGERY Right    arthritis    Prior to Admission medications   Medication Sig Start Date End Date Taking? Authorizing Provider  ALPRAZolam (XANAX) 0.25 MG tablet Take 1 tablet (0.25 mg total) by mouth as needed. 03/23/21   Juline Patch, MD  aspirin EC 81 MG tablet Take 81 mg by mouth daily.    [provider]  levothyroxine (SYNTHROID) 50 MCG tablet Take 1  tablet (50 mcg total) by mouth daily. 03/23/21   Juline Patch, MD  omeprazole (PRILOSEC) 20 MG capsule TAKE 1 CAPSULE BY MOUTH EVERY DAY 10/05/20   Juline Patch, MD  sertraline (ZOLOFT) 50 MG tablet Take 1 tablet (50 mg total) by mouth daily. 03/23/21   Juline Patch, MD    Family History  Problem Relation Age of Onset  . Diabetes Mother   . Heart disease Mother   . Breast cancer Neg Hx      Social History   Tobacco Use  . Smoking status: Former Research scientist (life sciences)  . Smokeless tobacco: Never Used  Vaping Use  . Vaping Use: Never used  Substance Use Topics  . Alcohol use: Yes    Alcohol/week: 9.0 standard drinks    Types: 4 Cans of beer, 5 Shots of liquor per week  . Drug use: No    Allergies as of 03/25/2021  . (No Known Allergies)    Review of Systems:    All systems reviewed and negative except where noted in HPI.   Physical Exam:  There were no vitals taken for this visit. No LMP recorded. Patient is postmenopausal. Psych:  Alert and cooperative. Normal mood and affect. General:   Alert,  Well-developed, well-nourished, pleasant and cooperative in NAD Head:  Normocephalic and atraumatic. Eyes:  Sclera clear, no icterus.   Conjunctiva pink. Ears:  Normal auditory acuity. Nose:  No deformity, discharge, or lesions. Mouth:  No deformity or lesions,oropharynx pink & moist. Neck:  Supple; no masses or thyromegaly. Lungs:  Respirations even and unlabored.  Clear throughout to auscultation.   No wheezes, crackles, or rhonchi. No acute distress. Heart:  Regular rate and rhythm; no murmurs, clicks, rubs, or gallops. Abdomen:  Normal bowel sounds.  No bruits.  Soft, non-tender and non-distended without masses, hepatosplenomegaly or hernias noted.  No guarding or rebound tenderness.    Neurologic:  Alert and oriented x3;  grossly normal neurologically. Psych:  Alert and cooperative. Normal mood and affect.  Imaging Studies: No results found.  Assessment and Plan:   Brenda  Ward Lampe is a 70 y.o. y/o female who was last seen and evaluated back in 2018/2019 and has a history of 3 tubular adenomas in 2018 during colonoscopy which were subcentimeter.  Also has a history of H. pylori gastritis with an intestinal metaplasia.  Suggested gastric mapping back then but did not follow-up.  History of GI bleed presumed secondary to small bowel bleeding due to NSAID usage back in 2018  Plan 1.  Colonoscopy to be performed for surveillance due to personal history of colon polyps and endoscopy at the same time for gastric mapping due to history of gastric intestinal metaplasia  I have discussed alternative options, risks & benefits,  which include, but are not limited to, bleeding, infection, perforation,respiratory complication & drug reaction.  The patient agrees with this plan & written consent  will be obtained.     Follow up in as needed  Dr [name] MD,MRCP(U.K)

## 2021-04-12 ENCOUNTER — Ambulatory Visit: Payer: PPO | Admitting: Anesthesiology

## 2021-04-12 ENCOUNTER — Encounter
Admission: RE | Disposition: A | Discharge status: Home / Self Care | Payer: Self-pay | Source: Home / Self Care | Attending: Gastroenterology

## 2021-04-12 ENCOUNTER — Encounter: Payer: Self-pay | Admitting: Gastroenterology

## 2021-04-12 ENCOUNTER — Ambulatory Visit
Admission: RE | Admit: 2021-04-12 | Discharge: 2021-04-12 | Disposition: A | Discharge status: Home / Self Care | Payer: PPO | Attending: Gastroenterology | Admitting: Gastroenterology

## 2021-04-12 DIAGNOSIS — E785 Hyperlipidemia, unspecified: Secondary | ICD-10-CM | POA: Insufficient documentation

## 2021-04-12 DIAGNOSIS — K209 Esophagitis, unspecified without bleeding: Secondary | ICD-10-CM | POA: Insufficient documentation

## 2021-04-12 DIAGNOSIS — Z79899 Other long term (current) drug therapy: Secondary | ICD-10-CM | POA: Diagnosis not present

## 2021-04-12 DIAGNOSIS — D122 Benign neoplasm of ascending colon: Secondary | ICD-10-CM | POA: Insufficient documentation

## 2021-04-12 DIAGNOSIS — D123 Benign neoplasm of transverse colon: Secondary | ICD-10-CM | POA: Insufficient documentation

## 2021-04-12 DIAGNOSIS — Z980 Intestinal bypass and anastomosis status: Secondary | ICD-10-CM | POA: Diagnosis not present

## 2021-04-12 DIAGNOSIS — Z7982 Long term (current) use of aspirin: Secondary | ICD-10-CM | POA: Diagnosis not present

## 2021-04-12 DIAGNOSIS — I10 Essential (primary) hypertension: Secondary | ICD-10-CM | POA: Insufficient documentation

## 2021-04-12 DIAGNOSIS — K635 Polyp of colon: Secondary | ICD-10-CM

## 2021-04-12 DIAGNOSIS — Z8249 Family history of ischemic heart disease and other diseases of the circulatory system: Secondary | ICD-10-CM | POA: Insufficient documentation

## 2021-04-12 DIAGNOSIS — Z86010 Personal history of colonic polyps: Secondary | ICD-10-CM

## 2021-04-12 DIAGNOSIS — K31A0 Gastric intestinal metaplasia, unspecified: Secondary | ICD-10-CM

## 2021-04-12 DIAGNOSIS — Z79890 Hormone replacement therapy (postmenopausal): Secondary | ICD-10-CM | POA: Diagnosis not present

## 2021-04-12 DIAGNOSIS — Z7689 Persons encountering health services in other specified circumstances: Secondary | ICD-10-CM | POA: Diagnosis not present

## 2021-04-12 DIAGNOSIS — Z87891 Personal history of nicotine dependence: Secondary | ICD-10-CM | POA: Diagnosis not present

## 2021-04-12 DIAGNOSIS — K2950 Unspecified chronic gastritis without bleeding: Secondary | ICD-10-CM | POA: Diagnosis not present

## 2021-04-12 DIAGNOSIS — Z1211 Encounter for screening for malignant neoplasm of colon: Secondary | ICD-10-CM | POA: Diagnosis not present

## 2021-04-12 HISTORY — PX: COLONOSCOPY WITH PROPOFOL: SHX5780

## 2021-04-12 HISTORY — PX: ESOPHAGOGASTRODUODENOSCOPY (EGD) WITH PROPOFOL: SHX5813

## 2021-04-12 SURGERY — COLONOSCOPY WITH PROPOFOL
Anesthesia: General

## 2021-04-12 MED ORDER — GLYCOPYRROLATE 0.2 MG/ML IJ SOLN
INTRAMUSCULAR | Status: AC
  Filled 2021-04-12: qty 1

## 2021-04-12 MED ORDER — LIDOCAINE HCL (PF) 2 % IJ SOLN
INTRAMUSCULAR | Status: AC
  Filled 2021-04-12: qty 5

## 2021-04-12 MED ORDER — PROPOFOL 10 MG/ML IV BOLUS
INTRAVENOUS | Status: AC
  Filled 2021-04-12: qty 20

## 2021-04-12 MED ORDER — PROPOFOL 500 MG/50ML IV EMUL
INTRAVENOUS | Status: DC | PRN
  Administered 2021-04-12: 200 ug/kg/min via INTRAVENOUS

## 2021-04-12 MED ORDER — PROPOFOL 10 MG/ML IV BOLUS
INTRAVENOUS | Status: DC | PRN
  Administered 2021-04-12: 30 mg via INTRAVENOUS
  Administered 2021-04-12: 70 mg via INTRAVENOUS

## 2021-04-12 MED ORDER — LIDOCAINE 2% (20 MG/ML) 5 ML SYRINGE
INTRAMUSCULAR | Status: DC | PRN
  Administered 2021-04-12: 100 mg via INTRAVENOUS

## 2021-04-12 MED ORDER — SODIUM CHLORIDE 0.9 % IV SOLN
INTRAVENOUS | Status: DC
  Administered 2021-04-12: 20 mL/h via INTRAVENOUS

## 2021-04-12 MED ORDER — PROPOFOL 500 MG/50ML IV EMUL
INTRAVENOUS | Status: AC
  Filled 2021-04-12: qty 50

## 2021-04-12 MED ORDER — GLYCOPYRROLATE 0.2 MG/ML IJ SOLN
INTRAMUSCULAR | Status: DC | PRN
  Administered 2021-04-12: .2 mg via INTRAVENOUS

## 2021-04-12 NOTE — Op Note (Signed)
[facility] Gastroenterology Patient Name: Brenda Campbell Procedure Date: 04/12/2021 9:38 AM MRN: [id number] Account #: [id number] Date of Birth: 17-Apr-1951 Admit Type: Outpatient Age: 70 Room: [facility] ENDO ROOM 4 Gender: Female Note Status: Finalized Procedure:             Upper GI endoscopy Indications:           Exclusion of gastric intestinal metaplasia with                         dysplasia Providers:             [name] MD, MD Referring MD:          Juline Patch, MD (Referring MD) Medicines:             Monitored Anesthesia Care Complications:         No immediate complications. Procedure:             Pre-Anesthesia Assessment:                        - Prior to the procedure, a History and Physical was                         performed, and patient medications, allergies and                         sensitivities were reviewed. The patient's tolerance                         of previous anesthesia was reviewed.                        - The risks and benefits of the procedure and the                         sedation options and risks were discussed with the                         patient. All questions were answered and informed                         consent was obtained.                        - ASA Grade Assessment: II - A patient with mild                         systemic disease.                        - ASA Grade Assessment:                        After obtaining informed consent, the endoscope was                         passed under direct vision. Throughout the procedure,                         the patient's blood pressure, pulse, and oxygen  saturations were monitored continuously. The Endoscope                         was introduced through the mouth, and advanced to the                         third part of duodenum. The upper GI endoscopy was                         accomplished with ease. The patient tolerated the                          procedure well. Findings:      LA Grade B (one or more mucosal breaks greater than 5 mm, not extending       between the tops of two mucosal folds) esophagitis with no bleeding was       found at the gastroesophageal junction.      Evidence of a previous surgical anastomosis was found in the gastric       fundus. This was characterized by healthy appearing mucosa.      Diffuse moderate inflammation characterized by congestion (edema) and       erythema was found in the gastric antrum. Biopsies were taken with a       cold forceps for histology. gastric mapping protocol      The cardia and gastric fundus were normal on retroflexion. Impression:            - LA Grade B esophagitis with no bleeding.                        - A previous surgical anastomosis was found,                         characterized by healthy appearing mucosa.                        - Gastritis. Biopsied. Recommendation:        - Await pathology results.                        - Use Prilosec (omeprazole) 40 mg PO BID for 3 months.                        - Repeat upper endoscopy in 8 weeks to evaluate the                         response to therapy. Procedure Code(s):     --- Professional ---                        [phone], Esophagogastroduodenoscopy, flexible,                         transoral; with biopsy, single or multiple Diagnosis Code(s):     --- Professional ---                        K20.90, Esophagitis, unspecified without bleeding  Z98.0, Intestinal bypass and anastomosis status                        K29.70, Gastritis, unspecified, without bleeding CPT copyright 2019 American Medical Association. All rights reserved. The codes documented in this report are preliminary and upon coder review may  be revised to meet current compliance requirements. [name], MD [name] MD, MD 04/12/2021 10:05:12 AM This report has been signed electronically. Number of Addenda:  0 Note Initiated On: 04/12/2021 9:38 AM Estimated Blood Loss:  Estimated blood loss: none.      [facility]

## 2021-04-12 NOTE — Anesthesia Preprocedure Evaluation (Signed)
Anesthesia Evaluation  Patient identified by MRN, date of birth, ID band Patient awake    Reviewed: Allergy & Precautions, H&P , NPO status , Patient's Chart, lab work & pertinent test results  History of Anesthesia Complications Negative for: history of anesthetic complications  Airway Mallampati: III  TM Distance: <3 FB Neck ROM: full    Dental  (+) Poor Dentition, Chipped, Missing   Pulmonary neg shortness of breath, former smoker,           Cardiovascular Exercise Tolerance: Good hypertension, (-) angina(-) Past MI and (-) DOE      Neuro/Psych PSYCHIATRIC DISORDERS Anxiety Depression negative neurological ROS  negative psych ROS   GI/Hepatic Neg liver ROS, GERD  Medicated and Controlled,  Endo/Other  Hypothyroidism   Renal/GU negative Renal ROS  negative genitourinary   Musculoskeletal   Abdominal   Peds  Hematology negative hematology ROS (+)   Anesthesia Other Findings Past Medical History: No date: Anxiety No date: Depression No date: GERD (gastroesophageal reflux disease) No date: Hyperlipidemia No date: Hypertension No date: Thyroid disease  Past Surgical History: No date: CESAREAN SECTION     Comment:  x 1 10/12/2014: COLONOSCOPY     Comment:  cleared for 3 years 03/16/2017: COLONOSCOPY; N/A     Comment:  Procedure: COLONOSCOPY;  Surgeon: Darren Wohl, MD;                Location: [facility] ENDOSCOPY;  Service: Endoscopy;                Laterality: N/A; 03/15/2017: ESOPHAGOGASTRODUODENOSCOPY (EGD) WITH PROPOFOL; N/A     Comment:  Procedure: ESOPHAGOGASTRODUODENOSCOPY (EGD) WITH               PROPOFOL;  Surgeon: Kiran Anna, MD;  Location: [facility]               ENDOSCOPY;  Service: Endoscopy;  Laterality: N/A; 03/30/2017: ESOPHAGOGASTRODUODENOSCOPY (EGD) WITH PROPOFOL; N/A     Comment:  Procedure: ESOPHAGOGASTRODUODENOSCOPY (EGD) WITH               PROPOFOL;  Surgeon: Kiran Anna, MD;  Location: [facility]        ENDOSCOPY;  Service: Endoscopy;  Laterality: N/A;                Endoscopic Capsule Placement No date: GALLBLADDER SURGERY 03/17/2017: GIVENS CAPSULE STUDY; N/A     Comment:  Procedure: GIVENS CAPSULE STUDY;  Surgeon: Darren Wohl,               MD;  Location: [facility] ENDOSCOPY;  Service: Endoscopy;                Laterality: N/A; No date: THROAT SURGERY     Comment:  nodule on vocal chord No date: TIBIA FRACTURE SURGERY; Right No date: WRIST SURGERY; Right     Comment:  arthritis  BMI    Body Mass Index:  32.28 kg/m      Reproductive/Obstetrics negative OB ROS                             Anesthesia Physical  Anesthesia Plan  ASA: III  Anesthesia Plan: General   Post-op Pain Management:    Induction: Intravenous  PONV Risk Score and Plan: Propofol infusion  Airway Management Planned: Natural Airway and Nasal Cannula  Additional Equipment:   Intra-op Plan:   Post-operative Plan:   Informed Consent: I have reviewed the patients History and   Physical, chart, labs and discussed the procedure including the risks, benefits and alternatives for the proposed anesthesia with the patient or authorized representative who has indicated his/her understanding and acceptance.   Dental Advisory Given  Plan Discussed with: Anesthesiologist, CRNA and Surgeon  Anesthesia Plan Comments: (Patient consented for risks of anesthesia including but not limited to:  - adverse reactions to medications - risk of intubation if required - damage to teeth, lips or other oral mucosa - sore throat or hoarseness - Damage to heart, brain, lungs or loss of life  Patient voiced understanding.)        Anesthesia Quick Evaluation  

## 2021-04-12 NOTE — H&P (Signed)
[name], MD [address] [place], [place], [place], [address] [address], [place], [place], [place], [address] Phone: [phone]  Fax: [phone]  Primary Care Physician:  Juline Patch, MD   Pre-Procedure History & Physical: HPI:  Brenda Ward Ruan is a 70 y.o. female is here for an endoscopy and colonoscopy    Past Medical History:  Diagnosis Date  . Anxiety   . 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 Depression   . GERD (gastroesophageal reflux disease)   . Hyperlipidemia   . Hypertension   . Thyroid disease     Past Surgical History:  Procedure Laterality Date  . CESAREAN SECTION     x 1  . COLONOSCOPY  10/12/2014   cleared for 3 years  . COLONOSCOPY N/A 03/16/2017   Procedure: COLONOSCOPY;  Surgeon: Lucilla Lame, MD;  Location: [facility] ENDOSCOPY;  Service: Endoscopy;  Laterality: N/A;  . COLONOSCOPY WITH PROPOFOL N/A 12/01/2017   Procedure: COLONOSCOPY WITH PROPOFOL;  Surgeon: [name], MD;  [facility] ENDOSCOPY;  Service: Gastroenterology;  Laterality: N/A;  . ESOPHAGOGASTRODUODENOSCOPY (EGD) WITH PROPOFOL N/A 03/15/2017   Procedure: ESOPHAGOGASTRODUODENOSCOPY (EGD) WITH PROPOFOL;  Surgeon: [name], MD;  Location: [facility] ENDOSCOPY;  Service: Endoscopy;  Laterality: N/A;  . ESOPHAGOGASTRODUODENOSCOPY (EGD) WITH PROPOFOL N/A 03/30/2017   Procedure: ESOPHAGOGASTRODUODENOSCOPY (EGD) WITH PROPOFOL;  Surgeon: [name], MD;  Location: [facility] ENDOSCOPY;  Service: Endoscopy;  Laterality: N/A;  Endoscopic Capsule Placement  . ESOPHAGOGASTRODUODENOSCOPY (EGD) WITH PROPOFOL N/A 12/01/2017   Procedure: ESOPHAGOGASTRODUODENOSCOPY (EGD) WITH PROPOFOL;  Surgeon: [name], MD;  Location: [facility] ENDOSCOPY;  Service: Gastroenterology;  Laterality: N/A;  . GALLBLADDER SURGERY    . GIVENS CAPSULE STUDY N/A 03/17/2017   Procedure: GIVENS CAPSULE STUDY;  Surgeon: Lucilla Lame, MD;  Location: [facility] ENDOSCOPY;  Service: Endoscopy;  Laterality: N/A;  . GIVENS CAPSULE STUDY  N/A 12/01/2017   Procedure: GIVENS CAPSULE STUDY, 12 HOUR;  Surgeon: [name], MD;  Location: [facility] ENDOSCOPY;  Service: Gastroenterology;  Laterality: N/A;  . 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 TIBIA FRACTURE SURGERY Right   . WRIST SURGERY Right    arthritis    Prior to Admission medications   Medication Sig Start Date End Date Taking? Authorizing Provider  ALPRAZolam (XANAX) 0.25 MG tablet Take 1 tablet (0.25 mg total) by mouth as needed. 03/23/21  Yes Juline Patch, MD  aspirin EC 81 MG tablet Take 81 mg by mouth daily.   Yes [provider]  levothyroxine (SYNTHROID) 50 MCG tablet Take 1 tablet (50 mcg total) by mouth daily. 03/23/21  Yes Juline Patch, MD  omeprazole (PRILOSEC) 20 MG capsule TAKE 1 CAPSULE BY MOUTH EVERY DAY 10/05/20  Yes Juline Patch, MD  sertraline (ZOLOFT) 50 MG tablet Take 1 tablet (50 mg total) by mouth daily. 03/23/21  Yes Juline Patch, MD    Allergies as of 03/25/2021  . (No Known Allergies)    Family History  Problem Relation Age of Onset  . Diabetes Mother   . Heart disease Mother   . Breast cancer Neg Hx     Social History   Socioeconomic History  . Marital status: Married    Spouse name: Not on file  . Number of children: Not on file  . Years of education: Not on file  . Highest education level: Not on file  Occupational History  . Not on file  Tobacco Use  .  Smoking status: Former Research scientist (life sciences)  . Smokeless tobacco: Never Used  Vaping Use  . Vaping Use: Never used  Substance and Sexual Activity  . Alcohol use: Yes    Alcohol/week: 9.0 standard drinks    Types: 4 Cans of beer, 5 Shots of liquor per week  . Drug use: No  . Sexual activity: Not Currently  Other Topics Concern  . Not on file  Social History Narrative  . Not on file   Social Determinants of Health   Financial Resource Strain: Low Risk   . Difficulty of Paying Living Expenses: Not hard at all  Food Insecurity: No Food Insecurity  . Worried  About Charity fundraiser in the Last Year: Never true  . Ran Out of Food in the Last Year: Never true  Transportation Needs: No Transportation Needs  . Lack of Transportation (Medical): No  . Lack of Transportation (Non-Medical): No  Physical Activity: Insufficiently Active  . Days of Exercise per Week: 2 days  . Minutes of Exercise per Session: 60 min  Stress: No Stress Concern Present  . Feeling of Stress : Not at all  Social Connections: Moderately Integrated  . Frequency of Communication with Friends and Family: More than three times a week  . Frequency of Social Gatherings with Friends and Family: More than three times a week  . Attends Religious Services: Never  . Active Member of Clubs or Organizations: Yes  . Attends Archivist Meetings: More than 4 times per year  . Marital Status: Married  Human resources officer Violence: Not At Risk  . Fear of Current or Ex-Partner: No  . Emotionally Abused: No  . Physically Abused: No  . Sexually Abused: No    Review of Systems: See HPI, otherwise negative ROS  Physical Exam: BP (!) 162/81   Pulse 62   Temp 97.8 F (36.6 C) (Temporal)   Resp 18   Ht 5' 6.5" (1.689 m)   Wt 103.9 kg   SpO2 98%   BMI 36.41 kg/m  General:   Alert,  pleasant and cooperative in NAD Head:  Normocephalic and atraumatic. Neck:  Supple; no masses or thyromegaly. Lungs:  Clear throughout to auscultation, normal respiratory effort.    Heart:  +S1, +S2, Regular rate and rhythm, No edema. Abdomen:  Soft, nontender and nondistended. Normal bowel sounds, without guarding, and without rebound.   Neurologic:  Alert and  oriented x4;  grossly normal neurologically.  Impression/Plan: Brenda Campbell is here for an endoscopy and colonoscopy  to be performed for  evaluation of gastric intestinal metaplasia and colon cancer screening with history of colon polyps    Risks, benefits, limitations, and alternatives regarding endoscopy have been reviewed  with the patient.  Questions have been answered.  All parties agreeable.   [name], MD  04/12/2021, 9:38 AM

## 2021-04-12 NOTE — Transfer of Care (Signed)
Immediate Anesthesia Transfer of Care Note  Patient: Brenda Campbell  Procedure(s) Performed: COLONOSCOPY WITH PROPOFOL (N/A ) ESOPHAGOGASTRODUODENOSCOPY (EGD) WITH PROPOFOL (N/A )  Patient Location: Endoscopy Unit  Anesthesia Type:General  Level of Consciousness: awake, alert  and oriented  Airway & Oxygen Therapy: Patient connected to nasal cannula oxygen  Post-op Assessment: Post -op Vital signs reviewed and stable  Post vital signs: stable  Last Vitals:  Vitals Value Taken Time  BP 115/45   Temp    Pulse 64 04/12/21 1038  Resp    SpO2 100 % 04/12/21 1038  Vitals shown include unvalidated device data.  Last Pain:  Vitals:   04/12/21 0842  TempSrc: Temporal  PainSc: 0-No pain         Complications: No complications documented.

## 2021-04-12 NOTE — Op Note (Signed)
[facility] Patient Name: Brenda Campbell Procedure Date: 04/12/2021 9:37 AM MRN: [id number] Account #: [id number] Date of Birth: 1951-06-09 Admit Type: Outpatient Age: 70 Room: [facility] ENDO ROOM 4 Gender: Female Note Status: Finalized Procedure:             Colonoscopy Indications:           High risk colon cancer surveillance: Personal history                         of colonic polyps, Surveillance: Personal history of                         adenomatous polyps on last colonoscopy 3 years ago Providers:             [name] MD, MD Referring MD:          Juline Patch, MD (Referring MD) Medicines:             Monitored Anesthesia Care Complications:         No immediate complications. Procedure:             Pre-Anesthesia Assessment:                        - Prior to the procedure, a History and Physical was                         performed, and patient medications, allergies and                         sensitivities were reviewed. The patient's tolerance                         of previous anesthesia was reviewed.                        - The risks and benefits of the procedure and the                         sedation options and risks were discussed with the                         patient. All questions were answered and informed                         consent was obtained.                        - ASA Grade Assessment: II - A patient with mild                         systemic disease.                        After obtaining informed consent, the colonoscope was                         passed under direct vision. Throughout the procedure,                         the  patient's blood pressure, pulse, and oxygen                         saturations were monitored continuously. The                         Colonoscope was introduced through the anus and                         advanced to the the cecum, identified by the                          appendiceal orifice. The colonoscopy was performed                         with ease. The patient tolerated the procedure well.                         The quality of the bowel preparation was good. Findings:      The perianal and digital rectal examinations were normal.      Five sessile polyps were found in the ascending colon. The polyps were 5       to 8 mm in size. These polyps were removed with a cold snare. Resection       and retrieval were complete.      Two sessile polyps were found in the transverse colon. The polyps were 5       to 7 mm in size. These polyps were removed with a cold snare. Resection       and retrieval were complete.      A 5 mm polyp was found in the sigmoid colon. The polyp was sessile. The       polyp was removed with a cold snare. Resection and retrieval were       complete.      Multiple small-mouthed diverticula were found in the sigmoid colon.      The exam was otherwise without abnormality on direct and retroflexion       views. Impression:            - Five 5 to 8 mm polyps in the ascending colon,                         removed with a cold snare. Resected and retrieved.                        - Two 5 to 7 mm polyps in the transverse colon,                         removed with a cold snare. Resected and retrieved.                        - One 5 mm polyp in the sigmoid colon, removed with a                         cold snare. Resected and retrieved.                        - Diverticulosis in the sigmoid colon.                        -  The examination was otherwise normal on direct and                         retroflexion views. Recommendation:        - Discharge patient to home (with escort).                        - Resume previous diet.                        - Continue present medications.                        - Await pathology results.                        - Repeat colonoscopy for surveillance based on                         pathology  results. Procedure Code(s):     --- Professional ---                        [phone], Colonoscopy, flexible; with removal of                         tumor(s), polyp(s), or other lesion(s) by snare                         technique Diagnosis Code(s):     --- Professional ---                        K63.5, Polyp of colon                        Z86.010, Personal history of colonic polyps                        K57.30, Diverticulosis of large intestine without                         perforation or abscess without bleeding CPT copyright 2019 American Medical Association. All rights reserved. The codes documented in this report are preliminary and upon coder review may  be revised to meet current compliance requirements. [name], MD [name] MD, MD 04/12/2021 10:39:10 AM This report has been signed electronically. Number of Addenda: 0 Note Initiated On: 04/12/2021 9:37 AM Scope Withdrawal Time: 0 hours 22 minutes 57 seconds  Total Procedure Duration: 0 hours 26 minutes 27 seconds  Estimated Blood Loss:  Estimated blood loss: none.      [facility]

## 2021-04-12 NOTE — Anesthesia Postprocedure Evaluation (Signed)
Anesthesia Post Note  Patient: Brenda Campbell  Procedure(s) Performed: COLONOSCOPY WITH PROPOFOL (N/A ) ESOPHAGOGASTRODUODENOSCOPY (EGD) WITH PROPOFOL (N/A )  Patient location during evaluation: Endoscopy Anesthesia Type: General Level of consciousness: awake and alert Pain management: pain level controlled Vital Signs Assessment: post-procedure vital signs reviewed and stable Respiratory status: spontaneous breathing, nonlabored ventilation, respiratory function stable and patient connected to nasal cannula oxygen Cardiovascular status: blood pressure returned to baseline and stable Postop Assessment: no apparent nausea or vomiting Anesthetic complications: no   No complications documented.   Last Vitals:  Vitals:   04/12/21 1059 04/12/21 1109  BP: (!) 127/59 134/74  Pulse: (!) 58 60  Resp: 17 14  Temp:    SpO2: 97% 99%    Last Pain:  Vitals:   04/12/21 1109  TempSrc:   PainSc: 0-No pain                 Precious Haws Nariyah Osias

## 2021-04-13 ENCOUNTER — Encounter: Payer: Self-pay | Admitting: Gastroenterology

## 2021-04-13 LAB — SURGICAL PATHOLOGY

## 2021-04-28 ENCOUNTER — Telehealth: Payer: Self-pay

## 2021-04-28 NOTE — Telephone Encounter (Signed)
Called patient to schedule a repeat EGD. Pt declined at this time. Will contact office when ready to schedule.

## 2021-05-04 ENCOUNTER — Telehealth: Payer: PPO | Admitting: Nurse Practitioner

## 2021-05-04 DIAGNOSIS — N3000 Acute cystitis without hematuria: Secondary | ICD-10-CM | POA: Diagnosis not present

## 2021-05-04 MED ORDER — SULFAMETHOXAZOLE-TRIMETHOPRIM 800-160 MG PO TABS: 1.0000 | ORAL_TABLET | Freq: Two times a day (BID) | ORAL | 0 refills | Status: AC

## 2021-05-04 NOTE — Progress Notes (Signed)
We are sorry that you are not feeling well.  Here is how we plan to help!  Based on what you shared with me it looks like you most likely have a urinary tract infection.  I reviewed your chart and saw that you had a urine culture done earlier this year, we will use an antibiotic that will cover the bacteria that was found in that culture as well as other common bacteria that is present with UTI symptoms.   A UTI (Urinary Tract Infection) is a bacterial infection of the bladder.  Most cases of urinary tract infections are simple to treat but a key part of your care is to encourage you to drink plenty of fluids and watch your symptoms carefully.  I have prescribed Bactrim DS One tablet twice a day for 5 days.  Your symptoms should gradually improve. Call us if the burning in your urine worsens, you develop worsening fever, back pain or pelvic pain or if your symptoms do not resolve after completing the antibiotic.  Urinary tract infections can be prevented by drinking plenty of water to keep your body hydrated.  Also be sure when you wipe, wipe from front to back and don't hold it in!  If possible, empty your bladder every 4 hours.  Your e-visit answers were reviewed by a board certified advanced clinical practitioner to complete your personal care plan.  Depending on the condition, your plan could have included both over the counter or prescription medications.  If there is a problem please reply  once you have received a response from your provider.  Your safety is important to [place].  If you have drug allergies check your prescription carefully.    You can use MyChart to ask questions about today's visit, request a non-urgent call back, or ask for a work or school excuse for 24 hours related to this e-Visit. If it has been greater than 24 hours you will need to follow up with your provider, or enter a new e-Visit to address those concerns.   You will get an e-mail in the next two days asking  about your experience.  I hope that your e-visit has been valuable and will speed your recovery. Thank you for using e-visits.  Meds ordered this encounter  Medications  . sulfamethoxazole-trimethoprim (BACTRIM DS) 800-160 MG tablet    Sig: Take 1 tablet by mouth 2 (two) times daily for 5 days.    Dispense:  10 tablet    Refill:  0    I spent ten minutes reviewing the history, lab results and symptoms of this patient along with time spent coordinating care for her.

## 2021-06-15 ENCOUNTER — Telehealth: Payer: PPO | Admitting: Physician Assistant

## 2021-06-15 DIAGNOSIS — R300 Dysuria: Secondary | ICD-10-CM | POA: Diagnosis not present

## 2021-06-15 MED ORDER — CEPHALEXIN 500 MG PO CAPS: 500.0000 mg | ORAL_CAPSULE | Freq: Two times a day (BID) | ORAL | 0 refills | Status: AC

## 2021-06-15 NOTE — Progress Notes (Signed)

## 2021-06-15 NOTE — Progress Notes (Signed)
I have spent 5 minutes in review of e-visit questionnaire, review and updating patient chart, medical decision making and response to patient.   Ulas Zuercher Cody Kelly Ranieri, PA-C    

## 2021-06-22 DIAGNOSIS — H2513 Age-related nuclear cataract, bilateral: Secondary | ICD-10-CM | POA: Diagnosis not present

## 2021-07-07 ENCOUNTER — Telehealth: Payer: PPO | Admitting: Family

## 2021-07-07 DIAGNOSIS — R399 Unspecified symptoms and signs involving the genitourinary system: Secondary | ICD-10-CM

## 2021-07-07 NOTE — Progress Notes (Signed)
Based on what you shared with me, I feel your condition warrants further evaluation and I recommend that you be seen in a face to face visit.  You have been treated for a UTI twice through an Evisit in the last two months. At this time, you need to be seen in person for urine culture.    NOTE: There will be NO CHARGE for this eVisit   If you are having a true medical emergency please call 911.      For an urgent face to face visit, [facility] has six urgent care centers for your convenience:     [facility] [place] [place] [facility] [place] Get Driving Directions [id number] [place], [place] [address]    [place] [facility]) Get Driving Directions [id number] [place], [place] [address]  [place] [facility] [place] ([place]) Get Driving Directions [id number] [address] [place],  [place]  [address]  [facility] Get Driving Directions [id number] [place], [place], [place] [address]   [facility] Get Driving Directions  [id number] [address], [place] [address]   [facility] [place] Get Driving Directions [id number] [address], [place], [place] [address]  Your MyChart E-visit questionnaire answers were reviewed by a board certified advanced clinical practitioner to complete your personal care plan based on your specific symptoms.  Thank you for using e-Visits.

## 2021-07-08 ENCOUNTER — Ambulatory Visit (INDEPENDENT_AMBULATORY_CARE_PROVIDER_SITE_OTHER): Payer: PPO | Admitting: Family Medicine

## 2021-07-08 ENCOUNTER — Encounter: Payer: Self-pay | Admitting: Family Medicine

## 2021-07-08 ENCOUNTER — Other Ambulatory Visit: Payer: Self-pay

## 2021-07-08 VITALS — BP 130/84 | HR 68 | Ht 66.5 in | Wt 231.0 lb

## 2021-07-08 DIAGNOSIS — N3941 Urge incontinence: Secondary | ICD-10-CM

## 2021-07-08 DIAGNOSIS — N390 Urinary tract infection, site not specified: Secondary | ICD-10-CM

## 2021-07-08 LAB — POCT URINALYSIS DIPSTICK
Bilirubin, UA: NEGATIVE
Glucose, UA: NEGATIVE
Ketones, UA: NEGATIVE
Nitrite, UA: NEGATIVE
Protein, UA: NEGATIVE
Spec Grav, UA: 1.02 (ref 1.010–1.025)
Urobilinogen, UA: 0.2 E.U./dL
pH, UA: 6 (ref 5.0–8.0)

## 2021-07-08 MED ORDER — NITROFURANTOIN MONOHYD MACRO 100 MG PO CAPS: 100.0000 mg | ORAL_CAPSULE | Freq: Two times a day (BID) | ORAL | 0 refills | Status: DC

## 2021-07-08 NOTE — Progress Notes (Signed)
Date:  07/08/2021   Name:  Brenda Campbell   DOB:  1951/03/19   MRN:  [id number]   Chief Complaint: Urinary Tract Infection (Symptoms subsided last night- was having burning and urge to go), Urinary Incontinence (Urge incontinence), and shingrix (Had first shot in 2019)  Urinary Tract Infection  This is a recurrent problem. The current episode started more than 1 month ago. The problem occurs intermittently. The problem has been waxing and waning. The quality of the pain is described as burning. The patient is experiencing no pain. Associated symptoms include frequency and urgency. Pertinent negatives include no chills, hematuria or nausea. She has tried increased fluids and antibiotics for the symptoms. The treatment provided no relief.  Female GU Problem Primary symptoms comment: urge sx/recurrent utis. This is a recurrent problem. The current episode started more than 1 month ago. The problem has been waxing and waning. The pain is mild. Associated symptoms include dysuria, frequency and urgency. Pertinent negatives include no abdominal pain, back pain, chills, constipation, diarrhea, fever, headaches, hematuria, nausea, rash or sore throat.   Lab Results  Component Value Date   CREATININE 0.66 10/05/2020   BUN 13 10/05/2020   NA 139 10/05/2020   K 4.4 10/05/2020   CL 101 10/05/2020   CO2 25 10/05/2020   Lab Results  Component Value Date   CHOL 201 (H) 02/14/2020   HDL 56 02/14/2020   LDLCALC 121 (H) 02/14/2020   TRIG 138 02/14/2020   CHOLHDL 4.3 10/22/2018   Lab Results  Component Value Date   TSH 2.590 03/23/2021   No results found for: HGBA1C Lab Results  Component Value Date   WBC 5.7 02/14/2020   HGB 14.8 02/14/2020   HCT 42.4 02/14/2020   MCV 98 (H) 02/14/2020   PLT 248 02/14/2020   Lab Results  Component Value Date   ALT 13 (L) 03/15/2017   AST 17 03/15/2017   ALKPHOS 54 03/15/2017   BILITOT 0.5 03/15/2017     Review of Systems  Constitutional:   Negative for chills and fever.  HENT:  Negative for drooling, ear discharge, ear pain and sore throat.   Respiratory:  Negative for cough, shortness of breath and wheezing.   Cardiovascular:  Negative for chest pain, palpitations and leg swelling.  Gastrointestinal:  Negative for abdominal pain, blood in stool, constipation, diarrhea and nausea.  Endocrine: Negative for polydipsia.  Genitourinary:  Positive for dysuria, frequency and urgency. Negative for hematuria.  Musculoskeletal:  Negative for back pain, myalgias and neck pain.  Skin:  Negative for rash.  Allergic/Immunologic: Negative for environmental allergies.  Neurological:  Negative for dizziness and headaches.  Hematological:  Does not bruise/bleed easily.  Psychiatric/Behavioral:  Negative for suicidal ideas. The patient is not nervous/anxious.    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   Gastroesophageal reflux disease 12/22/2014   Hypothyroidism 12/22/2014   Obesity 12/22/2014   Tachycardia 12/22/2014   Malaise 12/22/2014    No Known Allergies  Past Surgical History:  Procedure Laterality Date   CESAREAN SECTION     x 1  COLONOSCOPY  10/12/2014   cleared for 3 years   COLONOSCOPY N/A 03/16/2017   Procedure: COLONOSCOPY;  Surgeon: Lucilla Lame, MD;  Location: [facility] ENDOSCOPY;  Service: Endoscopy;  Laterality: N/A;   COLONOSCOPY WITH PROPOFOL N/A 12/01/2017   Procedure: COLONOSCOPY WITH PROPOFOL;  Surgeon: [name], MD;  Location: [facility] ENDOSCOPY;  Service: Gastroenterology;   Laterality: N/A;   COLONOSCOPY WITH PROPOFOL N/A 04/12/2021   Procedure: COLONOSCOPY WITH PROPOFOL;  Surgeon: [name], MD;  Location: [facility] ENDOSCOPY;  Service: Gastroenterology;  Laterality: N/A;   ESOPHAGOGASTRODUODENOSCOPY (EGD) WITH PROPOFOL N/A 03/15/2017   Procedure: ESOPHAGOGASTRODUODENOSCOPY (EGD) WITH PROPOFOL;  Surgeon: [name], MD;  Location: [facility] ENDOSCOPY;  Service: Endoscopy;  Laterality: N/A;   ESOPHAGOGASTRODUODENOSCOPY (EGD) WITH PROPOFOL N/A 03/30/2017   Procedure: ESOPHAGOGASTRODUODENOSCOPY (EGD) WITH PROPOFOL;  Surgeon: [name], MD;  Location: [facility] ENDOSCOPY;  Service: Endoscopy;  Laterality: N/A;  Endoscopic Capsule Placement   ESOPHAGOGASTRODUODENOSCOPY (EGD) WITH PROPOFOL N/A 12/01/2017   Procedure: ESOPHAGOGASTRODUODENOSCOPY (EGD) WITH PROPOFOL;  Surgeon: [name], MD;  Location[facility] ENDOSCOPY;  Service: Gastroenterology;  Laterality: N/A;   ESOPHAGOGASTRODUODENOSCOPY (EGD) WITH PROPOFOL N/A 04/12/2021   Procedure: ESOPHAGOGASTRODUODENOSCOPY (EGD) WITH PROPOFOL;  Surgeon: [name], MD;  Location: [facility] ENDOSCOPY;  Service: Gastroenterology;  Laterality: N/A;   GALLBLADDER SURGERY     GIVENS CAPSULE STUDY N/A 03/17/2017   Procedure: GIVENS CAPSULE STUDY;  Surgeon: Lucilla Lame, MD;  Location: [facility] ENDOSCOPY;  Service: Endoscopy;  Laterality: N/A;   GIVENS CAPSULE STUDY N/A 12/01/2017   Procedure: GIVENS CAPSULE STUDY, 12 HOUR;  Surgeon: [name], MD;  [facility] END[facility]COPY;  Service: Gastroenterology;  Laterality: N/A;   THROAT SURGERY     nodule on vocal chord   TIBIA FRACTURE SURGERY Right    WRIST SURGERY Right    arthritis    Social History   Tobacco Use   Smoking status: Former   Smokeless tobacco: Never  Vaping Use   Vaping Use: Never used  Substance Use Topics   Alcohol use: Yes    Alcohol/week: 9.0 standard drinks    Types: 4 Cans of beer, 5 Shots of liquor per week   Drug use: No     Medication list has been reviewed and  updated.  Current Meds  Medication Sig   ALPRAZolam (XANAX) 0.25 MG tablet Take 1 tablet (0.25 mg total) by mouth as needed.   levothyroxine (SYNTHROID) 50 MCG tablet Take 1 tablet (50 mcg total) by mouth daily.   omeprazole (PRILOSEC) 20 MG capsule TAKE 1 CAPSULE BY MOUTH EVERY DAY   sertraline (ZOLOFT) 50 MG tablet Take 1 tablet (50 mg total) by mouth daily.    PHQ 2/9 Scores 07/08/2021 03/23/2021 01/08/2021 10/05/2020  PHQ - 2 Score 0 0 0 0  PHQ- 9 Score 0 0 0 0    GAD 7 : Generalized Anxiety Score 07/08/2021 03/23/2021 10/05/2020 02/14/2020  Nervous, Anxious, on Edge 0 0 0 0  Control/stop worrying 0 0 0 0  Worry too much - different things 0 0 0 0  Trouble relaxing 0 0 0 0  Restless 0 0 0 0  Easily annoyed or irritable 0 0 0 0  Afraid - awful might happen 0 0 0 0  Total GAD 7 Score 0 0 0 0    BP Readings from Last 3 Encounters:  07/08/21 130/84  04/12/21 134/74  03/25/21 116/75    Physical Exam Vitals and nursing note reviewed.  Constitutional:      General: She is not in acute distress.  Appearance: She is not diaphoretic.  HENT:     Head: Normocephalic and atraumatic.     Right Ear: Tympanic membrane and external ear normal.     Left Ear: Tympanic membrane and external ear normal.     Nose: Nose normal.  Eyes:     General:        Right eye: No discharge.        Left eye: No discharge.     Conjunctiva/sclera: Conjunctivae normal.     Pupils: Pupils are equal, round, and reactive to light.  Neck:     Thyroid: No thyromegaly.     Vascular: No JVD.  Cardiovascular:     Rate and Rhythm: Normal rate and regular rhythm.     Heart sounds: Normal heart sounds. No murmur heard.   No friction rub. No gallop.  Pulmonary:     Effort: Pulmonary effort is normal.     Breath sounds: Normal breath sounds. No wheezing or rhonchi.  Abdominal:     General: Bowel sounds are normal.     Palpations: Abdomen is soft. There is no mass.     Tenderness: There is no abdominal  tenderness. There is no guarding.  Musculoskeletal:        General: Normal range of motion.     Cervical back: Normal range of motion and neck supple.  Lymphadenopathy:     Cervical: No cervical adenopathy.  Skin:    General: Skin is warm and dry.  Neurological:     Mental Status: She is alert.     Deep Tendon Reflexes: Reflexes are normal and symmetric.    Wt Readings from Last 3 Encounters:  07/08/21 231 lb (104.8 kg)  04/12/21 229 lb (103.9 kg)  03/25/21 229 lb (103.9 kg)    BP 130/84   Pulse 68   Ht 5' 6.5" (1.689 m)   Wt 231 lb (104.8 kg)   BMI 36.73 kg/m   Assessment and Plan:  1. Recurrent UTI New onset.  Episodic.  Beginning in May every 2 months patient's had recurrent episodes of urinary tract infections treated by virtual visits.  On the third such episode over a 71-monthperiod it was suggested patient come in for face-to-face for evaluation for the recurrence of the infections.  Dipstick urinalysis is consistent with UTI.  We will treat with Macrobid 100 mg twice a day for 7 days and send urine for culture given that this is a recurrence and to rule out a possibility of a nonsensitive organism.  We will also refer to urology for because of the urge symptoms as well as recurrence of infections as if there may be residual urine. - Ambulatory referral to Urology - POCT urinalysis dipstick - nitrofurantoin, macrocrystal-monohydrate, (MACROBID) 100 MG capsule; Take 1 capsule (100 mg total) by mouth 2 (two) times daily for 7 days.  Dispense: 14 capsule; Refill: 0 - Urine Culture  2. Urge incontinence New onset.  Patient probably has a degree of urge incontinence as well and we will also refer to urology for evaluation possible treatment. - Ambulatory referral to Urology - POCT urinalysis dipstick

## 2021-07-12 ENCOUNTER — Other Ambulatory Visit: Payer: Self-pay

## 2021-07-12 LAB — URINE CULTURE

## 2021-07-12 MED ORDER — CIPROFLOXACIN HCL 250 MG PO TABS: 250.0000 mg | ORAL_TABLET | Freq: Two times a day (BID) | ORAL | 0 refills | Status: AC

## 2021-07-31 ENCOUNTER — Encounter: Payer: Self-pay | Admitting: Family Medicine

## 2021-08-03 ENCOUNTER — Telehealth: Payer: Self-pay

## 2021-08-03 ENCOUNTER — Encounter: Payer: Self-pay | Admitting: Family Medicine

## 2021-08-03 NOTE — Telephone Encounter (Signed)
Copied from [place] [id number]. Topic: General - Other >> Aug 03, 2021 11:51 AM Brenda Campbell wrote: Reason for CRM: Pt thinks her UTI is coming back/ pt is experiencing frequent urination and burning/ pt has a urology appt but it is not until 9.2.22/ Pt asked to speak with nurse/ please advise

## 2021-08-04 ENCOUNTER — Telehealth: Payer: Self-pay

## 2021-08-04 ENCOUNTER — Other Ambulatory Visit: Payer: Self-pay

## 2021-08-04 ENCOUNTER — Ambulatory Visit (INDEPENDENT_AMBULATORY_CARE_PROVIDER_SITE_OTHER): Payer: PPO | Admitting: Family Medicine

## 2021-08-04 ENCOUNTER — Encounter: Payer: Self-pay | Admitting: Family Medicine

## 2021-08-04 VITALS — BP 120/80 | HR 100 | Temp 99.3°F | Ht 66.5 in | Wt 231.0 lb

## 2021-08-04 DIAGNOSIS — B373 Candidiasis of vulva and vagina: Secondary | ICD-10-CM

## 2021-08-04 DIAGNOSIS — N390 Urinary tract infection, site not specified: Secondary | ICD-10-CM

## 2021-08-04 DIAGNOSIS — B3731 Acute candidiasis of vulva and vagina: Secondary | ICD-10-CM

## 2021-08-04 LAB — POCT URINALYSIS DIPSTICK
Bilirubin, UA: NEGATIVE
Glucose, UA: NEGATIVE
Ketones, UA: NEGATIVE
Nitrite, UA: NEGATIVE
Protein, UA: NEGATIVE
Spec Grav, UA: 1.02 (ref 1.010–1.025)
Urobilinogen, UA: 0.2 E.U./dL
pH, UA: 5 (ref 5.0–8.0)

## 2021-08-04 MED ORDER — FLUCONAZOLE 150 MG PO TABS: 150.0000 mg | ORAL_TABLET | Freq: Once | ORAL | 0 refills | Status: AC

## 2021-08-04 MED ORDER — NYSTATIN 100000 UNIT/GM EX CREA: 1.0000 "application " | TOPICAL_CREAM | Freq: Two times a day (BID) | CUTANEOUS | 0 refills | Status: DC

## 2021-08-04 MED ORDER — CEPHALEXIN 500 MG PO CAPS: 500.0000 mg | ORAL_CAPSULE | Freq: Three times a day (TID) | ORAL | 0 refills | Status: DC

## 2021-08-04 NOTE — Progress Notes (Signed)
Date:  08/04/2021   Name:  Brenda Campbell   DOB:  1951/01/18   MRN:  [id number]   Chief Complaint: Urinary Tract Infection  Urinary Tract Infection  This is a recurrent problem. The current episode started in the past 7 days. The problem occurs every urination. The problem has been unchanged. The quality of the pain is described as burning. Maximum temperature: 99.3. Associated symptoms include frequency and urgency. Pertinent negatives include no chills, discharge, flank pain, hematuria, hesitancy, nausea, possible pregnancy or vomiting. She has tried antibiotics (helps for a couple of days and then returns) for the symptoms. The treatment provided moderate relief.   Lab Results  Component Value Date   CREATININE 0.66 10/05/2020   BUN 13 10/05/2020   NA 139 10/05/2020   K 4.4 10/05/2020   CL 101 10/05/2020   CO2 25 10/05/2020   Lab Results  Component Value Date   CHOL 201 (H) 02/14/2020   HDL 56 02/14/2020   LDLCALC 121 (H) 02/14/2020   TRIG 138 02/14/2020   CHOLHDL 4.3 10/22/2018   Lab Results  Component Value Date   TSH 2.590 03/23/2021   No results found for: HGBA1C Lab Results  Component Value Date   WBC 5.7 02/14/2020   HGB 14.8 02/14/2020   HCT 42.4 02/14/2020   MCV 98 (H) 02/14/2020   PLT 248 02/14/2020   Lab Results  Component Value Date   ALT 13 (L) 03/15/2017   AST 17 03/15/2017   ALKPHOS 54 03/15/2017   BILITOT 0.5 03/15/2017     Review of Systems  Constitutional:  Negative for chills and fever.  HENT:  Negative for drooling, ear discharge, ear pain and sore throat.   Respiratory:  Negative for cough, shortness of breath and wheezing.   Cardiovascular:  Negative for chest pain, palpitations and leg swelling.  Gastrointestinal:  Negative for abdominal pain, blood in stool, constipation, diarrhea, nausea and vomiting.  Endocrine: Negative for polydipsia.  Genitourinary:  Positive for frequency and urgency. Negative for dysuria, flank pain,  hematuria and hesitancy.  Musculoskeletal:  Negative for back pain, myalgias and neck pain.  Skin:  Negative for rash.  Allergic/Immunologic: Negative for environmental allergies.  Neurological:  Negative for dizziness and headaches.  Hematological:  Does not bruise/bleed easily.  Psychiatric/Behavioral:  Negative for suicidal ideas. The patient is not nervous/anxious.    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   Gastroesophageal reflux disease 12/22/2014   Hypothyroidism 12/22/2014   Obesity 12/22/2014   Tachycardia 12/22/2014   Malaise 12/22/2014    No Known Allergies  Past Surgical History:  Procedure Laterality Date   CESAREAN SECTION     x 1   COLONOSCOPY  10/12/2014   cleared for 3 years   COLONOSCOPY N/A 03/16/2017   Procedure: COLONOSCOPY;  Surgeon: Lucilla Lame, MD;  Location: [facility] ENDOSCOPY;  Service: Endoscopy;  Laterality: N/A;   COLONOSCOPY WITH PROPOFOL N/A 12/01/2017   Procedure: COLONOSCOPY WITH PROPOFOL;  Surgeon: [name], MD;  Location: [facility] ENDOSCOPY;  Service: Gastroenterology;  Laterality: N/A;   COLONOSCOPY WITH PROPOFOL N/A  04/12/2021   Procedure: COLONOSCOPY WITH PROPOFOL;  Surgeon: [name], MD;  Location: [facility] ENDOSCOPY;  Service: Gastroenterology;  Laterality: N/A;   ESOPHAGOGASTRODUODENOSCOPY (EGD) WITH PROPOFOL N/A 03/15/2017   Procedure: ESOPHAGOGASTRODUODENOSCOPY (EGD) WITH PROPOFOL;  Surgeon: [name], MD;  Location: [facility] ENDOSCOPY;  Service: Endoscopy;  Laterality: N/A;   ESOPHAGOGASTRODUODENOSCOPY (EGD)  WITH PROPOFOL N/A 03/30/2017   Procedure: ESOPHAGOGASTRODUODENOSCOPY (EGD) WITH PROPOFOL;  Surgeon: [name], MD;  Location: [facility] ENDOSCOPY;  Service: Endoscopy;  Laterality: N/A;  Endoscopic Capsule Placement   ESOPHAGOGASTRODUODENOSCOPY (EGD) WITH PROPOFOL N/A 12/01/2017   Procedure: ESOPHAGOGASTRODUODENOSCOPY (EGD) WITH PROPOFOL;  Surgeon: [name], MD;  Location: [facility] ENDOSCOPY;  Service: Gastroenterology;  Laterality: N/A;   ESOPHAGOGASTRODUODENOSCOPY (EGD) WITH PROPOFOL N/A 04/12/2021   Procedure: ESOPHAGOGASTRODUODENOSCOPY (EGD) WITH PROPOFOL;  Surgeon: [name], MD;  Location: [facility] ENDOSCOPY;  Service: Gastroenterology;  Laterality: N/A;   GALLBLADDER SURGERY     GIVENS CAPSULE STUDY N/A 03/17/2017   Procedure: GIVENS CAPSULE STUDY;  Surgeon: Lucilla Lame, MD;  Location: [facility] ENDOSCOPY;  Service: Endoscopy;  Laterality: N/A;   GIVENS CAPSULE STUDY N/A 12/01/2017   Procedure: GIVENS CAPSULE STUDY, 12 HOUR;  Surgeon: [name], MD;  Location: [facility] ENDOSCOPY;  Service: Gastroenterology;  Laterality: N/A;   THROAT SURGERY     nodule on vocal chord   TIBIA FRACTURE SURGERY Right    WRIST SURGERY Right    arthritis    Social History   Tobacco Use   Smoking status: Former   Smokeless tobacco: Never  Vaping Use   Vaping Use: Never used  Substance Use Topics   Alcohol use: Yes    Alcohol/week: 9.0 standard drinks    Types: 4 Cans of beer, 5 Shots of liquor per week   Drug use: No     Medication list has been reviewed and updated.  Current Meds  Medication Sig   ALPRAZolam (XANAX) 0.25 MG tablet Take 1 tablet (0.25 mg total) by mouth as needed.   levothyroxine (SYNTHROID) 50 MCG tablet Take 1 tablet (50 mcg total) by mouth daily.   omeprazole (PRILOSEC) 20 MG capsule TAKE 1 CAPSULE BY MOUTH EVERY DAY   sertraline (ZOLOFT) 50 MG tablet Take 1 tablet (50 mg total) by mouth daily.    PHQ 2/9 Scores 08/04/2021 07/08/2021 03/23/2021 01/08/2021  PHQ - 2 Score 0 0 0 0  PHQ- 9  Score 0 0 0 0    GAD 7 : Generalized Anxiety Score 08/04/2021 07/08/2021 03/23/2021 10/05/2020  Nervous, Anxious, on Edge 0 0 0 0  Control/stop worrying 0 0 0 0  Worry too much - different things 0 0 0 0  Trouble relaxing 0 0 0 0  Restless 0 0 0 0  Easily annoyed or irritable 0 0 0 0  Afraid - awful might happen 0 0 0 0  Total GAD 7 Score 0 0 0 0    BP Readings from Last 3 Encounters:  07/08/21 130/84  04/12/21 134/74  03/25/21 116/75    Physical Exam Vitals and nursing note reviewed.  Constitutional:      General: She is not in acute distress.    Appearance: She is not diaphoretic.  HENT:     Head: Normocephalic and atraumatic.     Right Ear: Tympanic membrane and external ear normal.     Left Ear: Tympanic membrane and external ear normal.     Nose: Nose normal.  Eyes:     General:        Right eye: No discharge.  Left eye: No discharge.     Conjunctiva/sclera: Conjunctivae normal.     Pupils: Pupils are equal, round, and reactive to light.  Neck:     Thyroid: No thyromegaly.     Vascular: No JVD.  Cardiovascular:     Rate and Rhythm: Normal rate and regular rhythm.     Heart sounds: Normal heart sounds. No murmur heard.   No friction rub. No gallop.  Pulmonary:     Effort: Pulmonary effort is normal.     Breath sounds: Normal breath sounds. No wheezing or rhonchi.  Abdominal:     General: Bowel sounds are normal.     Palpations: Abdomen is soft. There is no mass.     Tenderness: There is abdominal tenderness in the suprapubic area. There is no right CVA tenderness, left CVA tenderness or guarding.  Musculoskeletal:        General: Normal range of motion.     Cervical back: Normal range of motion and neck supple.  Lymphadenopathy:     Cervical: No cervical adenopathy.  Skin:    General: Skin is warm and dry.  Neurological:     Mental Status: She is alert.     Deep Tendon Reflexes: Reflexes are normal and symmetric.    Wt Readings from Last 3  Encounters:  07/08/21 231 lb (104.8 kg)  04/12/21 229 lb (103.9 kg)  03/25/21 229 lb (103.9 kg)    There were no vitals taken for this visit.  Assessment and Plan:  1. Recurrent UTI Acute.  Recurrence.  Relatively stable now because patient took Macrobid prior to coming.  Patient's been having episodic urinary tract infections over the past couple months.  Patient is having what sounds like overflow incontinence which is suggesting that she may not be in emptying her bladder completely.  Symptoms of dysuria urgency and frequency over the past 48 hours suggest that she has had a recurrence.  And check in her culture from last she grew out Proteus that was resistant to [place] for which she was put on Cipro for 5 days and had excellent results.  With this recurrence I am reluctant to resume the Cipro given its complications and noted that the organism was sensitive to cephalexin so we will prescribe 500 mg 3 times a day for 10 days she will be on antibiotic sufficiently prior to seeing urology.  Patient is also being given candidiasis prophylaxis given the antibiotics and that she is taking a trip to [place]. - POCT urinalysis dipstick - cephALEXin (KEFLEX) 500 MG capsule; Take 1 capsule (500 mg total) by mouth 3 (three) times daily.  Dispense: 30 capsule; Refill: 0  2. Candidiasis of female genitalia As noted above patient is going to be on antibiotics and out of town and we will cover with the Diflucan 150 tablet as well as nystatin cream on a as needed basis. - fluconazole (DIFLUCAN) 150 MG tablet; Take 1 tablet (150 mg total) by mouth once for 1 dose.  Dispense: 1 tablet; Refill: 0 - nystatin cream (MYCOSTATIN); Apply 1 application topically 2 (two) times daily.  Dispense: 30 g; Refill: 0

## 2021-08-04 NOTE — Telephone Encounter (Signed)
Copied from [place] [id number]. Topic: General - Other >> Aug 03, 2021  3:57 PM Valere Dross wrote: Reason for CRM: Pt called in stating she has another UTI, and wants to see about getting some medication to help. Please advise.

## 2021-08-06 NOTE — Telephone Encounter (Signed)
Called and checked in on patient. She was seen 2 days ago- said she is feeling better today.

## 2021-08-12 ENCOUNTER — Encounter: Payer: Self-pay | Admitting: Family Medicine

## 2021-08-12 ENCOUNTER — Other Ambulatory Visit: Payer: Self-pay

## 2021-08-12 DIAGNOSIS — N390 Urinary tract infection, site not specified: Secondary | ICD-10-CM

## 2021-08-13 ENCOUNTER — Other Ambulatory Visit: Payer: Self-pay

## 2021-08-13 ENCOUNTER — Other Ambulatory Visit
Admission: RE | Admit: 2021-08-13 | Discharge: 2021-08-13 | Disposition: A | Discharge status: Home / Self Care | Payer: PPO | Attending: Urology | Admitting: Urology

## 2021-08-13 ENCOUNTER — Encounter: Payer: Self-pay | Admitting: Urology

## 2021-08-13 ENCOUNTER — Ambulatory Visit: Payer: PPO | Admitting: Urology

## 2021-08-13 VITALS — BP 120/72 | HR 75 | Ht 66.5 in | Wt 230.0 lb

## 2021-08-13 DIAGNOSIS — N390 Urinary tract infection, site not specified: Secondary | ICD-10-CM

## 2021-08-13 LAB — URINALYSIS, COMPLETE (UACMP) WITH MICROSCOPIC
Bilirubin Urine: NEGATIVE
Glucose, UA: NEGATIVE mg/dL
Hgb urine dipstick: NEGATIVE
Ketones, ur: NEGATIVE mg/dL
Leukocytes,Ua: NEGATIVE
Nitrite: NEGATIVE
Protein, ur: NEGATIVE mg/dL
Specific Gravity, Urine: 1.025 (ref 1.005–1.030)
pH: 5 (ref 5.0–8.0)

## 2021-08-13 MED ORDER — PREMARIN 0.625 MG/GM VA CREA: TOPICAL_CREAM | VAGINAL | 12 refills | Status: DC

## 2021-08-13 NOTE — Patient Instructions (Signed)
We discussed the following today:  1.  Recommend a prescribed a topical estrogen cream to use on your urethra 3 times a week.  You can use a pea-sized amount which is sufficient.  2.  We like you to start taking cranberry tablets twice a day for UTI prevention  3.  We recommend daily probiotics  4.  If you think you are getting a urinary tract infection, please give [place] a call and we will be happy to see you as needed  5.  We have ordered a renal ultrasound to check your kidneys.  We will call you with these results.

## 2021-08-13 NOTE — Progress Notes (Signed)
08/13/2021 1:15 PM   [employer] 1951/09/21 [id number]  Referring provider: Juline Patch, MD [address] [place],  [place] [address]  Chief Complaint  Patient presents with   Recurrent UTI    HPI: 70 year old female who presents today for further evaluation of recurrent UTIs.  She has been managed appropriately by her PCP, Dr. Otilio Miu for intermittent UTIs.  Most recent documented UTI was in 06/2021 which time she grew Proteus.  She unfortunately also had several ED visits with symptoms of urgency, frequency, dysuria which were treated and improved with antibiotics although no UA/urine culture available.  She had at least 2 of these.  Prior to recently, she had only intermittent infections.  She sometimes drinks cranberry juice but not always.  She is working to lose weight and would like to avoid the sugar.  She is postmenopausal.  She denies any vaginal symptoms.  She is not sexually active.  No issues with constipation or bowel.  No personal history of kidney stones.  No leakage or incontinence.  No gross hematuria.  No flank pain.    PMH: Past Medical History:  Diagnosis Date   Anxiety    Cancer ([place])    small bowel ca   Depression    GERD (gastroesophageal reflux disease)    Hyperlipidemia    Hypertension    Thyroid disease     Surgical History: Past Surgical History:  Procedure Laterality Date   CESAREAN SECTION     x 1   COLONOSCOPY  10/12/2014   cleared for 3 years   COLONOSCOPY N/A 03/16/2017   Procedure: COLONOSCOPY;  Surgeon: Lucilla Lame, MD;  Location: [facility] ENDOSCOPY;  Service: Endoscopy;  Laterality: N/A;   COLONOSCOPY WITH PROPOFOL N/A 12/01/2017   Procedure: COLONOSCOPY WITH PROPOFOL;  Surgeon: [name], MD;  Location: [facility] ENDOSCOPY;  Service: Gastroenterology;  Laterality: N/A;   COLONOSCOPY WITH PROPOFOL N/A 04/12/2021   Procedure: COLONOSCOPY WITH PROPOFOL;  Surgeon: [name], MD;  Location: [facility]  ENDOSCOPY;  Service: Gastroenterology;  Laterality: N/A;   ESOPHAGOGASTRODUODENOSCOPY (EGD) WITH PROPOFOL N/A 03/15/2017   Procedure: ESOPHAGOGASTRODUODENOSCOPY (EGD) WITH PROPOFOL;  Surgeon: [name], MD;  Location: [facility] ENDOSCOPY;  Service: Endoscopy;  Laterality: N/A;   ESOPHAGOGASTRODUODENOSCOPY (EGD) WITH PROPOFOL N/A 03/30/2017   Procedure: ESOPHAGOGASTRODUODENOSCOPY (EGD) WITH PROPOFOL;  Surgeon: [name], MD;  Location: [facility] ENDOSCOPY;  Service: Endoscopy;  Laterality: N/A;  Endoscopic Capsule Placement   ESOPHAGOGASTRODUODENOSCOPY (EGD) WITH PROPOFOL N/A 12/01/2017   Procedure: ESOPHAGOGASTRODUODENOSCOPY (EGD) WITH PROPOFOL;  Surgeon: [name], MD;  [facility] ENDOSCOPY;  Service: Gastroenterology;  Laterality: N/A;   ESOPHAGOGASTRODUODENOSCOPY (EGD) WITH PROPOFOL N/A 04/12/2021   Procedure: ESOPHAGOGASTRODUODENOSCOPY (EGD) WITH PROPOFOL;  Surgeon: [name], MD;  Location: [facility] ENDOSCOPY;  Service: Gastroenterology;  Laterality: N/A;   GALLBLADDER SURGERY     GIVENS CAPSULE STUDY N/A 03/17/2017   Procedure: GIVENS CAPSULE STUDY;  Surgeon: Lucilla Lame, MD;  Location: [facility] ENDOSCOPY;  Service: Endoscopy;  Laterality: N/A;   GIVENS CAPSULE STUDY N/A 12/01/2017   Procedure: GIVENS CAPSULE STUDY, 12 HOUR;  Surgeon: [name], MD;  [facility] ENDOSCOPY;  Service: Gastroenterology;  Laterality: N/A;   THROAT SURGERY     nodule on vocal chord   TIBIA FRACTURE SURGERY Right    WRIST SURGERY Right    arthritis    Home Medications:  Allergies as of 08/13/2021   No Known Allergies      Medication List        Accurate as of  August 13, 2021 11:59 PM. If you have any questions, ask your nurse or doctor.          STOP taking these medications    aspirin EC 81 MG tablet Stopped by: Hollice Espy, MD       TAKE these medications    ALPRAZolam 0.25 MG tablet Commonly known as: XANAX Take 1 tablet (0.25 mg total) by mouth as needed.   cephALEXin 500 MG  capsule Commonly known as: KEFLEX Take 1 capsule (500 mg total) by mouth 3 (three) times daily.   levothyroxine 50 MCG tablet Commonly known as: SYNTHROID Take 1 tablet (50 mcg total) by mouth daily.   nystatin cream Commonly known as: MYCOSTATIN Apply 1 application topically 2 (two) times daily.   omeprazole 20 MG capsule Commonly known as: PRILOSEC TAKE 1 CAPSULE BY MOUTH EVERY DAY   Premarin vaginal cream Generic drug: conjugated estrogens Estrogen Cream Instruction Discard applicator Apply pea sized amount to tip of finger to urethra before bed. Wash hands well after application. Use Monday, Wednesday and Friday Started by: Hollice Espy, MD   sertraline 50 MG tablet Commonly known as: ZOLOFT Take 1 tablet (50 mg total) by mouth daily.        Allergies: No Known Allergies  Family History: Family History  Problem Relation Age of Onset   Diabetes Mother    Heart disease Mother    Breast cancer Neg Hx     Social History:  reports that she has quit smoking. She has never used smokeless tobacco. She reports current alcohol use of about 9.0 standard drinks per week. She reports that she does not use drugs.   Physical Exam: BP 120/72 (BP Location: Left Arm, Patient Position: Sitting, Cuff Size: Large)   Pulse 75   Ht 5' 6.5" (1.689 m)   Wt 230 lb (104.3 kg)   BMI 36.57 kg/m   Constitutional:  Alert and oriented, No acute distress. HEENT: [place] AT, moist mucus membranes.  Trachea midline, no masses. Cardiovascular: No clubbing, cyanosis, or edema. Respiratory: Normal respiratory effort, no increased work of breathing. Skin: No rashes, bruises or suspicious lesions. Neurologic: Grossly intact, no focal deficits, moving all 4 extremities. Psychiatric: Normal mood and affect.  Laboratory Data: Lab Results  Component Value Date   WBC 5.7 02/14/2020   HGB 14.8 02/14/2020   HCT 42.4 02/14/2020   MCV 98 (H) 02/14/2020   PLT 248 02/14/2020    Lab Results   Component Value Date   CREATININE 0.66 10/05/2020     Urinalysis    Component Value Date/Time   COLORURINE YELLOW 08/13/2021 1327   APPEARANCEUR CLEAR 08/13/2021 1327   LABSPEC 1.025 08/13/2021 1327   PHURINE 5.0 08/13/2021 1327   GLUCOSEU NEGATIVE 08/13/2021 1327   HGBUR NEGATIVE 08/13/2021 1327   BILIRUBINUR NEGATIVE 08/13/2021 1327   BILIRUBINUR negative 08/04/2021 1112   KETONESUR NEGATIVE 08/13/2021 1327   PROTEINUR NEGATIVE 08/13/2021 1327   UROBILINOGEN 0.2 08/04/2021 1112   NITRITE NEGATIVE 08/13/2021 1327   LEUKOCYTESUR NEGATIVE 08/13/2021 1327    Lab Results  Component Value Date   BACTERIA FEW (A) 08/13/2021    Assessment & Plan:    1. Recurrent UTI We discussed some behavioral modification/hygiene tips as a relates to recurrent UTIs  In addition to the above, she will implement daily cranberry tablets, probiotics, and consider d-mannose for UTI prevention.  In addition to the above, we discussed the role of topical estrogen cream per urethral meatus as UTI prevention strategies for  postmenopausal management.  We discussed the risk and benefits.  She would like to try this.  In addition to the above, given that her previous urine culture was Proteus, would like to evaluate her upper tracts to ensure that she does not have any stones or upper GU pathology.  She is agreeable this plan.   - Urine Culture; Future - Ultrasound renal complete; Future - conjugated estrogens (PREMARIN) vaginal cream; Estrogen Cream Instruction Discard applicator Apply pea sized amount to tip of finger to urethra before bed. Wash hands well after application. Use Monday, Wednesday and Friday  Dispense: 42.5 g; Refill: 12   Return if symptoms worsen or fail to improve, for Will call w/ U/S results .  Hollice Espy, MD  [facility] [address], [place], [place] [address] [phone]

## 2021-08-15 LAB — URINE CULTURE: Culture: NO GROWTH

## 2021-09-07 ENCOUNTER — Ambulatory Visit (INDEPENDENT_AMBULATORY_CARE_PROVIDER_SITE_OTHER): Payer: PPO | Admitting: Family Medicine

## 2021-09-07 ENCOUNTER — Other Ambulatory Visit: Payer: Self-pay

## 2021-09-07 ENCOUNTER — Encounter: Payer: Self-pay | Admitting: Family Medicine

## 2021-09-07 VITALS — BP 112/62 | HR 60 | Ht 66.5 in | Wt 238.0 lb

## 2021-09-07 DIAGNOSIS — E785 Hyperlipidemia, unspecified: Secondary | ICD-10-CM | POA: Diagnosis not present

## 2021-09-07 DIAGNOSIS — F410 Panic disorder [episodic paroxysmal anxiety] without agoraphobia: Secondary | ICD-10-CM

## 2021-09-07 DIAGNOSIS — A048 Other specified bacterial intestinal infections: Secondary | ICD-10-CM

## 2021-09-07 DIAGNOSIS — F330 Major depressive disorder, recurrent, mild: Secondary | ICD-10-CM | POA: Diagnosis not present

## 2021-09-07 DIAGNOSIS — E039 Hypothyroidism, unspecified: Secondary | ICD-10-CM | POA: Diagnosis not present

## 2021-09-07 DIAGNOSIS — Z23 Encounter for immunization: Secondary | ICD-10-CM

## 2021-09-07 DIAGNOSIS — K2900 Acute gastritis without bleeding: Secondary | ICD-10-CM

## 2021-09-07 MED ORDER — ALPRAZOLAM 0.25 MG PO TABS: 0.2500 mg | ORAL_TABLET | ORAL | 1 refills | Status: DC | PRN

## 2021-09-07 MED ORDER — SERTRALINE HCL 50 MG PO TABS: 50.0000 mg | ORAL_TABLET | Freq: Every day | ORAL | 1 refills | Status: DC

## 2021-09-07 MED ORDER — LEVOTHYROXINE SODIUM 50 MCG PO TABS: 50.0000 ug | ORAL_TABLET | Freq: Every day | ORAL | 1 refills | Status: DC

## 2021-09-07 MED ORDER — OMEPRAZOLE 20 MG PO CPDR: DELAYED_RELEASE_CAPSULE | ORAL | 1 refills | Status: DC

## 2021-09-07 NOTE — Progress Notes (Signed)
Date:  09/07/2021   Name:  Brenda Campbell   DOB:  08/16/1951   MRN:  [id number]   Chief Complaint: Flu Vaccine, Hypothyroidism, Gastroesophageal Reflux, Anxiety, and Depression  Anxiety Presents for follow-up visit. Symptoms include nervous/anxious behavior. Patient reports no chest pain, decreased concentration, depressed mood, dizziness, excessive worry, insomnia, irritability, nausea, palpitations, panic, restlessness, shortness of breath or suicidal ideas.    Depression        This is a chronic problem.  The current episode started more than 1 year ago.   The onset quality is undetermined.   The problem occurs intermittently.The problem is unchanged.  Associated symptoms include indigestion.  Associated symptoms include no decreased concentration, no fatigue, no helplessness, no hopelessness, does not have insomnia, not irritable, no restlessness, no decreased interest, no appetite change, no body aches, no myalgias, no headaches, not sad and no suicidal ideas.  Past treatments include SSRIs - Selective serotonin reuptake inhibitors.  Compliance with treatment is good.  Previous treatment provided moderate relief.  Past medical history includes thyroid problem and anxiety.     Pertinent negatives include , no physical disability and no recent psychiatric admission. Gastroesophageal Reflux She complains of belching. She reports no abdominal pain, no chest pain, no choking, no coughing, no dysphagia, no heartburn, no hoarse voice, no nausea, no sore throat or no wheezing. This is a chronic problem. The current episode started more than 1 year ago. The problem occurs occasionally. The symptoms are aggravated by lying down and certain foods. Pertinent negatives include no fatigue, muscle weakness or weight loss. Risk factors include obesity and lack of exercise. She has tried a PPI for the symptoms. The treatment provided mild relief.  Thyroid Problem Presents for follow-up visit. Symptoms  include anxiety. Patient reports no constipation, depressed mood, diarrhea, dry skin, fatigue, hair loss, hoarse voice, nail problem, palpitations, weight gain or weight loss.   Lab Results  Component Value Date   CREATININE 0.66 10/05/2020   BUN 13 10/05/2020   NA 139 10/05/2020   K 4.4 10/05/2020   CL 101 10/05/2020   CO2 25 10/05/2020   Lab Results  Component Value Date   CHOL 201 (H) 02/14/2020   HDL 56 02/14/2020   LDLCALC 121 (H) 02/14/2020   TRIG 138 02/14/2020   CHOLHDL 4.3 10/22/2018   Lab Results  Component Value Date   TSH 2.590 03/23/2021   No results found for: HGBA1C Lab Results  Component Value Date   WBC 5.7 02/14/2020   HGB 14.8 02/14/2020   HCT 42.4 02/14/2020   MCV 98 (H) 02/14/2020   PLT 248 02/14/2020   Lab Results  Component Value Date   ALT 13 (L) 03/15/2017   AST 17 03/15/2017   ALKPHOS 54 03/15/2017   BILITOT 0.5 03/15/2017     Review of Systems  Constitutional:  Negative for appetite change, chills, fatigue, fever, irritability, weight gain and weight loss.  HENT:  Negative for drooling, ear discharge, ear pain, hoarse voice and sore throat.   Respiratory:  Negative for cough, choking, shortness of breath and wheezing.   Cardiovascular:  Negative for chest pain, palpitations and leg swelling.  Gastrointestinal:  Negative for abdominal pain, blood in stool, constipation, diarrhea, dysphagia, heartburn and nausea.  Endocrine: Negative for polydipsia.  Genitourinary:  Negative for dysuria, frequency, hematuria and urgency.  Musculoskeletal:  Negative for back pain, myalgias, muscle weakness and neck pain.  Skin:  Negative for rash.  Allergic/Immunologic: Negative for environmental allergies.  Neurological:  Negative for dizziness and headaches.  Hematological:  Does not bruise/bleed easily.  Psychiatric/Behavioral:  Positive for depression. Negative for decreased concentration and suicidal ideas. The patient is nervous/anxious. The patient  does not have insomnia.    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   Gastroesophageal reflux disease 12/22/2014   Hypothyroidism 12/22/2014   Obesity 12/22/2014   Tachycardia 12/22/2014   Malaise 12/22/2014    No Known Allergies  Past Surgical History:  Procedure Laterality Date   CESAREAN SECTION     x 1   COLONOSCOPY  10/12/2014   cleared for 3 years   COLONOSCOPY N/A 03/16/2017   Procedure: COLONOSCOPY;  Surgeon: Lucilla Lame, MD;  Location: [facility] ENDOSCOPY;  Service: Endoscopy;  Laterality: N/A;   COLONOSCOPY WITH PROPOFOL N/A 12/01/2017   Procedure: COLONOSCOPY WITH PROPOFOL;  Surgeon: [name], MD;  Location: [facility] ENDOSCOPY;  Service: Gastroenterology;  Laterality: N/A;   COLONOSCOPY WITH PROPOFOL N/A 04/12/2021   Procedure: COLONOSCOPY WITH PROPOFOL;  Surgeon: [name], MD;  Location[facility] ENDOSCOPY;  Service: Gastroenterology;  Laterality: N/A;   ESOPHAGOGASTRODUODENOSCOPY (EGD) WITH PROPOFOL N/A 03/15/2017   Procedure: ESOPHAGOGASTRODUODENOSCOPY (EGD) WITH PROPOFOL;  Surgeon: [name], MD;  Location: [facility] ENDOSCOPY;  Service: Endoscopy;  Laterality: N/A;   ESOPHAGOGASTRODUODENOSCOPY (EGD) WITH PROPOFOL N/A 03/30/2017   Procedure: ESOPHAGOGASTRODUODENOSCOPY (EGD) WITH PROPOFOL;  Surgeon: [name], MD;  Location: [facility] ENDOSCOPY;  Service: Endoscopy;  Laterality: N/A;  Endoscopic Capsule Placement   ESOPHAGOGASTRODUODENOSCOPY (EGD) WITH PROPOFOL N/A 12/01/2017   Procedure: ESOPHAGOGASTRODUODENOSCOPY (EGD) WITH PROPOFOL;  Surgeon: [name], MD;  Location: [facility] ENDOSCOPY;  Service: Gastroenterology;  Laterality: N/A;   ESOPHAGOGASTRODUODENOSCOPY (EGD) WITH PROPOFOL N/A 04/12/2021   Procedure: ESOPHAGOGASTRODUODENOSCOPY (EGD) WITH PROPOFOL;  Surgeon: [name], MD;  Location: [facility] ENDOSCOPY;  Service: Gastroenterology;  Laterality: N/A;   GALLBLADDER SURGERY     GIVENS CAPSULE STUDY N/A 03/17/2017   Procedure: GIVENS CAPSULE STUDY;  Surgeon: Lucilla Lame, MD;  Location: [facility] ENDOSCOPY;  Service: Endoscopy;  Laterality: N/A;   GIVENS CAPSULE STUDY N/A 12/01/2017   Procedure: GIVENS CAPSULE STUDY, 12 HOUR;  Surgeon: [name], MD;  Location: [facility] ENDOSCOPY;  Service: Gastroenterology;  Laterality: N/A;   THROAT SURGERY     nodule on vocal chord   TIBIA FRACTURE SURGERY Right    WRIST SURGERY Right    arthritis    Social History   Tobacco Use   Smoking status: Former   Smokeless tobacco: Never  Vaping Use   Vaping Use: Never used  Substance Use Topics   Alcohol use: Yes    Alcohol/week: 9.0 standard drinks    Types: 4 Cans of beer, 5 Shots of liquor per week   Drug use: No     Medication list has been reviewed and updated.  Current Meds  Medication Sig   ALPRAZolam (XANAX) 0.25 MG tablet Take 1 tablet (0.25 mg total) by mouth as needed.   conjugated estrogens (PREMARIN) vaginal cream Estrogen Cream Instruction Discard applicator Apply pea sized amount to tip of finger to urethra before bed. Wash hands well after application. Use Monday,  Wednesday and Friday (Patient taking differently: Estrogen Cream Instruction Discard applicator Apply pea sized amount to tip of finger to urethra before bed. Wash hands well after application. Use Monday, Wednesday and Friday/ urology)   levothyroxine (SYNTHROID) 50 MCG tablet Take 1 tablet (50 mcg total) by mouth daily.   nystatin cream (MYCOSTATIN) Apply 1 application topically 2 (two) times daily.   omeprazole (PRILOSEC) 20 MG capsule TAKE 1 CAPSULE BY MOUTH EVERY DAY    sertraline (ZOLOFT) 50 MG tablet Take 1 tablet (50 mg total) by mouth daily.    PHQ 2/9 Scores 09/07/2021 08/04/2021 07/08/2021 03/23/2021  PHQ - 2 Score 0 0 0 0  PHQ- 9 Score 1 0 0 0    GAD 7 : Generalized Anxiety Score 09/07/2021 08/04/2021 07/08/2021 03/23/2021  Nervous, Anxious, on Edge 0 0 0 0  Control/stop worrying 0 0 0 0  Worry too much - different things 0 0 0 0  Trouble relaxing 0 0 0 0  Restless 0 0 0 0  Easily annoyed or irritable 0 0 0 0  Afraid - awful might happen 0 0 0 0  Total GAD 7 Score 0 0 0 0    BP Readings from Last 3 Encounters:  09/07/21 112/62  08/13/21 120/72  08/04/21 120/80    Physical Exam Vitals and nursing note reviewed.  Constitutional:      General: She is not irritable.    Appearance: She is well-developed.  HENT:     Head: Normocephalic.     Right Ear: Tympanic membrane and external ear normal. There is no impacted cerumen.     Left Ear: Tympanic membrane and external ear normal. There is no impacted cerumen.     Nose: Nose normal. No congestion.  Eyes:     General: Lids are everted, no foreign bodies appreciated. No scleral icterus.       Left eye: No foreign body or hordeolum.     Conjunctiva/sclera: Conjunctivae normal.     Right eye: Right conjunctiva is not injected.     Left eye: Left conjunctiva is not injected.     Pupils: Pupils are equal, round, and reactive to light.  Neck:     Thyroid: No thyromegaly.     Vascular: No JVD.     Trachea: No tracheal deviation.  Cardiovascular:     Rate and Rhythm: Normal rate and regular rhythm.     Heart sounds: Normal heart sounds. No murmur heard.   No friction rub. No gallop.  Pulmonary:     Effort: Pulmonary effort is normal. No respiratory distress.     Breath sounds: Normal breath sounds. No wheezing, rhonchi or rales.  Abdominal:     General: Bowel sounds are normal.     Palpations: Abdomen is soft. There is no mass.     Tenderness: There is no abdominal tenderness. There is no  guarding or rebound.  Musculoskeletal:        General: No tenderness. Normal range of motion.     Cervical back: Normal range of motion and neck supple.  Lymphadenopathy:     Cervical: No cervical adenopathy.  Skin:    General: Skin is warm.     Findings: No rash.  Neurological:     Mental Status: She is alert and oriented to person, place, and time.     Cranial Nerves: No cranial nerve deficit.     Deep Tendon Reflexes: Reflexes normal.  Psychiatric:        Mood and  Affect: Mood is not anxious or depressed.    Wt Readings from Last 3 Encounters:  09/07/21 238 lb (108 kg)  08/13/21 230 lb (104.3 kg)  08/04/21 231 lb (104.8 kg)    BP 112/62   Pulse 60   Ht 5' 6.5" (1.689 m)   Wt 238 lb (108 kg)   BMI 37.84 kg/m   Assessment and Plan:  1. 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  Controlled.  Stable.  PHQ is 1 Gad score is 0.  Continue sertraline 50 mg once a day. - sertraline (ZOLOFT) 50 MG tablet; Take 1 tablet (50 mg total) by mouth daily.  Dispense: 90 tablet; Refill: 1  2. Panic disorder Chronic.  Controlled.  Stable.  Gad score is 0 but occasional panic attack requiring a Xanax 0.25 is necessitated. - ALPRAZolam (XANAX) 0.25 MG tablet; Take 1 tablet (0.25 mg total) by mouth as needed.  Dispense: 30 tablet; Refill: 1  3. Hypothyroidism, unspecified type Chronic.  Controlled.  Stable.  We will check TSH and if in sufficient limits will continue levothyroxine 50 mcg daily. - levothyroxine (SYNTHROID) 50 MCG tablet; Take 1 tablet (50 mcg total) by mouth daily.  Dispense: 90 tablet; Refill: 1 - TSH - Renal Function Panel  4. H. pylori infection Noted that this is resolved but patient is taking omeprazole twice a day. - omeprazole (PRILOSEC) 20 MG capsule; TAKE 1 CAPSULE BY MOUTH twice a day  Dispense: 180 capsule; Refill: 1  5. Acute gastritis without hemorrhage, unspecified gastritis type Patient has a history of an acute gastritis  secondary most likely to take Goody powders which she is no longer taking.  We will check a hemoglobin to see current status. - Hemoglobin  6. Need for immunization against influenza Discussed and administered - Flu Vaccine QUAD High Dose(Fluad)  7. Hyperlipidemia, unspecified hyperlipidemia type Review of patient's lipids with mild elevation of LDL and triglycerides and decreased HDL in the past we will check current lipid panel for status of levels.. - Lipid Panel With LDL/HDL Ratio

## 2021-09-08 LAB — LIPID PANEL WITH LDL/HDL RATIO
Cholesterol, Total: 236 mg/dL — ABNORMAL HIGH (ref 100–199)
HDL: 64 mg/dL (ref >39)
LDL Chol Calc (NIH): 146 mg/dL — ABNORMAL HIGH (ref 0–99)
LDL/HDL Ratio: 2.3 ratio (ref 0.0–3.2)
Triglycerides: 149 mg/dL (ref 0–149)
VLDL Cholesterol Cal: 26 mg/dL (ref 5–40)

## 2021-09-08 LAB — RENAL FUNCTION PANEL
Albumin: 3.9 g/dL (ref 3.8–4.8)
BUN/Creatinine Ratio: 15 (ref 12–28)
BUN: 10 mg/dL (ref 8–27)
CO2: 24 mmol/L (ref 20–29)
Calcium: 8.6 mg/dL — ABNORMAL LOW (ref 8.7–10.3)
Chloride: 102 mmol/L (ref 96–106)
Creatinine, Ser: 0.65 mg/dL (ref 0.57–1.00)
Glucose: 104 mg/dL — ABNORMAL HIGH (ref 70–99)
Phosphorus: 3.3 mg/dL (ref 3.0–4.3)
Potassium: 3.9 mmol/L (ref 3.5–5.2)
Sodium: 140 mmol/L (ref 134–144)
eGFR: 95 mL/min/{1.73_m2} (ref >59)

## 2021-09-08 LAB — HEMOGLOBIN: Hemoglobin: 14.5 g/dL (ref 11.1–15.9)

## 2021-09-08 LAB — TSH: TSH: 2.92 u[IU]/mL (ref 0.450–4.500)

## 2021-09-27 ENCOUNTER — Ambulatory Visit
Admission: RE | Admit: 2021-09-27 | Discharge: 2021-09-27 | Disposition: A | Discharge status: Home / Self Care | Payer: PPO | Source: Ambulatory Visit | Attending: Urology | Admitting: Urology

## 2021-09-27 ENCOUNTER — Other Ambulatory Visit: Payer: Self-pay

## 2021-09-27 DIAGNOSIS — N390 Urinary tract infection, site not specified: Secondary | ICD-10-CM | POA: Diagnosis not present

## 2021-09-27 DIAGNOSIS — N281 Cyst of kidney, acquired: Secondary | ICD-10-CM | POA: Diagnosis not present

## 2021-09-29 ENCOUNTER — Telehealth: Payer: Self-pay | Admitting: *Deleted

## 2021-09-29 DIAGNOSIS — N390 Urinary tract infection, site not specified: Secondary | ICD-10-CM

## 2021-09-29 NOTE — Telephone Encounter (Addendum)
Patient informed-Order placed for [facility]-Voiced understanding.  ----- Message from Hollice Espy, MD sent at 09/29/2021 11:41 AM EDT ----- There are some shadows in the kidneys on both sides which may represent either stones or possibly fat in the kidney, it is difficult to tell from this particular ultrasound.  I like her to get a KUB to assess for calcifications.  If these are stones, treating these may be helpful in the setting of Proteus UTIs to help rid her of infections.  We will plan on calling her once we get the x-ray results.  Hollice Espy, MD

## 2021-10-04 ENCOUNTER — Ambulatory Visit: Payer: PPO

## 2021-10-11 ENCOUNTER — Ambulatory Visit
Admission: RE | Admit: 2021-10-11 | Discharge: 2021-10-11 | Disposition: A | Discharge status: Home / Self Care | Payer: PPO | Attending: Urology | Admitting: Urology

## 2021-10-11 ENCOUNTER — Ambulatory Visit
Admission: RE | Admit: 2021-10-11 | Discharge: 2021-10-11 | Disposition: A | Discharge status: Home / Self Care | Payer: PPO | Source: Ambulatory Visit | Attending: Urology | Admitting: Urology

## 2021-10-11 DIAGNOSIS — N200 Calculus of kidney: Secondary | ICD-10-CM | POA: Diagnosis not present

## 2021-10-11 DIAGNOSIS — N390 Urinary tract infection, site not specified: Secondary | ICD-10-CM

## 2021-10-11 DIAGNOSIS — Z9049 Acquired absence of other specified parts of digestive tract: Secondary | ICD-10-CM | POA: Diagnosis not present

## 2021-10-13 ENCOUNTER — Telehealth: Payer: Self-pay | Admitting: *Deleted

## 2021-10-13 NOTE — Telephone Encounter (Addendum)
Patient advised, voiced understanding.   ----- Message from Hollice Espy, MD sent at 10/12/2021  8:07 AM EDT ----- There are bilateral stones but they are tiny.  I would not worry about these.    Hollice Espy, MD

## 2021-10-20 ENCOUNTER — Ambulatory Visit (INDEPENDENT_AMBULATORY_CARE_PROVIDER_SITE_OTHER): Payer: PPO

## 2021-10-20 ENCOUNTER — Other Ambulatory Visit: Payer: Self-pay

## 2021-10-20 VITALS — BP 140/76 | HR 73 | Temp 98.2°F | Resp 20 | Ht 67.0 in | Wt 241.4 lb

## 2021-10-20 DIAGNOSIS — Z0000 Encounter for general adult medical examination without abnormal findings: Secondary | ICD-10-CM | POA: Diagnosis not present

## 2021-10-20 NOTE — Progress Notes (Signed)
Subjective:   Brenda Campbell is a 70 y.o. female who presents for Medicare Annual (Subsequent) preventive examination.  Review of Systems     Cardiac Risk Factors include: advanced age (>108men, >75 women)     Objective:    Today's Vitals   10/20/21 1327  BP: 140/76  Pulse: 73  Resp: 20  Temp: 98.2 F (36.8 C)  TempSrc: Oral  SpO2: 97%  Weight: 241 lb 6.4 oz (109.5 kg)  Height: 5\' 7"  (1.702 m)   Body mass index is 37.81 kg/m.  Advanced Directives 10/20/2021 04/12/2021 09/30/2020 01/27/2020 12/09/2017 03/15/2017 03/15/2017  Does Patient Have a Medical Advance Directive? No No No No No No No  Would patient like information on creating a medical advance directive? No - Patient declined - Yes (MAU/Ambulatory/Procedural Areas - Information given) No - Patient declined - No - Patient declined No - Patient declined    Current Medications (verified) Outpatient Encounter Medications as of 10/20/2021  Medication Sig   ALPRAZolam (XANAX) 0.25 MG tablet Take 1 tablet (0.25 mg total) by mouth as needed.   conjugated estrogens (PREMARIN) vaginal cream Estrogen Cream Instruction Discard applicator Apply pea sized amount to tip of finger to urethra before bed. Wash hands well after application. Use Monday, Wednesday and Friday (Patient taking differently: Estrogen Cream Instruction Discard applicator Apply pea sized amount to tip of finger to urethra before bed. Wash hands well after application. Use Monday, Wednesday and Friday/ urology)   levothyroxine (SYNTHROID) 50 MCG tablet Take 1 tablet (50 mcg total) by mouth daily.   nystatin cream (MYCOSTATIN) Apply 1 application topically 2 (two) times daily.   omeprazole (PRILOSEC) 20 MG capsule TAKE 1 CAPSULE BY MOUTH twice a day   sertraline (ZOLOFT) 50 MG tablet Take 1 tablet (50 mg total) by mouth daily.   No facility-administered encounter medications on file as of 10/20/2021.    Allergies (verified) Patient has no known allergies.    History: Past Medical History:  Diagnosis Date   Anxiety    Arthritis    Blood transfusion without reported diagnosis    Cancer ([place])    small bowel ca   Depression    GERD (gastroesophageal reflux disease)    Hyperlipidemia    Hypertension    Thyroid disease    Past Surgical History:  Procedure Laterality Date   CESAREAN SECTION     x 1   CHOLECYSTECTOMY     COLONOSCOPY  10/12/2014   cleared for 3 years   COLONOSCOPY N/A 03/16/2017   Procedure: COLONOSCOPY;  Surgeon: Lucilla Lame, MD;  Location: [facility] ENDOSCOPY;  Service: Endoscopy;  Laterality: N/A;   COLONOSCOPY WITH PROPOFOL N/A 12/01/2017   Procedure: COLONOSCOPY WITH PROPOFOL;  Surgeon: [name], MD;  Location[facility] ENDOSCOPY;  Service: Gastroenterology;  Laterality: N/A;   COLONOSCOPY WITH PROPOFOL N/A 04/12/2021   Procedure: COLONOSCOPY WITH PROPOFOL;  Surgeon: [name], MD;  Location: [facility] ENDOSCOPY;  Service: Gastroenterology;  Laterality: N/A;   ESOPHAGOGASTRODUODENOSCOPY (EGD) WITH PROPOFOL N/A 03/15/2017   Procedure: ESOPHAGOGASTRODUODENOSCOPY (EGD) WITH PROPOFOL;  Surgeon: [name], MD;  Location: [facility] ENDOSCOPY;  Service: Endoscopy;  Laterality: N/A;   ESOPHAGOGASTRODUODENOSCOPY (EGD) WITH PROPOFOL N/A 03/30/2017   Procedure: ESOPHAGOGASTRODUODENOSCOPY (EGD) WITH PROPOFOL;  Surgeon: [name], MD;  Location: [facility] ENDOSCOPY;  Service: Endoscopy;  Laterality: N/A;  Endoscopic Capsule Placement   ESOPHAGOGASTRODUODENOSCOPY (EGD) WITH PROPOFOL N/A 12/01/2017   Procedure: ESOPHAGOGASTRODUODENOSCOPY (EGD) WITH PROPOFOL;  Surgeon: [name], MD;  Location[facility] ENDOSCOPY;  Service: Gastroenterology;  Laterality:  N/A;   ESOPHAGOGASTRODUODENOSCOPY (EGD) WITH PROPOFOL N/A 04/12/2021   Procedure: ESOPHAGOGASTRODUODENOSCOPY (EGD) WITH PROPOFOL;  Surgeon: [name], MD;  Location: [facility] ENDOSCOPY;  Service: Gastroenterology;  Laterality: N/A;   FRACTURE SURGERY     GALLBLADDER SURGERY     GIVENS CAPSULE STUDY N/A  03/17/2017   Procedure: GIVENS CAPSULE STUDY;  Surgeon: Lucilla Lame, MD;  Location: [facility] ENDOSCOPY;  Service: Endoscopy;  Laterality: N/A;   GIVENS CAPSULE STUDY N/A 12/01/2017   Procedure: GIVENS CAPSULE STUDY, 12 HOUR;  Surgeon: [name], MD;  Location: [facility] ENDOSCOPY;  Service: Gastroenterology;  Laterality: N/A;   SMALL INTESTINE SURGERY     THROAT SURGERY     nodule on vocal chord   TIBIA FRACTURE SURGERY Right    WRIST SURGERY Right    arthritis   Family History  Problem Relation Age of Onset   Diabetes Mother    Heart disease Mother    Diabetes Brother    Breast cancer Neg Hx    Social History   Socioeconomic History   Marital status: Married    Spouse name: Not on file   Number of children: Not on file   Years of education: Not on file   Highest education level: Not on file  Occupational History   Not on file  Tobacco Use   Smoking status: Former   Smokeless tobacco: Never  Vaping Use   Vaping Use: Never used  Substance and Sexual Activity   Alcohol use: Yes    Alcohol/week: 9.0 standard drinks    Types: 4 Cans of beer, 5 Shots of liquor per week   Drug use: No   Sexual activity: Not Currently  Other Topics Concern   Not on file  Social History Narrative   Not on file   Social Determinants of Health   Financial Resource Strain: Low Risk    Difficulty of Paying Living Expenses: Not hard at all  Food Insecurity: No Food Insecurity   Worried About Charity fundraiser in the Last Year: Never true   Ran Out of Food in the Last Year: Never true  Transportation Needs: No Transportation Needs   Lack of Transportation (Medical): No   Lack of Transportation (Non-Medical): No  Physical Activity: Insufficiently Active   Days of Exercise per Week: 2 days   Minutes of Exercise per Session: 30 min  Stress: No Stress Concern Present   Feeling of Stress : Not at all  Social Connections: Moderately Integrated   Frequency of Communication with Friends and Family:  More than three times a week   Frequency of Social Gatherings with Friends and Family: Once a week   Attends Religious Services: Never   Marine scientist or Organizations: Yes   Attends Music therapist: 1 to 4 times per year   Marital Status: Married    Tobacco Counseling Counseling given: Not Answered   Clinical Intake:  Pre-visit preparation completed: Yes  Pain : No/denies pain     Nutritional Status: BMI > 30  Obese Nutritional Risks: None Diabetes: No  How often do you need to have someone help you when you read instructions, pamphlets, or other written materials from your doctor or pharmacy?: 1 - Never  Interpreter Needed?: Yes  Information entered by :: Kirke Shaggy, LPN   Activities of Daily Living In your present state of health, do you have any difficulty performing the following activities: 10/20/2021  Hearing? N  Vision? N  Difficulty concentrating or making decisions?  N  Walking or climbing stairs? N  Dressing or bathing? N  Doing errands, shopping? N  Preparing Food and eating ? N  Using the Toilet? N  In the past six months, have you accidently leaked urine? N  Do you have problems with loss of bowel control? N  Managing your Medications? N  Managing your Finances? N  Housekeeping or managing your Housekeeping? N  Some recent data might be hidden    Patient Care Team: Juline Patch, MD as PCP - General (Family Medicine)  Indicate any recent Medical Services you may have received from other than [facility] providers in the past year (date may be approximate).     Assessment:   This is a routine wellness examination for Brenda.  Hearing/Vision screen Hearing Screening - Comments:: &quot;Not as good as it used to be but still hear fine&quot; Vision Screening - Comments:: Goes to [facility]  Dietary issues and exercise activities discussed: Current Exercise Habits: Structured exercise class, Type of exercise: Other -  see comments (swimming at [employer]), Time (Minutes): 30, Frequency (Times/Week): 2, Weekly Exercise (Minutes/Week): 60, Intensity: Mild   Goals Addressed             This Visit's Progress    Weight (lb) < 200 lb (90.7 kg)   241 lb 6.4 oz (109.5 kg)    Lose weight       Depression Screen PHQ 2/9 Scores 10/20/2021 09/07/2021 08/04/2021 07/08/2021 03/23/2021 01/08/2021 10/05/2020  PHQ - 2 Score 0 0 0 0 0 0 0  PHQ- 9 Score - 1 0 0 0 0 0    Fall Risk Fall Risk  10/20/2021 08/04/2021 10/05/2020 09/30/2020 02/14/2020  Falls in the past year? 1 0 0 1 1  Number falls in past yr: 0 0 - 0 0  Injury with Fall? 1 0 - 1 1  Risk for fall due to : History of fall(s) No Fall Risks - History of fall(s) -  Follow up Falls prevention discussed Falls evaluation completed Falls evaluation completed Falls prevention discussed Falls evaluation completed    [place]:  Any stairs in or around the home? Yes  If so, are there any without handrails? Yes  Home free of loose throw rugs in walkways, pet beds, electrical cords, etc? Yes  Adequate lighting in your home to reduce risk of falls? Yes   ASSISTIVE DEVICES UTILIZED TO PREVENT FALLS:  Life alert? No  Use of a cane, walker or w/c? No  Grab bars in the bathroom? No  Shower chair or bench in shower? No  Elevated toilet seat or a handicapped toilet? Yes   TIMED UP AND GO:  Was the test performed? Yes .  Length of time to ambulate 10 feet: 5 sec.   Gait slow and steady without use of assistive device  Cognitive Function:Normal cognitive status assessed by direct observation by this Nurse Health Advisor. No abnormalities found.       6CIT Screen 09/30/2020  What Year? 0 points  What month? 0 points  What time? 0 points  Count back from 20 0 points  Months in reverse 0 points  Repeat phrase 0 points  Total Score 0    Immunizations Immunization History  Administered Date(s) Administered   Fluad  Quad(high Dose 65+) 11/19/2019, 10/05/2020, 09/07/2021   Influenza, High Dose Seasonal PF 10/14/2018   Influenza-Unspecified 09/12/2015, 10/14/2018   Moderna Sars-Covid-2 Vaccination 01/23/2020, 02/20/2020   PNEUMOCOCCAL CONJUGATE-20 03/23/2021  Pneumococcal Conjugate-13 12/30/2015, 10/14/2018   Pneumococcal Polysaccharide-23 12/30/2015, 12/30/2015   Tdap 12/21/2013   Zoster Recombinat (Shingrix) 10/14/2018    TDAP status: Up to dateup to date   Flu Vaccine status: Up to date  Pneumococcal vaccine status: Up to date  Covid-19 vaccine status: Completed vaccines  Qualifies for Shingles Vaccine? Yes   Zostavax completed No   Shingrix Completed?: No.    Education has been provided regarding the importance of this vaccine. Patient has been advised to call insurance company to determine out of pocket expense if they have not yet received this vaccine. Advised may also receive vaccine at local pharmacy or Health Dept. Verbalized acceptance and understanding.  Screening Tests Health Maintenance  Topic Date Due   Zoster Vaccines- Shingrix (2 of 2) 12/07/2021 (Originally 12/09/2018)   Hepatitis C Screening  03/23/2022 (Originally 12/24/1968)   MAMMOGRAM  11/19/2022   TETANUS/TDAP  12/22/2023   COLONOSCOPY (Pts 45-31yrs Insurance coverage will need to be confirmed)  04/12/2024   Pneumonia Vaccine 41+ Years old  Completed   INFLUENZA VACCINE  Completed   DEXA SCAN  Completed   HPV VACCINES  Aged Out   COVID-19 Vaccine  Discontinued    Health Maintenance  There are no preventive care reminders to display for this patient.  Colorectal cancer screening: Type of screening: Colonoscopy. Completed 04/12/2021. Repeat every 3 years  Mammogram status: Completed 11/19/1920. Repeat every year  Bone Density status: Completed 11/19/2020. Results reflect: Bone density results: OSTEOPENIA. Repeat every 2 years.  Lung Cancer Screening: (Low Dose CT Chest recommended if Age 17-80 years, 30 pack-year  currently smoking OR have quit w/in 15years.) does not qualify.   Additional Screening:  Hepatitis C Screening: does qualify; Completed postponed  Vision Screening: Recommended annual ophthalmology exams for early detection of glaucoma and other disorders of the eye. Is the patient up to date with their annual eye exam?  Yes  Who is the provider or what is the name of the office in which the patient attends annual eye exams? [place] Screening: Recommended annual dental exams for proper oral hygiene  Community Resource Referral / Chronic Care Management: CRR required this visit?  No   CCM required this visit?  No      Plan:     I have personally reviewed and noted the following in the patient's chart:   Medical and social history Use of alcohol, tobacco or illicit drugs  Current medications and supplements including opioid prescriptions.  Functional ability and status Nutritional status Physical activity Advanced directives List of other physicians Hospitalizations, surgeries, and ER visits in previous 12 months Vitals Screenings to include cognitive, depression, and falls Referrals and appointments  In addition, I have reviewed and discussed with patient certain preventive protocols, quality metrics, and best practice recommendations. A written personalized care plan for preventive services as well as general preventive health recommendations were provided to patient.     Dionisio David   10/20/2021   Nurse Notes: none

## 2021-10-20 NOTE — Patient Instructions (Signed)
Brenda Campbell , Thank you for taking time to come for your Medicare Wellness Visit. I appreciate your ongoing commitment to your health goals. Please review the following plan we discussed and let me know if I can assist you in the future.   Screening recommendations/referrals: Colonoscopy: 04/12/21 Mammogram: 11/19/20 Bone Density: 11/19/20 Recommended yearly ophthalmology/optometry visit for glaucoma screening and checkup Recommended yearly dental visit for hygiene and checkup  Vaccinations: Influenza vaccine: 09/07/21 Pneumococcal vaccine: 03/23/21 Tdap vaccine: 12/21/13 Shingles vaccine: 10/14/18   Covid-19:01/23/20, 02/20/20  Advanced directives: no, declined paperwork  Conditions/risks identified:   Next appointment: Follow up in one year for your annual wellness visit    Preventive Care 45 Years and Older, Female Preventive care refers to lifestyle choices and visits with your health care provider that can promote health and wellness. What does preventive care include? A yearly physical exam. This is also called an annual well check. Dental exams once or twice a year. Routine eye exams. Ask your health care provider how often you should have your eyes checked. Personal lifestyle choices, including: Daily care of your teeth and gums. Regular physical activity. Eating a healthy diet. Avoiding tobacco and drug use. Limiting alcohol use. Practicing safe sex. Taking low-dose aspirin every day. Taking vitamin and mineral supplements as recommended by your health care provider. What happens during an annual well check? The services and screenings done by your health care provider during your annual well check will depend on your age, overall health, lifestyle risk factors, and family history of disease. Counseling  Your health care provider may ask you questions about your: Alcohol use. Tobacco use. Drug use. Emotional well-being. Home and relationship well-being. Sexual  activity. Eating habits. History of falls. Memory and ability to understand (cognition). Work and work Statistician. Reproductive health. Screening  You may have the following tests or measurements: Height, weight, and BMI. Blood pressure. Lipid and cholesterol levels. These may be checked every 5 years, or more frequently if you are over 36 years old. Skin check. Lung cancer screening. You may have this screening every year starting at age 35 if you have a 30-pack-year history of smoking and currently smoke or have quit within the past 15 years. Fecal occult blood test (FOBT) of the stool. You may have this test every year starting at age 52. Flexible sigmoidoscopy or colonoscopy. You may have a sigmoidoscopy every 5 years or a colonoscopy every 10 years starting at age 26. Hepatitis C blood test. Hepatitis B blood test. Sexually transmitted disease (STD) testing. Diabetes screening. This is done by checking your blood sugar (glucose) after you have not eaten for a while (fasting). You may have this done every 1-3 years. Bone density scan. This is done to screen for osteoporosis. You may have this done starting at age 40. Mammogram. This may be done every 1-2 years. Talk to your health care provider about how often you should have regular mammograms. Talk with your health care provider about your test results, treatment options, and if necessary, the need for more tests. Vaccines  Your health care provider may recommend certain vaccines, such as: Influenza vaccine. This is recommended every year. Tetanus, diphtheria, and acellular pertussis (Tdap, Td) vaccine. You may need a Td booster every 10 years. Zoster vaccine. You may need this after age 93. Pneumococcal 13-valent conjugate (PCV13) vaccine. One dose is recommended after age 70. Pneumococcal polysaccharide (PPSV23) vaccine. One dose is recommended after age 12. Talk to your health care provider about which screenings  and vaccines  you need and how often you need them. This information is not intended to replace advice given to you by your health care provider. Make sure you discuss any questions you have with your health care provider. Document Released: 12/25/2015 Document Revised: 08/17/2016 Document Reviewed: 09/29/2015 Elsevier Interactive Patient Education  2017 [employer] Prevention in the Home Falls can cause injuries. They can happen to people of all ages. There are many things you can do to make your home safe and to help prevent falls. What can I do on the outside of my home? Regularly fix the edges of walkways and driveways and fix any cracks. Remove anything that might make you trip as you walk through a door, such as a raised step or threshold. Trim any bushes or trees on the path to your home. Use bright outdoor lighting. Clear any walking paths of anything that might make someone trip, such as rocks or tools. Regularly check to see if handrails are loose or broken. Make sure that both sides of any steps have handrails. Any raised decks and porches should have guardrails on the edges. Have any leaves, snow, or ice cleared regularly. Use sand or salt on walking paths during winter. Clean up any spills in your garage right away. This includes oil or grease spills. What can I do in the bathroom? Use night lights. Install grab bars by the toilet and in the tub and shower. Do not use towel bars as grab bars. Use non-skid mats or decals in the tub or shower. If you need to sit down in the shower, use a plastic, non-slip stool. Keep the floor dry. Clean up any water that spills on the floor as soon as it happens. Remove soap buildup in the tub or shower regularly. Attach bath mats securely with double-sided non-slip rug tape. Do not have throw rugs and other things on the floor that can make you trip. What can I do in the bedroom? Use night lights. Make sure that you have a light by your bed that  is easy to reach. Do not use any sheets or blankets that are too big for your bed. They should not hang down onto the floor. Have a firm chair that has side arms. You can use this for support while you get dressed. Do not have throw rugs and other things on the floor that can make you trip. What can I do in the kitchen? Clean up any spills right away. Avoid walking on wet floors. Keep items that you use a lot in easy-to-reach places. If you need to reach something above you, use a strong step stool that has a grab bar. Keep electrical cords out of the way. Do not use floor polish or wax that makes floors slippery. If you must use wax, use non-skid floor wax. Do not have throw rugs and other things on the floor that can make you trip. What can I do with my stairs? Do not leave any items on the stairs. Make sure that there are handrails on both sides of the stairs and use them. Fix handrails that are broken or loose. Make sure that handrails are as long as the stairways. Check any carpeting to make sure that it is firmly attached to the stairs. Fix any carpet that is loose or worn. Avoid having throw rugs at the top or bottom of the stairs. If you do have throw rugs, attach them to the floor with carpet tape.  Make sure that you have a light switch at the top of the stairs and the bottom of the stairs. If you do not have them, ask someone to add them for you. What else can I do to help prevent falls? Wear shoes that: Do not have high heels. Have rubber bottoms. Are comfortable and fit you well. Are closed at the toe. Do not wear sandals. If you use a stepladder: Make sure that it is fully opened. Do not climb a closed stepladder. Make sure that both sides of the stepladder are locked into place. Ask someone to hold it for you, if possible. Clearly mark and make sure that you can see: Any grab bars or handrails. First and last steps. Where the edge of each step is. Use tools that help you  move around (mobility aids) if they are needed. These include: Canes. Walkers. Scooters. Crutches. Turn on the lights when you go into a dark area. Replace any light bulbs as soon as they burn out. Set up your furniture so you have a clear path. Avoid moving your furniture around. If any of your floors are uneven, fix them. If there are any pets around you, be aware of where they are. Review your medicines with your doctor. Some medicines can make you feel dizzy. This can increase your chance of falling. Ask your doctor what other things that you can do to help prevent falls. This information is not intended to replace advice given to you by your health care provider. Make sure you discuss any questions you have with your health care provider. Document Released: 09/24/2009 Document Revised: 05/05/2016 Document Reviewed: 01/02/2015 Elsevier Interactive Patient Education  2017 [employer].

## 2021-11-15 ENCOUNTER — Ambulatory Visit: Payer: Self-pay

## 2021-11-15 NOTE — Telephone Encounter (Signed)
Returned pt's call.  Pt has history of frequent Uti's and believes she has one now.  Pt would like to drop -off a sterile urine sample in the morning for testing. Dr. Ronnald Ramp gave pt a sterile container for just this purpose.   No appointments available until Friday.   Reason for Disposition  Urinating more frequently than usual (i.e., frequency)  Answer Assessment - Initial Assessment Questions 1. SYMPTOM: "What's the main symptom you're concerned about?" (e.g., frequency, incontinence)     frequency 2. ONSET: "When did the  frequency  start?"     A few days back 3. PAIN: "Is there any pain?" If Yes, ask: "How bad is it?" (Scale: 1-10; mild, moderate, severe)      4. CAUSE: "What do you think is causing the symptoms?"     UTI 5. OTHER SYMPTOMS: "Do you have any other symptoms?" (e.g., fever, flank pain, blood in urine, pain with urination)     no 6. PREGNANCY: "Is there any chance you are pregnant?" "When was your last menstrual period?"     no  Protocols used: Urinary Symptoms-A-AH

## 2021-11-16 ENCOUNTER — Other Ambulatory Visit: Payer: Self-pay | Admitting: Family Medicine

## 2021-11-16 ENCOUNTER — Ambulatory Visit (INDEPENDENT_AMBULATORY_CARE_PROVIDER_SITE_OTHER): Payer: PPO

## 2021-11-16 ENCOUNTER — Other Ambulatory Visit: Payer: Self-pay

## 2021-11-16 DIAGNOSIS — N390 Urinary tract infection, site not specified: Secondary | ICD-10-CM

## 2021-11-16 LAB — POCT URINALYSIS DIPSTICK
Bilirubin, UA: NEGATIVE
Glucose, UA: NEGATIVE
Ketones, UA: NEGATIVE
Nitrite, UA: NEGATIVE
Protein, UA: NEGATIVE
Spec Grav, UA: 1.02 (ref 1.010–1.025)
Urobilinogen, UA: 0.2 E.U./dL
pH, UA: 6 (ref 5.0–8.0)

## 2021-11-16 NOTE — Progress Notes (Signed)
Ordered culture and u/a done

## 2021-11-18 LAB — URINE CULTURE

## 2021-11-25 ENCOUNTER — Telehealth: Payer: Self-pay | Admitting: Family Medicine

## 2021-11-25 NOTE — Progress Notes (Addendum)
11/26/21 4:30 PM   Brenda Campbell [birth year] [id number]  Referring provider:  Juline Patch, MD [address] [place],  [place] [address] Chief Complaint  Patient presents with   Recurrent UTI     HPI: Brenda Campbell is a 70 y.o.female with a personal history of recurrent UTIs, who presents today for further evaluation of recurrent UTIs.   She has been managed appropriately by her PCP, Dr. Otilio Miu for intermittent UTIs.  She was last seen in clinic on 08/13/2021 for rUTIs her urine culture was negative for growth.  Her most recent documented UTI was on 07/08/2021 and it grew Proteus mirabilis that was nitrofurantoin and tetracycline resistant.   Urine culture on 11/16/2021 was negative for growth.   RUS on 09/27/2021 revealed Echogenic foci in both kidneys without posterior shadowing. These may represent nonobstructing stones versus renal sinus fat.  KUB ON 10/11/2021 revealed 3 punctate calculi measuring up to 2 mm overlying the upper pole the right kidney. A possible 2 mm calculus overlies the medial interpolar region of the left kidney. No urolithiasis.  She reports today that she is had some intermittent burning but sometimes is external.  She is also mostly concerned about urgency and frequency as well as urge incontinence.  She not taking any medications right now for this.  She has been using her topical estrogen cream 3 times a week as well as some cream provided by Dr. Ronnald Ramp for vaginal itching which is helped.  She is a former smoker, quit 26 years ago but smoked "like a chimney" before then.  Results for orders placed or performed in visit on 11/26/21  BLADDER SCAN AMB NON-IMAGING  Result Value Ref Range   Scan Result 0 ml       PMH: Past Medical History:  Diagnosis Date   Anxiety    Arthritis    Blood transfusion without reported diagnosis    Cancer ([place])    small bowel ca   Depression    GERD (gastroesophageal reflux  disease)    Hyperlipidemia    Hypertension    Thyroid disease     Surgical History: Past Surgical History:  Procedure Laterality Date   CESAREAN SECTION     x 1   CHOLECYSTECTOMY     COLONOSCOPY  10/12/2014   cleared for 3 years   COLONOSCOPY N/A 03/16/2017   Procedure: COLONOSCOPY;  Surgeon: Lucilla Lame, MD;  Location: [facility] ENDOSCOPY;  Service: Endoscopy;  Laterality: N/A;   COLONOSCOPY WITH PROPOFOL N/A 12/01/2017   Procedure: COLONOSCOPY WITH PROPOFOL;  Surgeon: [name], MD;  Location: [facility] ENDOSCOPY;  Service: Gastroenterology;  Laterality: N/A;   COLONOSCOPY WITH PROPOFOL N/A 04/12/2021   Procedure: COLONOSCOPY WITH PROPOFOL;  Surgeon: [name], MD;  Location: [facility] ENDOSCOPY;  Service: Gastroenterology;  Laterality: N/A;   ESOPHAGOGASTRODUODENOSCOPY (EGD) WITH PROPOFOL N/A 03/15/2017   Procedure: ESOPHAGOGASTRODUODENOSCOPY (EGD) WITH PROPOFOL;  Surgeon: [name], MD;  Location: [facility] ENDOSCOPY;  Service: Endoscopy;  Laterality: N/A;   ESOPHAGOGASTRODUODENOSCOPY (EGD) WITH PROPOFOL N/A 03/30/2017   Procedure: ESOPHAGOGASTRODUODENOSCOPY (EGD) WITH PROPOFOL;  Surgeon: [name], MD;  Location: [facility] ENDOSCOPY;  Service: Endoscopy;  Laterality: N/A;  Endoscopic Capsule Placement   ESOPHAGOGASTRODUODENOSCOPY (EGD) WITH PROPOFOL N/A 12/01/2017   Procedure: ESOPHAGOGASTRODUODENOSCOPY (EGD) WITH PROPOFOL;  Surgeon: [name], MD;  Location: [facility] ENDOSCOPY;  Service: Gastroenterology;  Laterality: N/A;   ESOPHAGOGASTRODUODENOSCOPY (EGD) WITH PROPOFOL N/A 04/12/2021   Procedure: ESOPHAGOGASTRODUODENOSCOPY (EGD) WITH PROPOFOL;  Surgeon: [name], MD;  Location: [facility]  ENDOSCOPY;  Service: Gastroenterology;  Laterality: N/A;   FRACTURE SURGERY     GALLBLADDER SURGERY     GIVENS CAPSULE STUDY N/A 03/17/2017   Procedure: GIVENS CAPSULE STUDY;  Surgeon: Lucilla Lame, MD;  Location: [facility] ENDOSCOPY;  Service: Endoscopy;  Laterality: N/A;   GIVENS CAPSULE STUDY N/A 12/01/2017    Procedure: GIVENS CAPSULE STUDY, 12 HOUR;  Surgeon: [name], MD;  Location: [facility] ENDOSCOPY;  Service: Gastroenterology;  Laterality: N/A;   SMALL INTESTINE SURGERY     THROAT SURGERY     nodule on vocal chord   TIBIA FRACTURE SURGERY Right    WRIST SURGERY Right    arthritis    Home Medications:  Allergies as of 11/26/2021   No Known Allergies      Medication List        Accurate as of November 26, 2021  4:30 PM. If you have any questions, ask your nurse or doctor.          ALPRAZolam 0.25 MG tablet Commonly known as: XANAX Take 1 tablet (0.25 mg total) by mouth as needed.   levothyroxine 50 MCG tablet Commonly known as: SYNTHROID Take 1 tablet (50 mcg total) by mouth daily.   nystatin cream Commonly known as: MYCOSTATIN Apply 1 application topically 2 (two) times daily.   omeprazole 20 MG capsule Commonly known as: PRILOSEC TAKE 1 CAPSULE BY MOUTH twice a day   Premarin vaginal cream Generic drug: conjugated estrogens Estrogen Cream Instruction Discard applicator Apply pea sized amount to tip of finger to urethra before bed. Wash hands well after application. Use Monday, Wednesday and Friday What changed: additional instructions   sertraline 50 MG tablet Commonly known as: ZOLOFT Take 1 tablet (50 mg total) by mouth daily.        Allergies: No Known Allergies  Family History: Family History  Problem Relation Age of Onset   Diabetes Mother    Heart disease Mother    Diabetes Brother    Breast cancer Neg Hx     Social History:  reports that she has quit smoking. She has never used smokeless tobacco. She reports current alcohol use of about 9.0 standard drinks per week. She reports that she does not use drugs.   Physical Exam: BP (!) 145/83    Pulse 70    Ht 5\' 7"  (1.702 m)    Wt 240 lb (108.9 kg)    BMI 37.59 kg/m   Constitutional:  Alert and oriented, No acute distress. HEENT: [place] AT, moist mucus membranes.  Trachea midline, no  masses. Cardiovascular: No clubbing, cyanosis, or edema. Respiratory: Normal respiratory effort, no increased work of breathing. Skin: No rashes, bruises or suspicious lesions. Neurologic: Grossly intact, no focal deficits, moving all 4 extremities. Psychiatric: Normal mood and affect.  Laboratory Data:  Lab Results  Component Value Date   CREATININE 0.65 09/07/2021    Urinalysis pending   Assessment & Plan:    1. Recurrent UTI Continue topical estrogen cream  Current symptoms are not related to UTIs, rather #2 #3 which does warrant further evaluation especially given her personal history of smoking - BLADDER SCAN AMB NON-IMAGING  2. Dysuria Will evaluate with cystoscopy to which she is agreeable  No evidence of UTI as contributing factor for this, may be external irritation versus underlying bladder pathology  3. Urge incontinence Increasing issues with urinary urgency and urge incontinence  She was given samples of Gemtesa 75 mg x 1 month to try to day until her next  visit for follow-up cystoscopy.  She will let us know how this goes.  F/u Cystoscopy  [facility] [address], [place], [place] [address] [phone]

## 2021-11-25 NOTE — Telephone Encounter (Signed)
Copied from [place] [id number]. Topic: General - Other >> Nov 25, 2021  2:50 PM Wynetta Emery, [place] C wrote: Reason for CRM: pt called in to speak back with Baxter Flattery. She says that they spoke earlier today, pt would like to be clear on what was discussed.   Pt would like for Baxter Flattery to give her a call back whenever possible.

## 2021-11-26 ENCOUNTER — Other Ambulatory Visit: Payer: Self-pay | Admitting: *Deleted

## 2021-11-26 ENCOUNTER — Encounter: Payer: Self-pay | Admitting: Urology

## 2021-11-26 ENCOUNTER — Other Ambulatory Visit: Payer: Self-pay

## 2021-11-26 ENCOUNTER — Other Ambulatory Visit
Admission: RE | Admit: 2021-11-26 | Discharge: 2021-11-26 | Disposition: A | Discharge status: Home / Self Care | Payer: PPO | Attending: Urology | Admitting: Urology

## 2021-11-26 ENCOUNTER — Ambulatory Visit: Payer: PPO | Admitting: Urology

## 2021-11-26 VITALS — BP 145/83 | HR 70 | Ht 67.0 in | Wt 240.0 lb

## 2021-11-26 DIAGNOSIS — R300 Dysuria: Secondary | ICD-10-CM | POA: Diagnosis not present

## 2021-11-26 DIAGNOSIS — N390 Urinary tract infection, site not specified: Secondary | ICD-10-CM

## 2021-11-26 DIAGNOSIS — N3941 Urge incontinence: Secondary | ICD-10-CM | POA: Diagnosis not present

## 2021-11-26 LAB — URINALYSIS, COMPLETE (UACMP) WITH MICROSCOPIC
Bilirubin Urine: NEGATIVE
Glucose, UA: NEGATIVE mg/dL
Nitrite: NEGATIVE
Protein, ur: NEGATIVE mg/dL
Specific Gravity, Urine: 1.025 (ref 1.005–1.030)
WBC, UA: >50 WBC/hpf (ref 0–5)
pH: 5 (ref 5.0–8.0)

## 2021-11-26 LAB — BLADDER SCAN AMB NON-IMAGING: Scan Result: 0

## 2021-11-26 NOTE — Patient Instructions (Signed)

## 2021-11-29 ENCOUNTER — Encounter: Payer: Self-pay | Admitting: Family Medicine

## 2021-11-29 ENCOUNTER — Telehealth: Payer: Self-pay | Admitting: *Deleted

## 2021-11-29 ENCOUNTER — Other Ambulatory Visit: Payer: Self-pay | Admitting: *Deleted

## 2021-11-29 MED ORDER — SULFAMETHOXAZOLE-TRIMETHOPRIM 800-160 MG PO TABS: 1.0000 | ORAL_TABLET | Freq: Two times a day (BID) | ORAL | 0 refills | Status: AC

## 2021-11-29 NOTE — Telephone Encounter (Signed)
Notified patient as instructed, patient pleased. Rx sent

## 2021-11-29 NOTE — Telephone Encounter (Signed)
-----   Message from Hollice Espy, MD sent at 11/29/2021 12:39 PM EST ----- Urinalysis Friday afternoon actually looked suspicious for infection again.  Unfortunately, we are not able to send a culture as this resulted after clinic had closed.  Lets go ahead and treat her with Bactrim DS twice daily for 5 days as a precaution given her upcoming scheduled cystoscopy.  Hollice Espy, MD

## 2021-12-21 NOTE — Progress Notes (Signed)
12/24/21    HPI: Brenda Campbell is a 71 y.o.female with a personal history of recurrent UTIs, dysuria, and urge incontinence, who presents today for cystoscopy.   She has been managed appropriately by her PCP, Dr. Otilio Miu for intermittent UTIs.  Her most recent documented UTI was on 07/08/2021 and it grew Proteus mirabilis that was nitrofurantoin and tetracycline resistant.    RUS on 09/27/2021 revealed Echogenic foci in both kidneys without posterior shadowing. These may represent nonobstructing stones versus renal sinus fat.   KUB ON 10/11/2021 revealed 3 punctate calculi measuring up to 2 mm overlying the upper pole the right kidney. A possible 2 mm calculus overlies the medial interpolar region of the left kidney. No urolithiasis.   She was scheduled for cystoscopy today but believes she has a UTI.  She reports after finishing her last dose of antibiotics, her symptoms resolved but her urine remains cloudy and she felt like she did not quite resolve the infection all the way.  Over the past week, she has been having increased urgency frequency and dysuria.  Her urine today is grossly positive.  She also feels like she may be passing a kidney stone.  She has some left lower quadrant pain which is quite bothersome to her.  She reports that she had stones in the past and this was consistent with her experience.  Unfortunately today, is also her birthday.  She also mentions that she often will get a vaginitis after completing antibiotics and as such, requesting a refill of her nystatin cream.   Vitals:   12/24/21 1412  BP: (!) 152/84  Pulse: 81   NED. A&Ox3.   No respiratory distress   Abd soft, NT, ND  Results for orders placed or performed during the hospital encounter of 12/24/21  Urinalysis, Complete w Microscopic  Result Value Ref Range   Color, Urine AMBER (A) YELLOW   APPearance CLEAR CLEAR   Specific Gravity, Urine 1.025 1.005 - 1.030   pH 5.5 5.0 -  8.0   Glucose, UA NEGATIVE NEGATIVE mg/dL   Hgb urine dipstick TRACE (A) NEGATIVE   Bilirubin Urine SMALL (A) NEGATIVE   Ketones, ur TRACE (A) NEGATIVE mg/dL   Protein, ur NEGATIVE NEGATIVE mg/dL   Nitrite POSITIVE (A) NEGATIVE   Leukocytes,Ua SMALL (A) NEGATIVE   Squamous Epithelial / LPF 6-10 0 - 5   WBC, UA 21-50 0 - 5 WBC/hpf   RBC / HPF 6-10 0 - 5 RBC/hpf   Bacteria, UA MANY (A) NONE SEEN     Assessment/ Plan:  1. Recurrent UTI/ acute cystitis Urine is frankly infected today, will treat with Bactrim for a week  We will plan to reschedule her cystoscopy for in about 10 days for now Completion of her weeklong course of antibiotics to ensure that she is not reinfected by the time we proceed with cystoscopy.  Warning symptoms reviewed.  She is agreeable this plan.  - Urine Culture; Future  2. LLQ pain Previous KUB did show some nonobstructing renal calculi up to 3 mm  Unclear whether or not she is experiencing a stone episode currently, we will plan for KUB today.  If there is a stone, will prescribe her Flomax and pain meds and managed conservatively as she is nontoxic-appearing today.  She mentions today that she is not able to take anything other than Tylenol which is not effective for her.  She reports that she is a "GI bleeder".  We discussed that it is  reasonable for her to have a one-time dose of ibuprofen, GI bleed was caused by daily Goody powder over the course of several years.  Would not recommend frequent dosing. - Abdomen 1 view (KUB); Future  3. Candidiasis of female genitalia - nystatin cream (MYCOSTATIN); Apply 1 application topically 2 (two) times daily.  Dispense: 30 g; Refill: 0   I,Titus Drone,acting as a scribe for Hollice Espy, MD.,have documented all relevant documentation on the behalf of Hollice Espy, MD,as directed by  Hollice Espy, MD while in the presence of Hollice Espy, MD.  I have reviewed the above documentation for accuracy and  completeness, and I agree with the above.   Hollice Espy, MD

## 2021-12-22 ENCOUNTER — Other Ambulatory Visit: Payer: Self-pay

## 2021-12-22 DIAGNOSIS — N390 Urinary tract infection, site not specified: Secondary | ICD-10-CM

## 2021-12-24 ENCOUNTER — Encounter: Payer: Self-pay | Admitting: Urology

## 2021-12-24 ENCOUNTER — Ambulatory Visit
Admission: RE | Admit: 2021-12-24 | Discharge: 2021-12-24 | Disposition: A | Discharge status: Home / Self Care | Payer: PPO | Source: Ambulatory Visit | Attending: Urology | Admitting: Urology

## 2021-12-24 ENCOUNTER — Other Ambulatory Visit: Payer: Self-pay

## 2021-12-24 ENCOUNTER — Ambulatory Visit
Admission: RE | Admit: 2021-12-24 | Discharge: 2021-12-24 | Disposition: A | Discharge status: Home / Self Care | Payer: PPO | Attending: Urology | Admitting: Urology

## 2021-12-24 ENCOUNTER — Other Ambulatory Visit
Admission: RE | Admit: 2021-12-24 | Discharge: 2021-12-24 | Disposition: A | Discharge status: Home / Self Care | Payer: PPO | Source: Home / Self Care | Attending: Urology | Admitting: Urology

## 2021-12-24 ENCOUNTER — Ambulatory Visit: Payer: PPO | Admitting: Urology

## 2021-12-24 ENCOUNTER — Telehealth: Payer: Self-pay | Admitting: Urology

## 2021-12-24 VITALS — BP 152/84 | HR 81 | Ht 67.0 in | Wt 233.0 lb

## 2021-12-24 DIAGNOSIS — B3731 Acute candidiasis of vulva and vagina: Secondary | ICD-10-CM | POA: Diagnosis not present

## 2021-12-24 DIAGNOSIS — R1032 Left lower quadrant pain: Secondary | ICD-10-CM

## 2021-12-24 DIAGNOSIS — N390 Urinary tract infection, site not specified: Secondary | ICD-10-CM | POA: Insufficient documentation

## 2021-12-24 DIAGNOSIS — R109 Unspecified abdominal pain: Secondary | ICD-10-CM | POA: Diagnosis not present

## 2021-12-24 LAB — URINALYSIS, COMPLETE (UACMP) WITH MICROSCOPIC
Glucose, UA: NEGATIVE mg/dL
Nitrite: POSITIVE — AB
Protein, ur: NEGATIVE mg/dL
Specific Gravity, Urine: 1.025 (ref 1.005–1.030)
pH: 5.5 (ref 5.0–8.0)

## 2021-12-24 MED ORDER — TAMSULOSIN HCL 0.4 MG PO CAPS: 0.4000 mg | ORAL_CAPSULE | Freq: Every day | ORAL | 0 refills | Status: DC

## 2021-12-24 MED ORDER — SULFAMETHOXAZOLE-TRIMETHOPRIM 800-160 MG PO TABS: 1.0000 | ORAL_TABLET | Freq: Two times a day (BID) | ORAL | 0 refills | Status: AC

## 2021-12-24 MED ORDER — NYSTATIN 100000 UNIT/GM EX CREA: 1.0000 "application " | TOPICAL_CREAM | Freq: Two times a day (BID) | CUTANEOUS | 0 refills | Status: DC

## 2021-12-24 MED ORDER — HYDROCODONE-ACETAMINOPHEN 5-325 MG PO TABS: 1.0000 | ORAL_TABLET | Freq: Four times a day (QID) | ORAL | 0 refills | Status: DC | PRN

## 2021-12-24 NOTE — Telephone Encounter (Signed)
Left patient a VM with details asked to return with any questions.

## 2021-12-24 NOTE — Telephone Encounter (Signed)
Please let Ms. Brenda Campbell know that I do not see any ureteral stones on her x-ray today or much change in the stones in her kidneys but since were going into the weekend, I did prescribe her a few tablets of Vicodin as well as Flomax as a precaution.  Please let her know.  Hollice Espy, MD

## 2021-12-27 LAB — URINE CULTURE: Culture: 80000 — AB

## 2022-01-03 NOTE — Progress Notes (Signed)
° °  01/04/22  CC:  Chief Complaint  Patient presents with   Cysto     HPI: Brenda Campbell is a 71 y.o.female with a personal history of  recurrent UTIs, dysuria, and urge incontinence, who presents today for a cystoscopy.   She has been managed appropriately by her PCP, Dr. Otilio Miu for intermittent UTIs.   Her most recent documented UTI was on 07/08/2021 and it grew Proteus mirabilis that was nitrofurantoin and tetracycline resistant.    RUS on 09/27/2021 revealed Echogenic foci in both kidneys without posterior shadowing. These may represent nonobstructing stones versus renal sinus fat.   KUB ON 10/11/2021 revealed 3 punctate calculi measuring up to 2 mm overlying the upper pole the right kidney. A possible 2 mm calculus overlies the medial interpolar region of the left kidney. No urolithiasis.  She was seen in clinic on 12/24/2021. Urine culture  grew Staphylococcus aureus that was Clindamycin and Inducible Clindamycin resistant. Urinalysis with trace blood, trace ketone, positive nitrite, many bacteria, and small leukocytes. She was treated with Bactrim.   KUB on 12/24/2021 visualized stable bilateral renal calculi.   She reports today that her LLQ has gotten better.    Vitals:   01/04/22 1111  BP: 132/75  Pulse: 67  NED. A&Ox3.   No respiratory distress   Abd soft, NT, ND Normal external genitalia with patent urethral meatus  Cystoscopy Procedure Note  Patient identification was confirmed, informed consent was obtained, and patient was prepped using Betadine solution.  Lidocaine jelly was administered per urethral meatus.    Procedure: - Flexible cystoscope introduced, without any difficulty.   - Thorough search of the bladder revealed:    normal urethral meatus    normal urothelium    no stones    no ulcers     no tumors    no urethral polyps    no trabeculation    Mild erythematous at trigone with whitish plaques consistent with squamous  metasplasia  - Ureteral orifices were normal in position and appearance.  Post-Procedure: - Patient tolerated the procedure well  Assessment/ Plan:  Recurrent UTI/acute cystitis  - Cystoscopy was reassuring today; NED - Continue estrogen cream, daily probiotics, cranberry tablets -Discussed the option of suppression, prefer to avoid and she is agreement with this - We discussed suppression antibiotics today if her infections continue without improvement from the estrogen cream.    Return in 1 year   I,Kailey Littlejohn,acting as a scribe for Hollice Espy, MD.,have documented all relevant documentation on the behalf of Hollice Espy, MD,as directed by  Hollice Espy, MD while in the presence of Hollice Espy, MD.  I have reviewed the above documentation for accuracy and completeness, and I agree with the above.   Hollice Espy, MD

## 2022-01-04 ENCOUNTER — Ambulatory Visit: Payer: PPO | Admitting: Urology

## 2022-01-04 ENCOUNTER — Other Ambulatory Visit: Payer: Self-pay

## 2022-01-04 VITALS — BP 132/75 | HR 67 | Ht 67.0 in | Wt 233.0 lb

## 2022-01-04 DIAGNOSIS — N390 Urinary tract infection, site not specified: Secondary | ICD-10-CM

## 2022-01-04 LAB — MICROSCOPIC EXAMINATION

## 2022-01-04 LAB — URINALYSIS, COMPLETE
Bilirubin, UA: NEGATIVE
Glucose, UA: NEGATIVE
Ketones, UA: NEGATIVE
Leukocytes,UA: NEGATIVE
Nitrite, UA: NEGATIVE
Protein,UA: NEGATIVE
RBC, UA: NEGATIVE
Specific Gravity, UA: >1.03 — ABNORMAL HIGH (ref 1.005–1.030)
Urobilinogen, Ur: 0.2 mg/dL (ref 0.2–1.0)
pH, UA: 6 (ref 5.0–7.5)

## 2022-01-17 ENCOUNTER — Other Ambulatory Visit: Payer: Self-pay | Admitting: Urology

## 2022-01-17 DIAGNOSIS — B3731 Acute candidiasis of vulva and vagina: Secondary | ICD-10-CM

## 2022-01-18 ENCOUNTER — Other Ambulatory Visit: Payer: Self-pay | Admitting: Urology

## 2022-01-18 ENCOUNTER — Other Ambulatory Visit: Payer: Self-pay | Admitting: Family Medicine

## 2022-01-18 DIAGNOSIS — A048 Other specified bacterial intestinal infections: Secondary | ICD-10-CM

## 2022-01-18 DIAGNOSIS — F330 Major depressive disorder, recurrent, mild: Secondary | ICD-10-CM

## 2022-01-18 DIAGNOSIS — B3731 Acute candidiasis of vulva and vagina: Secondary | ICD-10-CM

## 2022-01-18 MED ORDER — NYSTATIN 100000 UNIT/GM EX CREA: 1.0000 "application " | TOPICAL_CREAM | Freq: Two times a day (BID) | CUTANEOUS | 0 refills | Status: DC

## 2022-02-02 ENCOUNTER — Other Ambulatory Visit: Payer: Self-pay | Admitting: Family Medicine

## 2022-02-02 DIAGNOSIS — Z1231 Encounter for screening mammogram for malignant neoplasm of breast: Secondary | ICD-10-CM

## 2022-03-07 ENCOUNTER — Other Ambulatory Visit: Payer: Self-pay

## 2022-03-07 ENCOUNTER — Encounter: Payer: Self-pay | Admitting: Family Medicine

## 2022-03-07 ENCOUNTER — Ambulatory Visit (INDEPENDENT_AMBULATORY_CARE_PROVIDER_SITE_OTHER): Payer: PPO | Admitting: Family Medicine

## 2022-03-07 VITALS — BP 100/62 | HR 72 | Ht 67.0 in | Wt 227.0 lb

## 2022-03-07 DIAGNOSIS — E039 Hypothyroidism, unspecified: Secondary | ICD-10-CM

## 2022-03-07 DIAGNOSIS — F410 Panic disorder [episodic paroxysmal anxiety] without agoraphobia: Secondary | ICD-10-CM

## 2022-03-07 DIAGNOSIS — F330 Major depressive disorder, recurrent, mild: Secondary | ICD-10-CM | POA: Diagnosis not present

## 2022-03-07 DIAGNOSIS — K2900 Acute gastritis without bleeding: Secondary | ICD-10-CM | POA: Diagnosis not present

## 2022-03-07 DIAGNOSIS — K219 Gastro-esophageal reflux disease without esophagitis: Secondary | ICD-10-CM

## 2022-03-07 MED ORDER — SERTRALINE HCL 50 MG PO TABS: 50.0000 mg | ORAL_TABLET | Freq: Every day | ORAL | 1 refills | Status: DC

## 2022-03-07 MED ORDER — OMEPRAZOLE 20 MG PO CPDR: 20.0000 mg | DELAYED_RELEASE_CAPSULE | Freq: Two times a day (BID) | ORAL | 1 refills | Status: DC

## 2022-03-07 MED ORDER — ALPRAZOLAM 0.25 MG PO TABS: 0.2500 mg | ORAL_TABLET | ORAL | 1 refills | Status: DC | PRN

## 2022-03-07 MED ORDER — LEVOTHYROXINE SODIUM 50 MCG PO TABS: 50.0000 ug | ORAL_TABLET | Freq: Every day | ORAL | 1 refills | Status: DC

## 2022-03-07 NOTE — Progress Notes (Signed)
? ? ?Date:  03/07/2022  ? ?Name:  Brenda Campbell   DOB:  July 30, 1951   MRN:  [id number] ? ? ?Chief Complaint: Anxiety, Gastroesophageal Reflux, Depression, and Hypothyroidism ? ?Anxiety ?Presents for follow-up visit. Patient reports no chest pain, compulsions, confusion, decreased concentration, depressed mood, dizziness, dry mouth, excessive worry, feeling of choking, hyperventilation, impotence, insomnia, irritability, malaise, muscle tension, nausea, nervous/anxious behavior, obsessions, palpitations, panic, restlessness, shortness of breath or suicidal ideas. Symptoms occur occasionally. The severity of symptoms is mild.  ? ? ?Gastroesophageal Reflux ?She reports no abdominal pain, no belching, no chest pain, no choking, no coughing, no dysphagia, no early satiety, no globus sensation, no heartburn, no hoarse voice, no nausea, no sore throat, no stridor or no wheezing. This is a chronic problem. The current episode started more than 1 year ago. The problem has been gradually improving. The symptoms are aggravated by certain foods. Pertinent negatives include no anemia, fatigue, melena, muscle weakness, orthopnea or weight loss. Risk factors include hiatal hernia. She has tried a PPI for the symptoms. The treatment provided moderate relief.  ?Depression ?       This is a chronic problem.  The onset quality is gradual.   Associated symptoms include no decreased concentration, no fatigue, no helplessness, no hopelessness, does not have insomnia, not irritable, no restlessness, no decreased interest, no appetite change, no body aches, no myalgias, no headaches, no indigestion, not sad and no suicidal ideas.     The symptoms are aggravated by nothing.  Past treatments include SSRIs - Selective serotonin reuptake inhibitors.  Compliance with treatment is variable.  Previous treatment provided moderate relief.  Past medical history includes anxiety.   ? ?Lab Results  ?Component Value Date  ? NA 140 09/07/2021  ? K  3.9 09/07/2021  ? CO2 24 09/07/2021  ? GLUCOSE 104 (H) 09/07/2021  ? BUN 10 09/07/2021  ? CREATININE 0.65 09/07/2021  ? CALCIUM 8.6 (L) 09/07/2021  ? EGFR 95 09/07/2021  ? GFRNONAA 90 10/05/2020  ? ?Lab Results  ?Component Value Date  ? CHOL 236 (H) 09/07/2021  ? HDL 64 09/07/2021  ? LDLCALC 146 (H) 09/07/2021  ? TRIG 149 09/07/2021  ? CHOLHDL 4.3 10/22/2018  ? ?Lab Results  ?Component Value Date  ? TSH 2.920 09/07/2021  ? ?No results found for: HGBA1C ?Lab Results  ?Component Value Date  ? WBC 5.7 02/14/2020  ? HGB 14.5 09/07/2021  ? HCT 42.4 02/14/2020  ? MCV 98 (H) 02/14/2020  ? PLT 248 02/14/2020  ? ?Lab Results  ?Component Value Date  ? ALT 13 (L) 03/15/2017  ? AST 17 03/15/2017  ? ALKPHOS 54 03/15/2017  ? BILITOT 0.5 03/15/2017  ? ?No results found for: 25OHVITD2, [place], VD25OH  ? ?Review of Systems  ?Constitutional:  Negative for appetite change, chills, fatigue, fever, irritability and weight loss.  ?HENT:  Negative for drooling, ear discharge, ear pain, hoarse voice and sore throat.   ?Respiratory:  Negative for cough, choking, shortness of breath and wheezing.   ?Cardiovascular:  Negative for chest pain, palpitations and leg swelling.  ?Gastrointestinal:  Negative for abdominal pain, blood in stool, constipation, diarrhea, dysphagia, heartburn, melena and nausea.  ?Endocrine: Negative for polydipsia.  ?Genitourinary:  Negative for dysuria, frequency, hematuria, impotence and urgency.  ?Musculoskeletal:  Negative for back pain, myalgias, muscle weakness and neck pain.  ?Skin:  Negative for rash.  ?Allergic/Immunologic: Negative for environmental allergies.  ?Neurological:  Negative for dizziness and headaches.  ?Hematological:  Does not bruise/bleed  easily.  ?Psychiatric/Behavioral:  Positive for depression. Negative for confusion, decreased concentration and suicidal ideas. The patient is not nervous/anxious and does not have insomnia.   ? ?Patient Active Problem List  ? Diagnosis Date Noted  ?  Gastrointestinal hemorrhage with melena 06/28/2017  ? Blood in stool   ? Acute GI bleeding   ? Benign neoplasm of transverse colon   ? GIB (gastrointestinal bleeding) 03/15/2017  ? Panic disorder 03/13/2017  ? 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 Mixed hyperlipidemia 12/23/2014  ? Paroxysmal supraventricular tachycardia ([place]) 12/23/2014  ? 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 Gastroesophageal reflux disease 12/22/2014  ? Hypothyroidism 12/22/2014  ? Obesity 12/22/2014  ? Tachycardia 12/22/2014  ? Malaise 12/22/2014  ? ? ?No Known Allergies ? ?Past Surgical History:  ?Procedure Laterality Date  ? CESAREAN SECTION    ? x 1  ? CHOLECYSTECTOMY    ? COLONOSCOPY  10/12/2014  ? cleared for 3 years  ? COLONOSCOPY N/A 03/16/2017  ? Procedure: COLONOSCOPY;  Surgeon: Lucilla Lame, MD;  Location: [facility] ENDOSCOPY;  Service: Endoscopy;  Laterality: N/A;  ? COLONOSCOPY WITH PROPOFOL N/A 12/01/2017  ? Procedure: COLONOSCOPY WITH PROPOFOL;  Surgeon: [name], MD;  Location: [facility] ENDOSCOPY;  Service: Gastroenterology;  Laterality: N/A;  ? COLONOSCOPY WITH PROPOFOL N/A 04/12/2021  ? Procedure: COLONOSCOPY WITH PROPOFOL;  Surgeon: [name], MD;  Location: [facility] ENDOSCOPY;  Service: Gastroenterology;  Laterality: N/A;  ? ESOPHAGOGASTRODUODENOSCOPY (EGD) WITH PROPOFOL N/A 03/15/2017  ? Procedure: ESOPHAGOGASTRODUODENOSCOPY (EGD) WITH PROPOFOL;  Surgeon: [name], MD;  Location: [facility] ENDOSCOPY;  Service: Endoscopy;  Laterality: N/A;  ? ESOPHAGOGASTRODUODENOSCOPY (EGD) WITH PROPOFOL N/A 03/30/2017  ? Procedure: ESOPHAGOGASTRODUODENOSCOPY (EGD) WITH PROPOFOL;  Surgeon: [name], MD;  Location: [facility] ENDOSCOPY;  Service: Endoscopy;  Laterality: N/A;  Endoscopic Capsule Placement  ? ESOPHAGOGASTRODUODENOSCOPY (EGD) WITH PROPOFOL N/A 12/01/2017  ? Procedure: ESOPHAGOGASTRODUODENOSCOPY (EGD) WITH PROPOFOL;  Surgeon: [name], MD;  Location: [facility] ENDOSCOPY;  Service:  Gastroenterology;  Laterality: N/A;  ? ESOPHAGOGASTRODUODENOSCOPY (EGD) WITH PROPOFOL N/A 04/12/2021  ? Procedure: ESOPHAGOGASTRODUODENOSCOPY (EGD) WITH PROPOFOL;  Surgeon: [name], MD;  Location: [facility] ENDOSCOPY;  Service: Gastroenterology;  Laterality: N/A;  ? FRACTURE SURGERY    ? GALLBLADDER SURGERY    ? GIVENS CAPSULE STUDY N/A 03/17/2017  ? Procedure: GIVENS CAPSULE STUDY;  Surgeon: Lucilla Lame, MD;  Location: [facility] ENDOSCOPY;  Service: Endoscopy;  Laterality: N/A;  ? GIVENS CAPSULE STUDY N/A 12/01/2017  ? Procedure: GIVENS CAPSULE STUDY, 12 HOUR;  Surgeon: [name], MD;  Location: [facility] ENDOSCOPY;  Service: Gastroenterology;  Laterality: N/A;  ? SMALL INTESTINE SURGERY    ? THROAT SURGERY    ? nodule on vocal chord  ? TIBIA FRACTURE SURGERY Right   ? WRIST SURGERY Right   ? arthritis  ? ? ?Social History  ? ?Tobacco Use  ? Smoking status: Former  ? Smokeless tobacco: Never  ?Vaping Use  ? Vaping Use: Never used  ?Substance Use Topics  ? Alcohol use: Yes  ?  Alcohol/week: 9.0 standard drinks  ?  Types: 4 Cans of beer, 5 Shots of liquor per week  ? Drug use: No  ? ? ? ?Medication list has been reviewed and updated. ? ?Current Meds  ?Medication Sig  ? ALPRAZolam (XANAX) 0.25 MG tablet Take 1 tablet (0.25 mg total) by mouth as needed.  ? conjugated estrogens (PREMARIN) vaginal cream Estrogen Cream Instruction Discard applicator Apply pea sized amount to tip of finger to urethra before bed. Wash  hands well after application. Use Monday, Wednesday and Friday  ? levothyroxine (SYNTHROID) 50 MCG tablet Take 1 tablet (50 mcg total) by mouth daily.  ? nystatin cream (MYCOSTATIN) Apply 1 application topically 2 (two) times daily.  ? omeprazole (PRILOSEC) 20 MG capsule TAKE 1 CAPSULE BY MOUTH TWICE A DAY  ? sertraline (ZOLOFT) 50 MG tablet TAKE 1 TABLET BY MOUTH EVERY DAY  ? ? ? ?  03/07/2022  ? 10:18 AM 10/20/2021  ?  1:55 PM 09/07/2021  ? 10:06 AM 08/04/2021  ? 10:58 AM  ?PHQ 2/9 Scores  ?PHQ - 2 Score 0 0 0 0  ?PHQ- 9  Score 0  1 0  ? ? ? ?  03/07/2022  ? 10:18 AM 09/07/2021  ? 10:07 AM 08/04/2021  ? 10:58 AM 07/08/2021  ? 10:17 AM  ?GAD 7 : Generalized Anxiety Score  ?Nervous, Anxious, on Edge 0 0 0 0  ?Control/stop worrying 0 0 0

## 2022-03-08 LAB — LIPID PANEL WITH LDL/HDL RATIO
Cholesterol, Total: 209 mg/dL — ABNORMAL HIGH (ref 100–199)
HDL: 68 mg/dL (ref >39)
LDL Chol Calc (NIH): 126 mg/dL — ABNORMAL HIGH (ref 0–99)
LDL/HDL Ratio: 1.9 ratio (ref 0.0–3.2)
Triglycerides: 85 mg/dL (ref 0–149)
VLDL Cholesterol Cal: 15 mg/dL (ref 5–40)

## 2022-03-08 LAB — TSH: TSH: 2.03 u[IU]/mL (ref 0.450–4.500)

## 2022-03-28 ENCOUNTER — Ambulatory Visit
Admission: RE | Admit: 2022-03-28 | Discharge: 2022-03-28 | Disposition: A | Discharge status: Home / Self Care | Payer: PPO | Source: Ambulatory Visit | Attending: Family Medicine | Admitting: Family Medicine

## 2022-03-28 DIAGNOSIS — Z1231 Encounter for screening mammogram for malignant neoplasm of breast: Secondary | ICD-10-CM

## 2022-05-25 DIAGNOSIS — Q8501 Neurofibromatosis, type 1: Secondary | ICD-10-CM | POA: Diagnosis not present

## 2022-05-25 DIAGNOSIS — D3610 Benign neoplasm of peripheral nerves and autonomic nervous system, unspecified: Secondary | ICD-10-CM | POA: Diagnosis not present

## 2022-05-25 DIAGNOSIS — L853 Xerosis cutis: Secondary | ICD-10-CM | POA: Diagnosis not present

## 2022-05-25 DIAGNOSIS — L578 Other skin changes due to chronic exposure to nonionizing radiation: Secondary | ICD-10-CM | POA: Diagnosis not present

## 2022-05-25 DIAGNOSIS — Z872 Personal history of diseases of the skin and subcutaneous tissue: Secondary | ICD-10-CM | POA: Diagnosis not present

## 2022-05-25 DIAGNOSIS — Z86018 Personal history of other benign neoplasm: Secondary | ICD-10-CM | POA: Diagnosis not present

## 2022-07-08 ENCOUNTER — Telehealth: Payer: PPO | Admitting: Physician Assistant

## 2022-07-08 DIAGNOSIS — R3989 Other symptoms and signs involving the genitourinary system: Secondary | ICD-10-CM | POA: Diagnosis not present

## 2022-07-08 MED ORDER — CEPHALEXIN 500 MG PO CAPS: 500.0000 mg | ORAL_CAPSULE | Freq: Two times a day (BID) | ORAL | 0 refills | Status: DC

## 2022-07-08 NOTE — Progress Notes (Signed)

## 2022-07-26 ENCOUNTER — Ambulatory Visit: Payer: PPO | Admitting: Physician Assistant

## 2022-07-26 ENCOUNTER — Encounter: Payer: Self-pay | Admitting: Physician Assistant

## 2022-07-26 VITALS — BP 130/80 | HR 76 | Ht 66.0 in | Wt 210.0 lb

## 2022-07-26 DIAGNOSIS — B3731 Acute candidiasis of vulva and vagina: Secondary | ICD-10-CM | POA: Diagnosis not present

## 2022-07-26 DIAGNOSIS — N390 Urinary tract infection, site not specified: Secondary | ICD-10-CM

## 2022-07-26 LAB — MICROSCOPIC EXAMINATION: WBC, UA: >30 /hpf — AB (ref 0–5)

## 2022-07-26 LAB — URINALYSIS, COMPLETE
Bilirubin, UA: NEGATIVE
Glucose, UA: NEGATIVE
Ketones, UA: NEGATIVE
Nitrite, UA: NEGATIVE
Specific Gravity, UA: 1.025 (ref 1.005–1.030)
Urobilinogen, Ur: 0.2 mg/dL (ref 0.2–1.0)
pH, UA: 5 (ref 5.0–7.5)

## 2022-07-26 MED ORDER — NYSTATIN 100000 UNIT/GM EX CREA: 1.0000 | TOPICAL_CREAM | Freq: Two times a day (BID) | CUTANEOUS | 0 refills | Status: DC

## 2022-07-26 MED ORDER — SULFAMETHOXAZOLE-TRIMETHOPRIM 800-160 MG PO TABS: 1.0000 | ORAL_TABLET | Freq: Two times a day (BID) | ORAL | 0 refills | Status: DC

## 2022-07-26 MED ORDER — SULFAMETHOXAZOLE-TRIMETHOPRIM 800-160 MG PO TABS: 1.0000 | ORAL_TABLET | Freq: Two times a day (BID) | ORAL | 0 refills | Status: AC

## 2022-07-26 NOTE — Progress Notes (Signed)
07/26/2022 2:09 PM   [place] [birth year] [id number]  CC: Chief Complaint  Patient presents with   Recurrent UTI   HPI: Brenda Campbell is a 71 y.o. female with PMH recurrent UTI on vaginal estrogen cream, cranberry supplements, and probiotics and urge incontinence with NED on cystoscopy with Dr. Erlene Quan in January 2023 who presents today for evaluation of possible UTI.   Today she reports an approximate 2 to 3-week history of dysuria, urgency, frequency.  She had an ED visit with her PCP on 07/08/2022 and was started on empiric Keflex for 7 days for UTI, however she states she completed the antibiotic and her symptoms never resolved.  He denies fever, chills, nausea, or vomiting.  She also requests a refill of nystatin cream for management of intermittent vaginal itching.  She has been using vaginal estrogen cream 3 times weekly, cranberry supplements, and probiotics as instructed for UTI prevention.  In-office UA today positive for 2+ blood, 1+ protein, and 3+ leukocytes; urine microscopy with >30 WBCs/HPF, 11-30 RBCs/HPF, and many bacteria.   PMH: Past Medical History:  Diagnosis Date   Anxiety    Arthritis    Blood transfusion without reported diagnosis    Cancer ([place])    small bowel ca   Depression    GERD (gastroesophageal reflux disease)    Hyperlipidemia    Hypertension    Thyroid disease     Surgical History: Past Surgical History:  Procedure Laterality Date   CESAREAN SECTION     x 1   CHOLECYSTECTOMY     COLONOSCOPY  10/12/2014   cleared for 3 years   COLONOSCOPY N/A 03/16/2017   Procedure: COLONOSCOPY;  Surgeon: Lucilla Lame, MD;  Location: [facility] ENDOSCOPY;  Service: Endoscopy;  Laterality: N/A;   COLONOSCOPY WITH PROPOFOL N/A 12/01/2017   Procedure: COLONOSCOPY WITH PROPOFOL;  Surgeon: [name], MD;  Location: [facility] ENDOSCOPY;  Service: Gastroenterology;  Laterality: N/A;   COLONOSCOPY WITH PROPOFOL N/A 04/12/2021   Procedure:  COLONOSCOPY WITH PROPOFOL;  Surgeon: [name], MD;  Location: [facility] ENDOSCOPY;  Service: Gastroenterology;  Laterality: N/A;   ESOPHAGOGASTRODUODENOSCOPY (EGD) WITH PROPOFOL N/A 03/15/2017   Procedure: ESOPHAGOGASTRODUODENOSCOPY (EGD) WITH PROPOFOL;  Surgeon: [name], MD;  Location: [facility] ENDOSCOPY;  Service: Endoscopy;  Laterality: N/A;   ESOPHAGOGASTRODUODENOSCOPY (EGD) WITH PROPOFOL N/A 03/30/2017   Procedure: ESOPHAGOGASTRODUODENOSCOPY (EGD) WITH PROPOFOL;  Surgeon: [name], MD;  Location: [facility] ENDOSCOPY;  Service: Endoscopy;  Laterality: N/A;  Endoscopic Capsule Placement   ESOPHAGOGASTRODUODENOSCOPY (EGD) WITH PROPOFOL N/A 12/01/2017   Procedure: ESOPHAGOGASTRODUODENOSCOPY (EGD) WITH PROPOFOL;  Surgeon: [name], MD;  Location: [facility] ENDOSCOPY;  Service: Gastroenterology;  Laterality: N/A;   ESOPHAGOGASTRODUODENOSCOPY (EGD) WITH PROPOFOL N/A 04/12/2021   Procedure: ESOPHAGOGASTRODUODENOSCOPY (EGD) WITH PROPOFOL;  Surgeon: [name], MD;  Location: [facility] ENDOSCOPY;  Service: Gastroenterology;  Laterality: N/A;   FRACTURE SURGERY     GALLBLADDER SURGERY     GIVENS CAPSULE STUDY N/A 03/17/2017   Procedure: GIVENS CAPSULE STUDY;  Surgeon: Lucilla Lame, MD;  Location: [facility] ENDOSCOPY;  Service: Endoscopy;  Laterality: N/A;   GIVENS CAPSULE STUDY N/A 12/01/2017   Procedure: GIVENS CAPSULE STUDY, 12 HOUR;  Surgeon: [name], MD;  Location: [facility] ENDOSCOPY;  Service: Gastroenterology;  Laterality: N/A;   SMALL INTESTINE SURGERY     THROAT SURGERY     nodule on vocal chord   TIBIA FRACTURE SURGERY Right    WRIST SURGERY Right    arthritis    Home Medications:  Allergies as of 07/26/2022  No Known Allergies      Medication List        Accurate as of July 26, 2022  2:09 PM. If you have any questions, ask your nurse or doctor.          STOP taking these medications    cephALEXin 500 MG capsule Commonly known as: KEFLEX Stopped by: Debroah Loop, PA-C        TAKE these medications    ALPRAZolam 0.25 MG tablet Commonly known as: XANAX Take 1 tablet (0.25 mg total) by mouth as needed.   levothyroxine 50 MCG tablet Commonly known as: SYNTHROID Take 1 tablet (50 mcg total) by mouth daily.   nystatin cream Commonly known as: MYCOSTATIN Apply 1 Application topically 2 (two) times daily.   omeprazole 20 MG capsule Commonly known as: PRILOSEC Take 1 capsule (20 mg total) by mouth 2 (two) times daily.   Premarin vaginal cream Generic drug: conjugated estrogens Estrogen Cream Instruction Discard applicator Apply pea sized amount to tip of finger to urethra before bed. Wash hands well after application. Use Monday, Wednesday and Friday   sertraline 50 MG tablet Commonly known as: ZOLOFT Take 1 tablet (50 mg total) by mouth daily.   sulfamethoxazole-trimethoprim 800-160 MG tablet Commonly known as: BACTRIM DS Take 1 tablet by mouth 2 (two) times daily for 7 days. Started by: Debroah Loop, PA-C        Allergies:  No Known Allergies  Family History: Family History  Problem Relation Age of Onset   Diabetes Mother    Heart disease Mother    Diabetes Brother    Breast cancer Neg Hx     Social History:   reports that she has quit smoking. She has never used smokeless tobacco. She reports current alcohol use of about 9.0 standard drinks of alcohol per week. She reports that she does not use drugs.  Physical Exam: BP 130/80   Pulse 76   Ht '5\' 6"'$  (1.676 m)   Wt 210 lb (95.3 kg)   BMI 33.89 kg/m   Constitutional:  Alert and oriented, no acute distress, nontoxic appearing HEENT: [place], AT Cardiovascular: No clubbing, cyanosis, or edema Respiratory: Normal respiratory effort, no increased work of breathing Skin: No rashes, bruises or suspicious lesions Neurologic: Grossly intact, no focal deficits, moving all 4 extremities Psychiatric: Normal mood and affect  Laboratory Data: Results for orders placed or performed in  visit on 07/26/22  Microscopic Examination   Urine  Result Value Ref Range   WBC, UA >30 (A) 0 - 5 /hpf   RBC, Urine 11-30 (A) 0 - 2 /hpf   Epithelial Cells (non renal) 0-10 0 - 10 /hpf   Bacteria, UA Many (A) None seen/Few  Urinalysis, Complete  Result Value Ref Range   Specific Gravity, UA 1.025 1.005 - 1.030   pH, UA 5.0 5.0 - 7.5   Color, UA Yellow Yellow   Appearance Ur Cloudy (A) Clear   Leukocytes,UA 3+ (A) Negative   Protein,UA 1+ (A) Negative/Trace   Glucose, UA Negative Negative   Ketones, UA Negative Negative   RBC, UA 2+ (A) Negative   Bilirubin, UA Negative Negative   Urobilinogen, Ur 0.2 0.2 - 1.0 mg/dL   Nitrite, UA Negative Negative   Microscopic Examination See below:    Assessment & Plan:   1. Recurrent UTI UA appears grossly infected today, likely persistent infection given recent antibiotics with system persistence.  Will start empiric Bactrim and send for culture for further evaluation.  Encouraged her to continue her UTI prevention regimen as above.  This is her first UTI in approximately 6 months, so I would prefer to defer suppressive antibiotics and she agrees.  We will revisit this in the future if her UTIs become more frequent.  We will plan for lab visit for repeat UA in 1 week to prove resolution of microscopic hematuria.  If persistent, recommend CT urogram given recent cystoscopy. - Urinalysis, Complete - CULTURE, URINE COMPREHENSIVE - sulfamethoxazole-trimethoprim (BACTRIM DS) 800-160 MG tablet; Take 1 tablet by mouth 2 (two) times daily for 7 days.  Dispense: 14 tablet; Refill: 0  2. Candidiasis of female genitalia We will refill nystatin per patient request. - nystatin cream (MYCOSTATIN); Apply 1 Application topically 2 (two) times daily.  Dispense: 30 g; Refill: 0  Return in about 1 week (around 08/02/2022) for Lab visit for UA.  Debroah Loop, PA-C  [facility] [address], [place],  [place] [address] [phone]

## 2022-07-29 LAB — CULTURE, URINE COMPREHENSIVE

## 2022-08-04 ENCOUNTER — Other Ambulatory Visit: Payer: Self-pay

## 2022-08-04 ENCOUNTER — Other Ambulatory Visit: Payer: PPO

## 2022-08-04 DIAGNOSIS — R3129 Other microscopic hematuria: Secondary | ICD-10-CM

## 2022-08-04 LAB — URINALYSIS, COMPLETE
Bilirubin, UA: NEGATIVE
Glucose, UA: NEGATIVE
Ketones, UA: NEGATIVE
Nitrite, UA: NEGATIVE
Protein,UA: NEGATIVE
Specific Gravity, UA: 1.025 (ref 1.005–1.030)
Urobilinogen, Ur: 0.2 mg/dL (ref 0.2–1.0)
pH, UA: 5 (ref 5.0–7.5)

## 2022-08-04 LAB — MICROSCOPIC EXAMINATION
Epithelial Cells (non renal): >10 /hpf — AB (ref 0–10)
WBC, UA: >30 /hpf — AB (ref 0–5)

## 2022-08-04 NOTE — Progress Notes (Unsigned)
Repeat lab UA today remains grossly infected in appearance, though contaminated.  We will repeat urine culture and follow-up with patient to determine treatment.

## 2022-08-07 LAB — CULTURE, URINE COMPREHENSIVE

## 2022-08-08 ENCOUNTER — Telehealth: Payer: Self-pay | Admitting: Family Medicine

## 2022-08-08 NOTE — Telephone Encounter (Signed)
Patient left message requesting urine culture results. Please advise

## 2022-08-09 ENCOUNTER — Other Ambulatory Visit: Payer: Self-pay | Admitting: Physician Assistant

## 2022-08-09 DIAGNOSIS — R3129 Other microscopic hematuria: Secondary | ICD-10-CM

## 2022-08-09 NOTE — Telephone Encounter (Signed)
Notified patient of results via MyChart.

## 2022-08-17 NOTE — Progress Notes (Signed)
08/18/2022 12:09 PM   Brenda Campbell 1951/04/08 [id number]  Referring provider: Juline Patch, MD [address] [place],   [id number]  Urological history: 1. rUTI's -Contributing factors of age, vaginal atrophy, -RUS (09/2021) -  Echogenic foci in both kidneys without posterior shadowing. These may represent nonobstructing stones versus renal sinus fat -KUB (09/2021) - 3 punctate calculi measuring up to 2 mm overlying the upper pole the right kidney. A possible 2 mm calculus overlies the medial interpolar region of the left kidney. No urolithiasis. -cysto (12/2021) - Mild erythematous at trigone with whitish plaques consistent with squamous metasplasia -Documented positive urine cultures over the last year  07/26/2022) -E. Coli  12/24/2021 -Staphylococcus aureus  2. Urge incontinence -Contributing factors of age, vaginal atrophy, former smoker, hypertension, anxiety, depression, obesity, arthritis and benzodiazepines  3.  High risk hematuria -Former smoker -CTU pending -cysto (12/2021) - NED -no reports of gross heme -UA negative micro heme   Chief Complaint  Patient presents with   Dysuria    HPI: Brenda Campbell is a 71 y.o. female who presents today for possible UTI.    She states she keeps an UTI.  She has been having frequency, urgency and dysuria.  Patient denies any modifying or aggravating factors.  Patient denies any gross hematuria, dysuria or suprapubic/flank pain.  Patient denies any fevers, chills, nausea or vomiting.    UA positive for nitrite, > 30 WBC's and many bacteria.  PMH: Past Medical History:  Diagnosis Date   Anxiety    Arthritis    Blood transfusion without reported diagnosis    Cancer ([place])    small bowel ca   Depression    GERD (gastroesophageal reflux disease)    Hyperlipidemia    Hypertension    Thyroid disease     Surgical History: Past Surgical History:  Procedure Laterality Date   CESAREAN  SECTION     x 1   CHOLECYSTECTOMY     COLONOSCOPY  10/12/2014   cleared for 3 years   COLONOSCOPY N/A 03/16/2017   Procedure: COLONOSCOPY;  Surgeon: Lucilla Lame, MD;  Location: [facility] ENDOSCOPY;  Service: Endoscopy;  Laterality: N/A;   COLONOSCOPY WITH PROPOFOL N/A 12/01/2017   Procedure: COLONOSCOPY WITH PROPOFOL;  Surgeon: [name], MD;  Location: [facility] ENDOSCOPY;  Service: Gastroenterology;  Laterality: N/A;   COLONOSCOPY WITH PROPOFOL N/A 04/12/2021   Procedure: COLONOSCOPY WITH PROPOFOL;  Surgeon: [name], MD;  Location: [facility] ENDOSCOPY;  Service: Gastroenterology;  Laterality: N/A;   ESOPHAGOGASTRODUODENOSCOPY (EGD) WITH PROPOFOL N/A 03/15/2017   Procedure: ESOPHAGOGASTRODUODENOSCOPY (EGD) WITH PROPOFOL;  Surgeon: [name], MD;  Location: [facility] ENDOSCOPY;  Service: Endoscopy;  Laterality: N/A;   ESOPHAGOGASTRODUODENOSCOPY (EGD) WITH PROPOFOL N/A 03/30/2017   Procedure: ESOPHAGOGASTRODUODENOSCOPY (EGD) WITH PROPOFOL;  Surgeon: [name], MD;  Location: [facility] ENDOSCOPY;  Service: Endoscopy;  Laterality: N/A;  Endoscopic Capsule Placement   ESOPHAGOGASTRODUODENOSCOPY (EGD) WITH PROPOFOL N/A 12/01/2017   Procedure: ESOPHAGOGASTRODUODENOSCOPY (EGD) WITH PROPOFOL;  Surgeon: [name], MD;  Location: [facility] ENDOSCOPY;  Service: Gastroenterology;  Laterality: N/A;   ESOPHAGOGASTRODUODENOSCOPY (EGD) WITH PROPOFOL N/A 04/12/2021   Procedure: ESOPHAGOGASTRODUODENOSCOPY (EGD) WITH PROPOFOL;  Surgeon: [name], MD;  Location: [facility] ENDOSCOPY;  Service: Gastroenterology;  Laterality: N/A;   FRACTURE SURGERY     GALLBLADDER SURGERY     GIVENS CAPSULE STUDY N/A 03/17/2017   Procedure: GIVENS CAPSULE STUDY;  Surgeon: Lucilla Lame, MD;  Location: [facility] ENDOSCOPY;  Service: Endoscopy;  Laterality: N/A;   GIVENS CAPSULE STUDY N/A  12/01/2017   Procedure: GIVENS CAPSULE STUDY, 12 HOUR;  Surgeon: [name], MD;  Location: [facility] ENDOSCOPY;  Service: Gastroenterology;  Laterality: N/A;   SMALL INTESTINE  SURGERY     THROAT SURGERY     nodule on vocal chord   TIBIA FRACTURE SURGERY Right    WRIST SURGERY Right    arthritis    Home Medications:  Allergies as of 08/18/2022   No Known Allergies      Medication List        Accurate as of August 18, 2022 12:09 PM. If you have any questions, ask your nurse or doctor.          ALPRAZolam 0.25 MG tablet Commonly known as: XANAX Take 1 tablet (0.25 mg total) by mouth as needed.   levothyroxine 50 MCG tablet Commonly known as: SYNTHROID Take 1 tablet (50 mcg total) by mouth daily.   nystatin cream Commonly known as: MYCOSTATIN Apply 1 Application topically 2 (two) times daily.   omeprazole 20 MG capsule Commonly known as: PRILOSEC Take 1 capsule (20 mg total) by mouth 2 (two) times daily.   Premarin vaginal cream Generic drug: conjugated estrogens Estrogen Cream Instruction Discard applicator Apply pea sized amount to tip of finger to urethra before bed. Wash hands well after application. Use Monday, Wednesday and Friday   sertraline 50 MG tablet Commonly known as: ZOLOFT Take 1 tablet (50 mg total) by mouth daily.   sulfamethoxazole-trimethoprim 800-160 MG tablet Commonly known as: BACTRIM DS Take 1 tablet by mouth every 12 (twelve) hours.        Allergies: No Known Allergies  Family History: Family History  Problem Relation Age of Onset   Diabetes Mother    Heart disease Mother    Diabetes Brother    Breast cancer Neg Hx     Social History:  reports that she has quit smoking. She has never used smokeless tobacco. She reports current alcohol use of about 9.0 standard drinks of alcohol per week. She reports that she does not use drugs.  ROS: Pertinent ROS in HPI  Physical Exam: BP (!) 166/99   Pulse 69   Ht '5\' 7"'$  (1.702 m)   Wt 220 lb (99.8 kg)   BMI 34.46 kg/m   Constitutional:  Well nourished. Alert and oriented, No acute distress. HEENT: [place] AT, moist mucus membranes.  Trachea  midline Cardiovascular: No clubbing, cyanosis, or edema. Respiratory: Normal respiratory effort, no increased work of breathing. Neurologic: Grossly intact, no focal deficits, moving all 4 extremities. Psychiatric: Normal mood and affect.    Laboratory Data: Urinalysis See Epic and HPI I have reviewed the labs.   Pertinent Imaging: N/A  Assessment & Plan:    1. Suspected UTI -UA grossly infected  -urine culture -Septra DS sent to pharmacy -CTU pending for micro heme and to evaluate for sanctuary sites for infection  Return for keep CT appointment .  These notes generated with voice recognition software. I apologize for typographical errors.  [place], [place] [address]  [place], [place] [address] [phone]

## 2022-08-18 ENCOUNTER — Ambulatory Visit: Payer: PPO | Admitting: Urology

## 2022-08-18 ENCOUNTER — Encounter: Payer: Self-pay | Admitting: Urology

## 2022-08-18 VITALS — BP 166/99 | HR 69 | Ht 67.0 in | Wt 220.0 lb

## 2022-08-18 DIAGNOSIS — N3941 Urge incontinence: Secondary | ICD-10-CM | POA: Diagnosis not present

## 2022-08-18 DIAGNOSIS — R3989 Other symptoms and signs involving the genitourinary system: Secondary | ICD-10-CM

## 2022-08-18 DIAGNOSIS — R300 Dysuria: Secondary | ICD-10-CM | POA: Diagnosis not present

## 2022-08-18 LAB — URINALYSIS, COMPLETE
Bilirubin, UA: NEGATIVE
Glucose, UA: NEGATIVE
Ketones, UA: NEGATIVE
Nitrite, UA: POSITIVE — AB
Specific Gravity, UA: 1.02 (ref 1.005–1.030)
Urobilinogen, Ur: 0.2 mg/dL (ref 0.2–1.0)
pH, UA: 5 (ref 5.0–7.5)

## 2022-08-18 LAB — MICROSCOPIC EXAMINATION: WBC, UA: >30 /hpf — AB (ref 0–5)

## 2022-08-18 MED ORDER — SULFAMETHOXAZOLE-TRIMETHOPRIM 800-160 MG PO TABS: 1.0000 | ORAL_TABLET | Freq: Two times a day (BID) | ORAL | 0 refills | Status: DC

## 2022-08-22 LAB — CULTURE, URINE COMPREHENSIVE

## 2022-08-29 ENCOUNTER — Ambulatory Visit
Admission: RE | Admit: 2022-08-29 | Discharge: 2022-08-29 | Disposition: A | Discharge status: Home / Self Care | Payer: PPO | Source: Ambulatory Visit | Attending: Physician Assistant | Admitting: Physician Assistant

## 2022-08-29 DIAGNOSIS — R319 Hematuria, unspecified: Secondary | ICD-10-CM | POA: Diagnosis not present

## 2022-08-29 DIAGNOSIS — N200 Calculus of kidney: Secondary | ICD-10-CM | POA: Diagnosis not present

## 2022-08-29 DIAGNOSIS — R3129 Other microscopic hematuria: Secondary | ICD-10-CM | POA: Diagnosis not present

## 2022-08-29 LAB — POCT I-STAT CREATININE: Creatinine, Ser: 0.6 mg/dL (ref 0.44–1.00)

## 2022-08-29 MED ORDER — IOHEXOL 300 MG/ML  SOLN
125.0000 mL | Freq: Once | INTRAMUSCULAR | Status: AC | PRN
  Administered 2022-08-29: 125 mL via INTRAVENOUS

## 2022-09-07 ENCOUNTER — Encounter: Payer: Self-pay | Admitting: Family Medicine

## 2022-09-07 ENCOUNTER — Ambulatory Visit (INDEPENDENT_AMBULATORY_CARE_PROVIDER_SITE_OTHER): Payer: PPO | Admitting: Family Medicine

## 2022-09-07 VITALS — BP 110/80 | HR 64 | Ht 67.0 in | Wt 227.0 lb

## 2022-09-07 DIAGNOSIS — K2900 Acute gastritis without bleeding: Secondary | ICD-10-CM | POA: Diagnosis not present

## 2022-09-07 DIAGNOSIS — F330 Major depressive disorder, recurrent, mild: Secondary | ICD-10-CM | POA: Diagnosis not present

## 2022-09-07 DIAGNOSIS — K219 Gastro-esophageal reflux disease without esophagitis: Secondary | ICD-10-CM | POA: Diagnosis not present

## 2022-09-07 DIAGNOSIS — E039 Hypothyroidism, unspecified: Secondary | ICD-10-CM

## 2022-09-07 DIAGNOSIS — F410 Panic disorder [episodic paroxysmal anxiety] without agoraphobia: Secondary | ICD-10-CM

## 2022-09-07 MED ORDER — SERTRALINE HCL 50 MG PO TABS: 50.0000 mg | ORAL_TABLET | Freq: Every day | ORAL | 1 refills | Status: DC

## 2022-09-07 MED ORDER — ALPRAZOLAM 0.25 MG PO TABS: 0.2500 mg | ORAL_TABLET | ORAL | 1 refills | Status: DC | PRN

## 2022-09-07 MED ORDER — OMEPRAZOLE 20 MG PO CPDR: 20.0000 mg | DELAYED_RELEASE_CAPSULE | Freq: Two times a day (BID) | ORAL | 1 refills | Status: DC

## 2022-09-07 MED ORDER — LEVOTHYROXINE SODIUM 50 MCG PO TABS: 50.0000 ug | ORAL_TABLET | Freq: Every day | ORAL | 1 refills | Status: DC

## 2022-09-07 NOTE — Progress Notes (Signed)
Date:  09/07/2022   Name:  Aron Needles   DOB:  09-26-1951   MRN:  [id number]   Chief Complaint: Depression, Gastroesophageal Reflux, and Hypothyroidism  Depression        This is a chronic problem.  The current episode started more than 1 year ago.   The onset quality is gradual.   The problem has been gradually improving since onset.  Associated symptoms include no decreased concentration, no fatigue, no helplessness, no hopelessness, does not have insomnia, not irritable, no restlessness, no decreased interest, no appetite change, no body aches, no myalgias, no headaches, no indigestion, not sad and no suicidal ideas.     The symptoms are aggravated by nothing.  Past treatments include SSRIs - Selective serotonin reuptake inhibitors.  Compliance with treatment is good.  Previous treatment provided moderate relief.  Risk factors include a change in medication usage/dosage.   Past medical history includes anxiety.   Gastroesophageal Reflux She reports no abdominal pain, no choking, no coughing, no dysphagia, no heartburn, no hoarse voice, no nausea or no wheezing. This is a chronic problem. The symptoms are aggravated by certain foods. Pertinent negatives include no fatigue or melena. She has tried a PPI for the symptoms. The treatment provided moderate relief.  Anxiety Presents for follow-up visit. Symptoms include excessive worry and nervous/anxious behavior. Patient reports no decreased concentration, insomnia, nausea, palpitations, restlessness or suicidal ideas. Symptoms occur occasionally. The severity of symptoms is mild. The quality of sleep is fair.      Lab Results  Component Value Date   NA 140 09/07/2021   K 3.9 09/07/2021   CO2 24 09/07/2021   GLUCOSE 104 (H) 09/07/2021   BUN 10 09/07/2021   CREATININE 0.60 08/29/2022   CALCIUM 8.6 (L) 09/07/2021   EGFR 95 09/07/2021   GFRNONAA 90 10/05/2020   Lab Results  Component Value Date   CHOL 209 (H) 03/07/2022   HDL  68 03/07/2022   LDLCALC 126 (H) 03/07/2022   TRIG 85 03/07/2022   CHOLHDL 4.3 10/22/2018   Lab Results  Component Value Date   TSH 2.030 03/07/2022   No results found for: "HGBA1C" Lab Results  Component Value Date   WBC 5.7 02/14/2020   HGB 14.5 09/07/2021   HCT 42.4 02/14/2020   MCV 98 (H) 02/14/2020   PLT 248 02/14/2020   Lab Results  Component Value Date   ALT 13 (L) 03/15/2017   AST 17 03/15/2017   ALKPHOS 54 03/15/2017   BILITOT 0.5 03/15/2017   No results found for: "25OHVITD2", "25OHVITD3", "VD25OH"   Review of Systems  Constitutional:  Negative for appetite change and fatigue.  HENT:  Negative for hoarse voice and trouble swallowing.   Respiratory:  Negative for cough, choking and wheezing.   Cardiovascular:  Negative for palpitations and leg swelling.  Gastrointestinal:  Negative for abdominal pain, dysphagia, heartburn, melena and nausea.  Musculoskeletal:  Negative for myalgias.  Neurological:  Negative for headaches.  Psychiatric/Behavioral:  Positive for depression. Negative for decreased concentration and suicidal ideas. The patient is nervous/anxious. The patient does not have insomnia.     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   Gastroesophageal reflux disease 12/22/2014   Hypothyroidism 12/22/2014   Obesity 12/22/2014   Tachycardia 12/22/2014   Malaise 12/22/2014    No Known Allergies  Past Surgical History:  Procedure Laterality Date   CESAREAN SECTION     x 1   CHOLECYSTECTOMY     COLONOSCOPY   10/12/2014   cleared for 3 years   COLONOSCOPY N/A 03/16/2017   Procedure: COLONOSCOPY;  Surgeon: Lucilla Lame, MD;  Location: [facility] ENDOSCOPY;  Service: Endoscopy;  Laterality: N/A;   COLONOSCOPY WITH PROPOFOL N/A 12/01/2017   Procedure: COLONOSCOPY WITH PROPOFOL;  Surgeon: [name], MD;  Location: [facility] ENDOSCOPY;  Service: Gastroenterology;  Laterality: N/A;   COLONOSCOPY WITH PROPOFOL N/A 04/12/2021   Procedure: COLONOSCOPY WITH PROPOFOL;  Surgeon: [name], MD;  Location: [facility] ENDOSCOPY;  Service: Gastroenterology;  Laterality: N/A;   ESOPHAGOGASTRODUODENOSCOPY (EGD) WITH PROPOFOL N/A 03/15/2017   Procedure: ESOPHAGOGASTRODUODENOSCOPY (EGD) WITH PROPOFOL;  Surgeon: [name], MD;  Location: [facility] ENDOSCOPY;  Service: Endoscopy;  Laterality: N/A;   ESOPHAGOGASTRODUODENOSCOPY (EGD) WITH PROPOFOL N/A 03/30/2017   Procedure: ESOPHAGOGASTRODUODENOSCOPY (EGD) WITH PROPOFOL;  Surgeon: [name], MD;  Location: [facility] ENDOSCOPY;  Service: Endoscopy;  Laterality: N/A;  Endoscopic Capsule Placement   ESOPHAGOGASTRODUODENOSCOPY (EGD) WITH PROPOFOL N/A 12/01/2017   Procedure: ESOPHAGOGASTRODUODENOSCOPY (EGD) WITH PROPOFOL;  Surgeon: [name], MD;  Location: [facility] ENDOSCOPY;  Service: Gastroenterology;  Laterality: N/A;   ESOPHAGOGASTRODUODENOSCOPY (EGD) WITH PROPOFOL N/A 04/12/2021   Procedure: ESOPHAGOGASTRODUODENOSCOPY (EGD) WITH PROPOFOL;  Surgeon: [name], MD;  Location: [facility] ENDOSCOPY;  Service: Gastroenterology;  Laterality: N/A;   FRACTURE SURGERY     GALLBLADDER SURGERY     GIVENS CAPSULE STUDY N/A 03/17/2017   Procedure: GIVENS CAPSULE STUDY;  Surgeon: Lucilla Lame, MD;  Location: [facility] ENDOSCOPY;  Service: Endoscopy;  Laterality: N/A;   GIVENS CAPSULE STUDY N/A 12/01/2017   Procedure: GIVENS CAPSULE STUDY, 12 HOUR;  Surgeon: [name], MD;  Location[facility] ENDOSCOPY;  Service: Gastroenterology;  Laterality: N/A;   SMALL INTESTINE SURGERY     THROAT SURGERY     nodule on vocal chord    TIBIA FRACTURE SURGERY Right    WRIST SURGERY Right    arthritis    Social History   Tobacco Use   Smoking status: Former   Smokeless tobacco: Never  Vaping Use   Vaping Use: Never used  Substance Use Topics   Alcohol use: Yes    Alcohol/week: 9.0 standard drinks of alcohol    Types: 4 Cans of beer, 5 Shots of liquor per week   Drug use: No     Medication list has been reviewed and updated.  Current Meds  Medication Sig   ALPRAZolam (XANAX) 0.25 MG tablet Take 1 tablet (0.25 mg total) by mouth as needed.   conjugated estrogens (PREMARIN) vaginal cream Estrogen Cream Instruction Discard applicator Apply pea sized amount to tip of finger to urethra before bed. Wash hands well after application. Use Monday, Wednesday and Friday   levothyroxine (SYNTHROID) 50 MCG tablet Take 1 tablet (50 mcg total) by mouth daily.   nystatin cream (MYCOSTATIN) Apply 1 Application topically 2 (two) times daily.   omeprazole (PRILOSEC) 20 MG capsule Take 1 capsule (20 mg total) by mouth 2 (two) times daily.   sertraline (ZOLOFT) 50 MG tablet Take 1 tablet (50 mg total) by mouth daily.       09/07/2022    9:34 AM 03/07/2022  10:18 AM 09/07/2021   10:07 AM 08/04/2021   10:58 AM  GAD 7 : Generalized Anxiety Score  Nervous, Anxious, on Edge 0 0 0 0  Control/stop worrying 0 0 0 0  Worry too much - different things 0 0 0 0  Trouble relaxing 0 0 0 0  Restless 0 0 0 0  Easily annoyed or irritable 0 0 0 0  Afraid - awful might happen 0 0 0 0  Total GAD 7 Score 0 0 0 0  Anxiety Difficulty Not difficult at all Not difficult at all         09/07/2022    9:34 AM 03/07/2022   10:18 AM 10/20/2021    1:55 PM  Depression screen PHQ 2/9  Decreased Interest 0 0 0  Down, Depressed, Hopeless 0 0 0  PHQ - 2 Score 0 0 0  Altered sleeping 0 0   Tired, decreased energy 0 0   Change in appetite 0 0   Feeling bad or failure about yourself  0 0   Trouble concentrating 0 0   Moving slowly or  fidgety/restless 0 0   Suicidal thoughts 0 0   PHQ-9 Score 0 0   Difficult doing work/chores Not difficult at all Not difficult at all     BP Readings from Last 3 Encounters:  09/07/22 110/80  08/18/22 (!) 166/99  07/26/22 130/80    Physical Exam Vitals and nursing note reviewed.  Constitutional:      General: She is not irritable.    Appearance: She is well-developed.  HENT:     Head: Normocephalic.     Right Ear: External ear normal.     Left Ear: External ear normal.     Mouth/Throat:     Mouth: Mucous membranes are moist.  Eyes:     General: Lids are everted, no foreign bodies appreciated. No scleral icterus.       Left eye: No foreign body or hordeolum.     Conjunctiva/sclera: Conjunctivae normal.     Right eye: Right conjunctiva is not injected.     Left eye: Left conjunctiva is not injected.     Pupils: Pupils are equal, round, and reactive to light.  Neck:     Thyroid: No thyromegaly.     Vascular: No JVD.     Trachea: No tracheal deviation.  Cardiovascular:     Rate and Rhythm: Normal rate and regular rhythm.     Heart sounds: Normal heart sounds, S1 normal and S2 normal. No murmur heard.    No systolic murmur is present.     No diastolic murmur is present.     No friction rub. No gallop. No S3 or S4 sounds.  Pulmonary:     Effort: Pulmonary effort is normal. No respiratory distress.     Breath sounds: Normal breath sounds. No wheezing or rales.  Abdominal:     General: Bowel sounds are normal.     Palpations: Abdomen is soft. There is no mass.     Tenderness: There is no abdominal tenderness. There is no guarding or rebound.  Musculoskeletal:        General: No tenderness. Normal range of motion.     Cervical back: Normal range of motion and neck supple.  Lymphadenopathy:     Cervical: No cervical adenopathy.  Skin:    General: Skin is warm.     Findings: No rash.  Neurological:     Mental Status: She is alert and oriented  to person, place, and time.      Cranial Nerves: No cranial nerve deficit.     Deep Tendon Reflexes: Reflexes normal.  Psychiatric:        Mood and Affect: Mood is not anxious or depressed.     Wt Readings from Last 3 Encounters:  09/07/22 227 lb (103 kg)  08/18/22 220 lb (99.8 kg)  07/26/22 210 lb (95.3 kg)    BP 110/80   Pulse 64   Ht _0  (1.702 m)   Wt 227 lb (103 kg)   BMI 35.55 kg/m   Assessment and Plan:   1. Hypothyroidism, unspecified type .  Controlled.  Stable.  Continue levothyroxine 50 mcg.  Last several TSHs have been normal range we will recheck in 6 months. - levothyroxine (SYNTHROID) 50 MCG tablet; Take 1 tablet (50 mcg total) by mouth daily.  Dispense: 90 tablet; Refill: 1  2. Gastroesophageal reflux disease, unspecified whether esophagitis present Chronic.  Controlled.  Stable.  Continue omeprazole 20 mg once a day. - omeprazole (PRILOSEC) 20 MG capsule; Take 1 capsule (20 mg total) by mouth 2 (two) times daily.  Dispense: 180 capsule; Refill: 1  3. Acute gastritis without hemorrhage, unspecified gastritis type Chronic.  Gastritis controlled on omeprazole 20 mg once a day. - omeprazole (PRILOSEC) 20 MG capsule; Take 1 capsule (20 mg total) by mouth 2 (two) times daily.  Dispense: 180 capsule; Refill: 1  4. 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  Controlled.  Stable.  PHQ is 0 continue sertraline 50 mg once a day. - sertraline (ZOLOFT) 50 MG tablet; Take 1 tablet (50 mg total) by mouth daily.  Dispense: 90 tablet; Refill: 1  5. Panic disorder Anxiety with panic disorder patient only takes Xanax on a very infrequent basis.  We will refill her Xanax 0.25 on a as needed basis and encouraged to continue sertraline as well.  GAD score is noted today to be 0 - ALPRAZolam (XANAX) 0.25 MG tablet; Take 1 tablet (0.25 mg total) by mouth as needed.  Dispense: 30 tablet; Refill: 1    Otilio Miu, MD

## 2022-09-19 ENCOUNTER — Ambulatory Visit (INDEPENDENT_AMBULATORY_CARE_PROVIDER_SITE_OTHER): Payer: PPO

## 2022-09-19 DIAGNOSIS — Z23 Encounter for immunization: Secondary | ICD-10-CM | POA: Diagnosis not present

## 2022-10-26 ENCOUNTER — Ambulatory Visit: Payer: PPO | Admitting: Physician Assistant

## 2022-10-26 ENCOUNTER — Ambulatory Visit (INDEPENDENT_AMBULATORY_CARE_PROVIDER_SITE_OTHER): Payer: PPO

## 2022-10-26 VITALS — BP 138/83 | HR 120 | Ht 67.0 in | Wt 220.0 lb

## 2022-10-26 DIAGNOSIS — R350 Frequency of micturition: Secondary | ICD-10-CM | POA: Diagnosis not present

## 2022-10-26 DIAGNOSIS — R300 Dysuria: Secondary | ICD-10-CM | POA: Diagnosis not present

## 2022-10-26 DIAGNOSIS — R3129 Other microscopic hematuria: Secondary | ICD-10-CM | POA: Diagnosis not present

## 2022-10-26 DIAGNOSIS — Z0000 Encounter for general adult medical examination without abnormal findings: Secondary | ICD-10-CM | POA: Diagnosis not present

## 2022-10-26 DIAGNOSIS — N390 Urinary tract infection, site not specified: Secondary | ICD-10-CM | POA: Diagnosis not present

## 2022-10-26 LAB — URINALYSIS, COMPLETE
Bilirubin, UA: NEGATIVE
Glucose, UA: NEGATIVE
Ketones, UA: NEGATIVE
Nitrite, UA: POSITIVE — AB
Protein,UA: NEGATIVE
Specific Gravity, UA: 1.03 (ref 1.005–1.030)
Urobilinogen, Ur: 0.2 mg/dL (ref 0.2–1.0)
pH, UA: 5.5 (ref 5.0–7.5)

## 2022-10-26 LAB — MICROSCOPIC EXAMINATION
Epithelial Cells (non renal): >10 /hpf — AB (ref 0–10)
WBC, UA: >30 /hpf — AB (ref 0–5)

## 2022-10-26 MED ORDER — SULFAMETHOXAZOLE-TRIMETHOPRIM 800-160 MG PO TABS: 1.0000 | ORAL_TABLET | Freq: Two times a day (BID) | ORAL | 0 refills | Status: AC

## 2022-10-26 NOTE — Progress Notes (Signed)
Subjective:   Brenda Campbell is a 71 y.o. female who presents for Medicare Annual (Subsequent) preventive examination.   I connected with  Brenda Campbell on 10/26/22 by a audio enabled telemedicine application and verified that I am speaking with the correct person using two identifiers.  Patient Location: Home  Provider Location: Office/Clinic  I discussed the limitations of evaluation and management by telemedicine. The patient expressed understanding and agreed to proceed.   Review of Systems    Defer to PCP Cardiac Risk Factors include: advanced age (>62mn, >>39women)     Objective:    There were no vitals filed for this visit. There is no height or weight on file to calculate BMI.     10/26/2022    3:20 PM 10/20/2021    1:38 PM 04/12/2021    8:40 AM 09/30/2020    3:41 PM 01/27/2020   12:19 PM 12/09/2017    1:28 PM 03/15/2017   12:29 PM  Advanced Directives  Does Patient Have a Medical Advance Directive? Yes No No No No No No  Does patient want to make changes to medical advance directive? No - Patient declined        Would patient like information on creating a medical advance directive?  No - Patient declined  Yes (MAU/Ambulatory/Procedural Areas - Information given) No - Patient declined  No - Patient declined    Current Medications (verified) Outpatient Encounter Medications as of 10/26/2022  Medication Sig   ALPRAZolam (XANAX) 0.25 MG tablet Take 1 tablet (0.25 mg total) by mouth as needed.   conjugated estrogens (PREMARIN) vaginal cream Estrogen Cream Instruction Discard applicator Apply pea sized amount to tip of finger to urethra before bed. Wash hands well after application. Use Monday, Wednesday and Friday   levothyroxine (SYNTHROID) 50 MCG tablet Take 1 tablet (50 mcg total) by mouth daily.   nystatin cream (MYCOSTATIN) Apply 1 Application topically 2 (two) times daily.   omeprazole (PRILOSEC) 20 MG capsule Take 1 capsule (20 mg total) by  mouth 2 (two) times daily.   sertraline (ZOLOFT) 50 MG tablet Take 1 tablet (50 mg total) by mouth daily.   sulfamethoxazole-trimethoprim (BACTRIM DS) 800-160 MG tablet Take 1 tablet by mouth 2 (two) times daily for 5 days.   No facility-administered encounter medications on file as of 10/26/2022.    Allergies (verified) Patient has no known allergies.   History: Past Medical History:  Diagnosis Date   Anxiety    Arthritis    Blood transfusion without reported diagnosis    Cancer ([place]    small bowel ca   Depression    GERD (gastroesophageal reflux disease)    Hyperlipidemia    Hypertension    Thyroid disease    Past Surgical History:  Procedure Laterality Date   CESAREAN SECTION     x 1   CHOLECYSTECTOMY     COLONOSCOPY  10/12/2014   cleared for 3 years   COLONOSCOPY N/A 03/16/2017   Procedure: COLONOSCOPY;  Surgeon: DLucilla Lame MD;  Location: [facility] ENDOSCOPY;  Service: Endoscopy;  Laterality: N/A;   COLONOSCOPY WITH PROPOFOL N/A 12/01/2017   Procedure: COLONOSCOPY WITH PROPOFOL;  Surgeon: AJonathon Bellows MD;  [facility]  Service: Gastroenterology;  Laterality: N/A;   COLONOSCOPY WITH PROPOFOL N/A 04/12/2021   Procedure: COLONOSCOPY WITH PROPOFOL;  Surgeon: AJonathon Bellows MD;  [facility];  Service: Gastroenterology;  Laterality: N/A;   ESOPHAGOGASTRODUODENOSCOPY (EGD) WITH PROPOFOL N/A 03/15/2017   Procedure: ESOPHAGOGASTRODUODENOSCOPY (EGD) WITH PROPOFOL;  Surgeon: [name], MD;  Location: [facility] ENDOSCOPY;  Service: Endoscopy;  Laterality: N/A;   ESOPHAGOGASTRODUODENOSCOPY (EGD) WITH PROPOFOL N/A 03/30/2017   Procedure: ESOPHAGOGASTRODUODENOSCOPY (EGD) WITH PROPOFOL;  Surgeon: [name], MD;  Location: [facility] ENDOSCOPY;  Service: Endoscopy;  Laterality: N/A;  Endoscopic Capsule Placement   ESOPHAGOGASTRODUODENOSCOPY (EGD) WITH PROPOFOL N/A 12/01/2017   Procedure: ESOPHAGOGASTRODUODENOSCOPY (EGD) WITH PROPOFOL;  Surgeon: [name], MD;  Location:  [facility] ENDOSCOPY;  Service: Gastroenterology;  Laterality: N/A;   ESOPHAGOGASTRODUODENOSCOPY (EGD) WITH PROPOFOL N/A 04/12/2021   Procedure: ESOPHAGOGASTRODUODENOSCOPY (EGD) WITH PROPOFOL;  Surgeon: [name], MD;  Location: [facility] ENDOSCOPY;  Service: Gastroenterology;  Laterality: N/A;   FRACTURE SURGERY     GALLBLADDER SURGERY     GIVENS CAPSULE STUDY N/A 03/17/2017   Procedure: GIVENS CAPSULE STUDY;  Surgeon: Lucilla Lame, MD;  Location: [facility] ENDOSCOPY;  Service: Endoscopy;  Laterality: N/A;   GIVENS CAPSULE STUDY N/A 12/01/2017   Procedure: GIVENS CAPSULE STUDY, 12 HOUR;  Surgeon: [name], MD;  Location: [facility] ENDOSCOPY;  Service: Gastroenterology;  Laterality: N/A;   SMALL INTESTINE SURGERY     THROAT SURGERY     nodule on vocal chord   TIBIA FRACTURE SURGERY Right    WRIST SURGERY Right    arthritis   Family History  Problem Relation Age of Onset   Diabetes Mother    Heart disease Mother    Diabetes Brother    Breast cancer Neg Hx    Social History   Socioeconomic History   Marital status: Married    Spouse name: Not on file   Number of children: Not on file   Years of education: Not on file   Highest education level: Not on file  Occupational History   Not on file  Tobacco Use   Smoking status: Former   Smokeless tobacco: Never  Vaping Use   Vaping Use: Never used  Substance and Sexual Activity   Alcohol use: Yes    Alcohol/week: 9.0 standard drinks of alcohol    Types: 4 Cans of beer, 5 Shots of liquor per week   Drug use: No   Sexual activity: Not Currently  Other Topics Concern   Not on file  Social History Narrative   Not on file   Social Determinants of Health   Financial Resource Strain: Low Risk  (10/26/2022)   Overall Financial Resource Strain (CARDIA)    Difficulty of Paying Living Expenses: Not hard at all  Food Insecurity: No Food Insecurity (10/26/2022)   Hunger Vital Sign    Worried About Running Out of Food in the Last Year: Never true     Ran Out of Food in the Last Year: Never true  Transportation Needs: No Transportation Needs (10/26/2022)   PRAPARE - Hydrologist (Medical): No    Lack of Transportation (Non-Medical): No  Physical Activity: Insufficiently Active (10/26/2022)   Exercise Vital Sign    Days of Exercise per Week: 2 days    Minutes of Exercise per Session: 20 min  Stress: No Stress Concern Present (10/26/2022)   [place]    Feeling of Stress : Not at all  Social Connections: Moderately Integrated (10/26/2022)   Social Connection and Isolation Panel [NHANES]    Frequency of Communication with Friends and Family: More than three times a week    Frequency of Social Gatherings with Friends and Family: More than three times a week    Attends Religious Services: Never  Active Member of Clubs or Organizations: Yes    Attends Archivist Meetings: 1 to 4 times per year    Marital Status: Married    Tobacco Counseling Counseling given: Not Answered   Clinical Intake:  Pre-visit preparation completed: Yes  Pain : No/denies pain     Nutritional Risks: None Diabetes: No  How often do you need to have someone help you when you read instructions, pamphlets, or other written materials from your doctor or pharmacy?: (P) 2 - Rarely  Diabetic? No.         Activities of Daily Living    10/26/2022    3:21 PM 10/26/2022    9:06 AM  In your present state of health, do you have any difficulty performing the following activities:  Hearing? 0 0  Vision? 0 1  Difficulty concentrating or making decisions? 0 0  Walking or climbing stairs? 1 1  Dressing or bathing? 0 0  Doing errands, shopping? 0 0  Preparing Food and eating ? N N  Using the Toilet? N N  In the past six months, have you accidently leaked urine? N Y  Do you have problems with loss of bowel control? N N  Managing your Medications? N    Managing your Finances? N N  Housekeeping or managing your Housekeeping? N N    Patient Care Team: Juline Patch, MD as PCP - General (Family Medicine)  Indicate any recent Medical Services you may have received from other than [facility] providers in the past year (date may be approximate).     Assessment:   This is a routine wellness examination for Brenda.  Hearing/Vision screen No results found.  Dietary issues and exercise activities discussed: Current Exercise Habits: The patient does not participate in regular exercise at present   Goals Addressed   None   Depression Screen    10/26/2022    3:18 PM 09/07/2022    9:34 AM 03/07/2022   10:18 AM 10/20/2021    1:55 PM 09/07/2021   10:06 AM 08/04/2021   10:58 AM 07/08/2021   10:17 AM  PHQ 2/9 Scores  PHQ - 2 Score 0 0 0 0 0 0 0  PHQ- 9 Score 0 0 0  1 0 0    Fall Risk    10/26/2022    3:21 PM 10/26/2022    9:06 AM 09/07/2022    9:34 AM 10/20/2021    1:39 PM 08/04/2021   10:58 AM  Fall Risk   Falls in the past year? 1 1 0 1 0  Number falls in past yr: 1 1 0 0 0  Injury with Fall? 0 0 0 1 0  Risk for fall due to : History of fall(s)  No Fall Risks History of fall(s) No Fall Risks  Follow up Falls evaluation completed  Falls evaluation completed Falls prevention discussed Falls evaluation completed    [place]:  Any stairs in or around the home? Yes  If so, are there any without handrails? Yes  Home free of loose throw rugs in walkways, pet beds, electrical cords, etc? Yes  Adequate lighting in your home to reduce risk of falls? Yes   ASSISTIVE DEVICES UTILIZED TO PREVENT FALLS:  Life alert? No  Use of a cane, walker or w/c? No  Grab bars in the bathroom? No  Shower chair or bench in shower? No  Elevated toilet seat or a handicapped toilet? No   Cognitive Function:  10/26/2022    3:22 PM 09/30/2020    3:46 PM  6CIT Screen  What Year? 0 points 0 points  What  month? 0 points 0 points  What time? 0 points 0 points  Count back from 20 0 points 0 points  Months in reverse 0 points 0 points  Repeat phrase 2 points 0 points  Total Score 2 points 0 points    Immunizations Immunization History  Administered Date(s) Administered   Fluad Quad(high Dose 65+) 11/19/2019, 10/05/2020, 09/07/2021, 09/19/2022   Influenza, High Dose Seasonal PF 10/14/2018   Influenza-Unspecified 09/12/2015, 10/14/2018   Moderna Sars-Covid-2 Vaccination 01/23/2020, 02/20/2020   PNEUMOCOCCAL CONJUGATE-20 03/23/2021   Pneumococcal Conjugate-13 12/30/2015, 10/14/2018   Pneumococcal Polysaccharide-23 12/30/2015, 12/30/2015   Tdap 12/21/2013   Zoster Recombinat (Shingrix) 10/14/2018    TDAP status: Up to date  Flu Vaccine status: Up to date  Pneumococcal vaccine status: Up to date  Covid-19 vaccine status: Completed vaccines  Qualifies for Shingles Vaccine? Yes   Zostavax completed No   Shingrix Completed?: No.    Education has been provided regarding the importance of this vaccine. Patient has been advised to call insurance company to determine out of pocket expense if they have not yet received this vaccine. Advised may also receive vaccine at local pharmacy or Health Dept. Verbalized acceptance and understanding.  Screening Tests Health Maintenance  Topic Date Due   Zoster Vaccines- Shingrix (2 of 2) 12/07/2022 (Originally 12/09/2018)   Hepatitis C Screening  09/08/2023 (Originally 12/24/1968)   Medicare Annual Wellness (AWV)  10/27/2023   TETANUS/TDAP  12/22/2023   MAMMOGRAM  03/28/2024   COLONOSCOPY (Pts 45-1yr Insurance coverage will need to be confirmed)  04/12/2024   Pneumonia Vaccine 71 Years old  Completed   INFLUENZA VACCINE  Completed   DEXA SCAN  Completed   HPV VACCINES  Aged Out   COVID-19 Vaccine  Discontinued    Health Maintenance  There are no preventive care reminders to display for this patient.   Colorectal cancer screening: Type of  screening: Colonoscopy. Completed 04/12/2021. Repeat every 3 years  Mammogram status: No longer required due to age.  Lung Cancer Screening: (Low Dose CT Chest recommended if Age 71-80years, 30 pack-year currently smoking OR have quit w/in 15years.) does not qualify.   Additional Screening:  Hepatitis C Screening: does not qualify.  Vision Screening: Recommended annual ophthalmology exams for early detection of glaucoma and other disorders of the eye. Is the patient up to date with their annual eye exam?  Yes  Who is the provider or what is the name of the office in which the patient attends annual eye exams? [facility] not [facility] a provider, would they like to be referred to a provider to establish care?  N/A .   Dental Screening: Recommended annual dental exams for proper oral hygiene  Community Resource Referral / Chronic Care Management: CRR required this visit?  No   CCM required this visit?  No      Plan:     I have personally reviewed and noted the following in the patient's chart:   Medical and social history Use of alcohol, tobacco or illicit drugs  Current medications and supplements including opioid prescriptions. Patient is not currently taking opioid prescriptions. Functional ability and status Nutritional status Physical activity Advanced directives List of other physicians Hospitalizations, surgeries, and ER visits in previous 12 months Vitals Screenings to include cognitive, depression, and falls Referrals and appointments  In  addition, I have reviewed and discussed with patient certain preventive protocols, quality metrics, and best practice recommendations. A written personalized care plan for preventive services as well as general preventive health recommendations were provided to patient.     Brenda Campbell, Pearson   10/26/2022    Brenda Campbell , Thank you for taking time to come for your Medicare Wellness Visit. I  appreciate your ongoing commitment to your health goals. Please review the following plan we discussed and let me know if I can assist you in the future.   These are the goals we discussed:  Goals      DIET - INCREASE WATER INTAKE     Recommend drinking 6-8 glasses of water per day      Weight (lb) < 200 lb (90.7 kg)     Lose weight        This is a list of the screening recommended for you and due dates:  Health Maintenance  Topic Date Due   Zoster (Shingles) Vaccine (2 of 2) 12/07/2022*   Hepatitis C Screening: USPSTF Recommendation to screen - Ages 18-79 yo.  09/08/2023*   Medicare Annual Wellness Visit  10/27/2023   Tetanus Vaccine  12/22/2023   Mammogram  03/28/2024   Colon Cancer Screening  04/12/2024   Pneumonia Vaccine  Completed   Flu Shot  Completed   DEXA scan (bone density measurement)  Completed   HPV Vaccine  Aged Out   COVID-19 Vaccine  Discontinued  *Topic was postponed. The date shown is not the original due date.     Nurse Notes: None

## 2022-10-26 NOTE — Progress Notes (Signed)
10/26/2022 5:07 PM   Brenda Campbell 06-07-1951 [id number]  CC: Chief Complaint  Patient presents with   Recurrent UTI   HPI: Brenda Campbell is a 71 y.o. female with PMH recurrent UTI, OAB wet, and microscopic hematuria with benign CT urogram in September 2023 who presents today for evaluation of possible UTI.   Today she reports a 4-day history of dysuria and frequency.  She has been staying well-hydrated and her symptoms have improved but not resolved.  She denies fever, chills, nausea, vomiting, gross hematuria, and flank pain.  In-office UA today positive for trace lysed blood, nitrites, and 2+ leukocytes; urine microscopy with >30 WBCs/HPF, >10 epithelial cells/hpf, and many bacteria.   PMH: Past Medical History:  Diagnosis Date   Anxiety    Arthritis    Blood transfusion without reported diagnosis    Cancer ([place])    small bowel ca   Depression    GERD (gastroesophageal reflux disease)    Hyperlipidemia    Hypertension    Thyroid disease     Surgical History: Past Surgical History:  Procedure Laterality Date   CESAREAN SECTION     x 1   CHOLECYSTECTOMY     COLONOSCOPY  10/12/2014   cleared for 3 years   COLONOSCOPY N/A 03/16/2017   Procedure: COLONOSCOPY;  Surgeon: Lucilla Lame, MD;  Location: [facility] ENDOSCOPY;  Service: Endoscopy;  Laterality: N/A;   COLONOSCOPY WITH PROPOFOL N/A 12/01/2017   Procedure: COLONOSCOPY WITH PROPOFOL;  Surgeon: [name], MD;  [facility] END[facility];  Service: Gastroenterology;  Laterality: N/A;   COLONOSCOPY WITH PROPOFOL N/A 04/12/2021   Procedure: COLONOSCOPY WITH PROPOFOL;  Surgeon: [name], MD;  Location: [facility] ENDOSCOPY;  Service: Gastroenterology;  Laterality: N/A;   ESOPHAGOGASTRODUODENOSCOPY (EGD) WITH PROPOFOL N/A 03/15/2017   Procedure: ESOPHAGOGASTRODUODENOSCOPY (EGD) WITH PROPOFOL;  Surgeon: [name], MD;  Location: [facility] ENDOSCOPY;  Service: Endoscopy;  Laterality: N/A;    ESOPHAGOGASTRODUODENOSCOPY (EGD) WITH PROPOFOL N/A 03/30/2017   Procedure: ESOPHAGOGASTRODUODENOSCOPY (EGD) WITH PROPOFOL;  Surgeon: [name], MD;  Location: [facility] ENDOSCOPY;  Service: Endoscopy;  Laterality: N/A;  Endoscopic Capsule Placement   ESOPHAGOGASTRODUODENOSCOPY (EGD) WITH PROPOFOL N/A 12/01/2017   Procedure: ESOPHAGOGASTRODUODENOSCOPY (EGD) WITH PROPOFOL;  Surgeon: [name], MD;  [facility] END[facility]COPY;  Service: Gastroenterology;  Laterality: N/A;   ESOPHAGOGASTRODUODENOSCOPY (EGD) WITH PROPOFOL N/A 04/12/2021   Procedure: ESOPHAGOGASTRODUODENOSCOPY (EGD) WITH PROPOFOL;  Surgeon: [name], MD;  [facility] END[facility]COPY;  Service: Gastroenterology;  Laterality: N/A;   FRACTURE SURGERY     GALLBLADDER SURGERY     GIVENS CAPSULE STUDY N/A 03/17/2017   Procedure: GIVENS CAPSULE STUDY;  Surgeon: Lucilla Lame, MD;  Location: [facility] ENDOSCOPY;  Service: Endoscopy;  Laterality: N/A;   GIVENS CAPSULE STUDY N/A 12/01/2017   Procedure: GIVENS CAPSULE STUDY, 12 HOUR;  Surgeon: [name], MD;  Location[facility] ENDOSCOPY;  Service: Gastroenterology;  Laterality: N/A;   SMALL INTESTINE SURGERY     THROAT SURGERY     nodule on vocal chord   TIBIA FRACTURE SURGERY Right    WRIST SURGERY Right    arthritis    Home Medications:  Allergies as of 10/26/2022   No Known Allergies      Medication List        Accurate as of October 26, 2022  5:07 PM. If you have any questions, ask your nurse or doctor.          ALPRAZolam 0.25 MG tablet Commonly known as: XANAX Take 1 tablet (0.25 mg total) by mouth  as needed.   levothyroxine 50 MCG tablet Commonly known as: SYNTHROID Take 1 tablet (50 mcg total) by mouth daily.   nystatin cream Commonly known as: MYCOSTATIN Apply 1 Application topically 2 (two) times daily.   omeprazole 20 MG capsule Commonly known as: PRILOSEC Take 1 capsule (20 mg total) by mouth 2 (two) times daily.   Premarin vaginal cream Generic drug: conjugated  estrogens Estrogen Cream Instruction Discard applicator Apply pea sized amount to tip of finger to urethra before bed. Wash hands well after application. Use Monday, Wednesday and Friday   sertraline 50 MG tablet Commonly known as: ZOLOFT Take 1 tablet (50 mg total) by mouth daily.   sulfamethoxazole-trimethoprim 800-160 MG tablet Commonly known as: BACTRIM DS Take 1 tablet by mouth 2 (two) times daily for 5 days. Started by: Debroah Loop, PA-C        Allergies:  No Known Allergies  Family History: Family History  Problem Relation Age of Onset   Diabetes Mother    Heart disease Mother    Diabetes Brother    Breast cancer Neg Hx     Social History:   reports that she has quit smoking. She has never used smokeless tobacco. She reports current alcohol use of about 9.0 standard drinks of alcohol per week. She reports that she does not use drugs.  Physical Exam: BP 138/83   Pulse (!) 120   Ht '5\' 7"'$  (1.702 m)   Wt 220 lb (99.8 kg)   BMI 34.46 kg/m   Constitutional:  Alert and oriented, no acute distress, nontoxic appearing HEENT: [place], AT Cardiovascular: No clubbing, cyanosis, or edema Respiratory: Normal respiratory effort, no increased work of breathing Skin: No rashes, bruises or suspicious lesions Neurologic: Grossly intact, no focal deficits, moving all 4 extremities Psychiatric: Normal mood and affect  Laboratory Data: Results for orders placed or performed in visit on 10/26/22  Microscopic Examination   Urine  Result Value Ref Range   WBC, UA >30 (A) 0 - 5 /hpf   RBC, Urine 0-2 0 - 2 /hpf   Epithelial Cells (non renal) >10 (A) 0 - 10 /hpf   Bacteria, UA Many (A) None seen/Few  Urinalysis, Complete  Result Value Ref Range   Specific Gravity, UA 1.030 1.005 - 1.030   pH, UA 5.5 5.0 - 7.5   Color, UA Yellow Yellow   Appearance Ur Cloudy (A) Clear   Leukocytes,UA 2+ (A) Negative   Protein,UA Negative Negative/Trace   Glucose, UA Negative Negative    Ketones, UA Negative Negative   RBC, UA Trace (A) Negative   Bilirubin, UA Negative Negative   Urobilinogen, Ur 0.2 0.2 - 1.0 mg/dL   Nitrite, UA Positive (A) Negative   Microscopic Examination See below:    Assessment & Plan:   1. Recurrent UTI UA appears grossly infected today, though it does appear contaminated with squames.  Will start empiric Bactrim and send for culture for further evaluation. - Urinalysis, Complete - CULTURE, URINE COMPREHENSIVE - sulfamethoxazole-trimethoprim (BACTRIM DS) 800-160 MG tablet; Take 1 tablet by mouth 2 (two) times daily for 5 days.  Dispense: 10 tablet; Refill: 0  Return if symptoms worsen or fail to improve.  Debroah Loop, PA-C  [facility] [address] [place], [place] [address] [phone]

## 2022-10-29 LAB — CULTURE, URINE COMPREHENSIVE

## 2022-11-25 ENCOUNTER — Telehealth: Payer: PPO | Admitting: Family Medicine

## 2022-11-25 DIAGNOSIS — R399 Unspecified symptoms and signs involving the genitourinary system: Secondary | ICD-10-CM

## 2022-11-25 MED ORDER — SULFAMETHOXAZOLE-TRIMETHOPRIM 800-160 MG PO TABS: 1.0000 | ORAL_TABLET | Freq: Two times a day (BID) | ORAL | 0 refills | Status: AC

## 2022-11-25 NOTE — Progress Notes (Signed)
E-Visit for Urinary Problems  We are sorry that you are not feeling well.  Here is how we plan to help!  Based on what you shared with me it looks like you most likely have a simple urinary tract infection.  A UTI (Urinary Tract Infection) is a bacterial infection of the bladder.  Most cases of urinary tract infections are simple to treat but a key part of your care is to encourage you to drink plenty of fluids and watch your symptoms carefully.  I have prescribed Bactrim DS One tablet twice a day for 5 days.  Your symptoms should gradually improve. Call us if the burning in your urine worsens, you develop worsening fever, back pain or pelvic pain or if your symptoms do not resolve after completing the antibiotic.  Urinary tract infections can be prevented by drinking plenty of water to keep your body hydrated.  Also be sure when you wipe, wipe from front to back and don't hold it in!  If possible, empty your bladder every 4 hours.  HOME CARE Drink plenty of fluids Compete the full course of the antibiotics even if the symptoms resolve Remember, when you need to go.go. Holding in your urine can increase the likelihood of getting a UTI! GET HELP RIGHT AWAY IF: You cannot urinate You get a high fever Worsening back pain occurs You see blood in your urine You feel sick to your stomach or throw up You feel like you are going to pass out  MAKE SURE YOU  Understand these instructions. Will watch your condition. Will get help right away if you are not doing well or get worse.   Thank you for choosing an e-visit.  Your e-visit answers were reviewed by a board certified advanced clinical practitioner to complete your personal care plan. Depending upon the condition, your plan could have included both over the counter or prescription medications.  Please review your pharmacy choice. Make sure the pharmacy is open so you can pick up prescription now. If there is a problem, you may contact  your provider through [employer] and have the prescription routed to another pharmacy.  Your safety is important to [place]. If you have drug allergies check your prescription carefully.   For the next 24 hours you can use MyChart to ask questions about today's visit, request a non-urgent call back, or ask for a work or school excuse. You will get an email in the next two days asking about your experience. I hope that your e-visit has been valuable and will speed your recovery.   I have provided 5 minutes of non face to face time during this encounter for chart review and documentation.

## 2023-01-09 ENCOUNTER — Ambulatory Visit: Payer: PPO | Admitting: Urology

## 2023-01-13 DIAGNOSIS — H35372 Puckering of macula, left eye: Secondary | ICD-10-CM | POA: Diagnosis not present

## 2023-01-13 DIAGNOSIS — H43812 Vitreous degeneration, left eye: Secondary | ICD-10-CM | POA: Diagnosis not present

## 2023-01-13 DIAGNOSIS — H2513 Age-related nuclear cataract, bilateral: Secondary | ICD-10-CM | POA: Diagnosis not present

## 2023-01-13 NOTE — Progress Notes (Unsigned)
08/18/2022 12:09 PM   Brenda Campbell Ward [place] [birth year] [id number]  Referring provider: Juline Patch, MD [address] [place],  [place] [address]  Urological history: 1. rUTI's -Contributing factors of age, vaginal atrophy, -RUS (09/2021) -  Echogenic foci in both kidneys without posterior shadowing. These may represent nonobstructing stones versus renal sinus fat -KUB (09/2021) - 3 punctate calculi measuring up to 2 mm overlying the upper pole the right kidney. A possible 2 mm calculus overlies the medial interpolar region of the left kidney. No urolithiasis. -cysto (12/2021) - Mild erythematous at trigone with whitish plaques consistent with squamous metasplasia -CTU (08/2022) - Bilateral nephrolithiasis.  -Documented urine urine cultures over the last year  10/26/2022 - E. Coli  08/18/2022 - E. Coli  08/04/2022 - MUF  07/26/2022 - E. Coli -Vaginal estrogen cream   2. Urge incontinence -Contributing factors of age, vaginal atrophy, former smoker, hypertension, anxiety, depression, obesity, arthritis and benzodiazepines  3.  High risk hematuria -Former smoker -CTU (08/2022) - no worrisome findings -cysto (12/2021) - NED -no reports of gross heme -UA ***  Chief Complaint  Patient presents with   Dysuria    HPI: Brenda Campbell is a 72 y.o. female who presents today for yearly follow-up.    PVR ***  UA ***  PMH: Past Medical History:  Diagnosis Date   Anxiety    Arthritis    Blood transfusion without reported diagnosis    Cancer ([place])    small bowel ca   Depression    GERD (gastroesophageal reflux disease)    Hyperlipidemia    Hypertension    Thyroid disease     Surgical History: Past Surgical History:  Procedure Laterality Date   CESAREAN SECTION     x 1   CHOLECYSTECTOMY     COLONOSCOPY  10/12/2014   cleared for 3 years   COLONOSCOPY N/A 03/16/2017   Procedure: COLONOSCOPY;  Surgeon: Lucilla Lame, MD;  Location: [facility]  ENDOSCOPY;  Service: Endoscopy;  Laterality: N/A;   COLONOSCOPY WITH PROPOFOL N/A 12/01/2017   Procedure: COLONOSCOPY WITH PROPOFOL;  Surgeon: [name], MD;  Location: [facility] ENDOSCOPY;  Service: Gastroenterology;  Laterality: N/A;   COLONOSCOPY WITH PROPOFOL N/A 04/12/2021   Procedure: COLONOSCOPY WITH PROPOFOL;  Surgeon: [name], MD;  Location: [facility] ENDOSCOPY;  Service: Gastroenterology;  Laterality: N/A;   ESOPHAGOGASTRODUODENOSCOPY (EGD) WITH PROPOFOL N/A 03/15/2017   Procedure: ESOPHAGOGASTRODUODENOSCOPY (EGD) WITH PROPOFOL;  Surgeon: [name], MD;  Location: [facility] ENDOSCOPY;  Service: Endoscopy;  Laterality: N/A;   ESOPHAGOGASTRODUODENOSCOPY (EGD) WITH PROPOFOL N/A 03/30/2017   Procedure: ESOPHAGOGASTRODUODENOSCOPY (EGD) WITH PROPOFOL;  Surgeon: [name], MD;  Location: [facility] ENDOSCOPY;  Service: Endoscopy;  Laterality: N/A;  Endoscopic Capsule Placement   ESOPHAGOGASTRODUODENOSCOPY (EGD) WITH PROPOFOL N/A 12/01/2017   Procedure: ESOPHAGOGASTRODUODENOSCOPY (EGD) WITH PROPOFOL;  Surgeon: [name], MD;  Location: [facility] ENDOSCOPY;  Service: Gastroenterology;  Laterality: N/A;   ESOPHAGOGASTRODUODENOSCOPY (EGD) WITH PROPOFOL N/A 04/12/2021   Procedure: ESOPHAGOGASTRODUODENOSCOPY (EGD) WITH PROPOFOL;  Surgeon: [name], MD;  Location: [facility] ENDOSCOPY;  Service: Gastroenterology;  Laterality: N/A;   FRACTURE SURGERY     GALLBLADDER SURGERY     GIVENS CAPSULE STUDY N/A 03/17/2017   Procedure: GIVENS CAPSULE STUDY;  Surgeon: Lucilla Lame, MD;  Location: [facility] ENDOSCOPY;  Service: Endoscopy;  Laterality: N/A;   GIVENS CAPSULE STUDY N/A 12/01/2017   Procedure: GIVENS CAPSULE STUDY, 12 HOUR;  Surgeon: [name], MD;  Location: [facility] ENDOSCOPY;  Service: Gastroenterology;  Laterality: N/A;   SMALL INTESTINE  SURGERY     THROAT SURGERY     nodule on vocal chord   TIBIA FRACTURE SURGERY Right    WRIST SURGERY Right    arthritis    Home Medications:  Allergies as of 08/18/2022   No Known  Allergies      Medication List        Accurate as of August 18, 2022 12:09 PM. If you have any questions, ask your nurse or doctor.          ALPRAZolam 0.25 MG tablet Commonly known as: XANAX Take 1 tablet (0.25 mg total) by mouth as needed.   levothyroxine 50 MCG tablet Commonly known as: SYNTHROID Take 1 tablet (50 mcg total) by mouth daily.   nystatin cream Commonly known as: MYCOSTATIN Apply 1 Application topically 2 (two) times daily.   omeprazole 20 MG capsule Commonly known as: PRILOSEC Take 1 capsule (20 mg total) by mouth 2 (two) times daily.   Premarin vaginal cream Generic drug: conjugated estrogens Estrogen Cream Instruction Discard applicator Apply pea sized amount to tip of finger to urethra before bed. Wash hands well after application. Use Monday, Wednesday and Friday   sertraline 50 MG tablet Commonly known as: ZOLOFT Take 1 tablet (50 mg total) by mouth daily.   sulfamethoxazole-trimethoprim 800-160 MG tablet Commonly known as: BACTRIM DS Take 1 tablet by mouth every 12 (twelve) hours.        Allergies: No Known Allergies  Family History: Family History  Problem Relation Age of Onset   Diabetes Mother    Heart disease Mother    Diabetes Brother    Breast cancer Neg Hx     Social History:  reports that she has quit smoking. She has never used smokeless tobacco. She reports current alcohol use of about 9.0 standard drinks of alcohol per week. She reports that she does not use drugs.  ROS: Pertinent ROS in HPI  Physical Exam: BP (!) 166/99   Pulse 69   Ht '5\' 7"'$  (1.702 m)   Wt 220 lb (99.8 kg)   BMI 34.46 kg/m   Constitutional:  Well nourished. Alert and oriented, No acute distress. HEENT: [place] AT, moist mucus membranes.  Trachea midline, no masses. Cardiovascular: No clubbing, cyanosis, or edema. Respiratory: Normal respiratory effort, no increased work of breathing. GU: No CVA tenderness.  No bladder fullness or masses.  Vulvovaginal atrophy w/ pallor, loss of rugae, introital retraction, excoriations.  Vulvar thinning, fusion of labia, clitoral hood retraction, prominent urethral meatus.   *** external genitalia, *** pubic hair distribution, no lesions.  Normal urethral meatus, no lesions, no prolapse, no discharge.   No urethral masses, tenderness and/or tenderness. No bladder fullness, tenderness or masses. *** vagina mucosa, *** estrogen effect, no discharge, no lesions, *** pelvic support, *** cystocele and *** rectocele noted.  No cervical motion tenderness.  Uterus is freely mobile and non-fixed.  No adnexal/parametria masses or tenderness noted.  Anus and perineum are without rashes or lesions.   ***  Neurologic: Grossly intact, no focal deficits, moving all 4 extremities. Psychiatric: Normal mood and affect.    Laboratory Data: Urinalysis See Epic and HPI I have reviewed the labs.   Pertinent Imaging: ***  Assessment & Plan:    1. rUTI's  - criteria for recurrent UTI has been met with 2 or more infections in 6 months or 3 or greater infections in one year ***  - patient is instructed to increase their water intake until the urine is pale  yellow or clear (10 to 12 cups daily) ***  - patient is instructed to take probiotics (yogurt, oral pills or vaginal suppositories), take cranberry pills or drink the juice and Vitamin C 1,000 mg daily to acidify the urine ***  - if using tampons, she should remove them prior to urinating and change them often ***  - avoid soaking in tubs and wipe front to back after urinating ***  - benefit from core strengthening exercises has been seen.  We can refer to PT if they desire ***  - advised them to have CATH UA's for urinalysis and culture to prevent skin, vaginal and/or rectal contamination of the specimen  - reviewed symptoms of UTI (fevers, chills, gross hematuria, mental status changes, dysuria, suprapubic pain, back pain and/or sudden worsening of urinary symptoms)  and advised not to have urine checked or be treated for UTI if not experiencing symptoms  - discussed antibiotic stewardship with the patient - explained the risk of increasing risk of antibiotic resistance with continuous exposure to antibiotics, renal failure, hypoglycemia, C. Diff infection, allergic reactions, etc.    2. Urge incontinence  - offered behavioral therapies  - bladder training  - bladder control strategies  - pelvic floor muscle training  - fluid management   - offered medical therapy with anticholinergic therapy or beta-3 adrenergic receptor agonist and the potential side effects of each therapy ***  - offered refer to gynecology for a pessary fitting ***  - offered an appointment with one of our surgeon for a possible pelvic sling procedure ***  - would like to try the beta-3 adrenergic receptor agonist (Myrbetriq).  Given Myrbetriq 25 mg samples, #28.  I have reviewed with the patient of the side effects of Myrbetriq, such as: elevation in BP, urinary retention and/or HA.  She will return in one month for PVR and symptom recheck.  ***  - would like to try anticholinergic therapy.  Given Vesicare '5mg'$ /'10mg'$ , Toviaz '4mg'$ /'8mg'$  samples, # 28.   Advised of the side effects, such as: Dry eyes, dry mouth, constipation, mental confusion and/or urinary retention. ***  - RTC in 3 weeks for PVR and symptom recheck ***  3. High risk hematuria -former smoker -work up 2023 - no worrisome findings -no reports of gross heme -UA ***  4.  Nephrolithiasis    Return for keep CT appointment .  These notes generated with voice recognition software. I apologize for typographical errors.  [place], [place] [address]  [place], [place] [address] [phone]

## 2023-01-16 ENCOUNTER — Other Ambulatory Visit
Admission: RE | Admit: 2023-01-16 | Discharge: 2023-01-16 | Disposition: A | Discharge status: Home / Self Care | Payer: PPO | Attending: Urology | Admitting: Urology

## 2023-01-16 ENCOUNTER — Other Ambulatory Visit: Payer: Self-pay

## 2023-01-16 ENCOUNTER — Ambulatory Visit: Payer: PPO | Admitting: Urology

## 2023-01-16 ENCOUNTER — Encounter: Payer: Self-pay | Admitting: Urology

## 2023-01-16 VITALS — BP 151/83 | HR 95 | Ht 67.0 in | Wt 220.0 lb

## 2023-01-16 DIAGNOSIS — N3941 Urge incontinence: Secondary | ICD-10-CM

## 2023-01-16 DIAGNOSIS — B3731 Acute candidiasis of vulva and vagina: Secondary | ICD-10-CM

## 2023-01-16 DIAGNOSIS — N952 Postmenopausal atrophic vaginitis: Secondary | ICD-10-CM

## 2023-01-16 DIAGNOSIS — N390 Urinary tract infection, site not specified: Secondary | ICD-10-CM | POA: Diagnosis not present

## 2023-01-16 DIAGNOSIS — N200 Calculus of kidney: Secondary | ICD-10-CM | POA: Diagnosis not present

## 2023-01-16 DIAGNOSIS — Z87440 Personal history of urinary (tract) infections: Secondary | ICD-10-CM | POA: Diagnosis not present

## 2023-01-16 LAB — URINALYSIS, COMPLETE (UACMP) WITH MICROSCOPIC
Bilirubin Urine: NEGATIVE
Glucose, UA: NEGATIVE mg/dL
Ketones, ur: NEGATIVE mg/dL
Nitrite: NEGATIVE
Protein, ur: NEGATIVE mg/dL
Specific Gravity, Urine: 1.02 (ref 1.005–1.030)
pH: 5.5 (ref 5.0–8.0)

## 2023-01-16 LAB — BLADDER SCAN AMB NON-IMAGING

## 2023-01-16 MED ORDER — NYSTATIN 100000 UNIT/GM EX CREA: 1.0000 | TOPICAL_CREAM | Freq: Two times a day (BID) | CUTANEOUS | 0 refills | Status: DC

## 2023-01-16 MED ORDER — GEMTESA 75 MG PO TABS: 75.0000 mg | ORAL_TABLET | Freq: Every day | ORAL | 0 refills | Status: DC

## 2023-01-18 ENCOUNTER — Telehealth: Payer: Self-pay | Admitting: Family Medicine

## 2023-01-18 NOTE — Telephone Encounter (Signed)
Patient called stating there is a side effect on Gemtesa she read on line that it can increase the risk for UTI. I informed her that we don't have this complaint. We prescribe this medication for a lot of our patients and this is not a complaint that I have heard of. Patient voiced understanding.

## 2023-02-03 ENCOUNTER — Encounter: Payer: Self-pay | Admitting: Family Medicine

## 2023-02-03 ENCOUNTER — Other Ambulatory Visit: Payer: Self-pay

## 2023-02-03 ENCOUNTER — Ambulatory Visit (INDEPENDENT_AMBULATORY_CARE_PROVIDER_SITE_OTHER): Payer: PPO | Admitting: Family Medicine

## 2023-02-03 ENCOUNTER — Telehealth: Payer: Self-pay | Admitting: Family Medicine

## 2023-02-03 VITALS — BP 120/76 | HR 80 | Temp 98.7°F | Ht 67.0 in | Wt 225.0 lb

## 2023-02-03 DIAGNOSIS — J029 Acute pharyngitis, unspecified: Secondary | ICD-10-CM | POA: Diagnosis not present

## 2023-02-03 DIAGNOSIS — R051 Acute cough: Secondary | ICD-10-CM | POA: Diagnosis not present

## 2023-02-03 DIAGNOSIS — R509 Fever, unspecified: Secondary | ICD-10-CM

## 2023-02-03 DIAGNOSIS — R6889 Other general symptoms and signs: Secondary | ICD-10-CM

## 2023-02-03 DIAGNOSIS — U071 COVID-19: Secondary | ICD-10-CM | POA: Diagnosis not present

## 2023-02-03 LAB — POCT INFLUENZA A/B
Influenza A, POC: NEGATIVE
Influenza B, POC: NEGATIVE

## 2023-02-03 LAB — POC COVID19 BINAXNOW: SARS Coronavirus 2 Ag: POSITIVE — AB

## 2023-02-03 MED ORDER — GUAIFENESIN-CODEINE 100-10 MG/5ML PO SYRP: 5.0000 mL | ORAL_SOLUTION | Freq: Three times a day (TID) | ORAL | 0 refills | Status: DC | PRN

## 2023-02-03 MED ORDER — PROMETHAZINE-DM 6.25-15 MG/5ML PO SYRP: 5.0000 mL | ORAL_SOLUTION | Freq: Four times a day (QID) | ORAL | 0 refills | Status: DC | PRN

## 2023-02-03 MED ORDER — MOLNUPIRAVIR EUA 200MG CAPSULE: 4.0000 | ORAL_CAPSULE | Freq: Two times a day (BID) | ORAL | 0 refills | Status: AC

## 2023-02-03 NOTE — Telephone Encounter (Signed)
Pt stated the pharmacy advised her that they don't have the medication guaiFENesin-codeine (ROBITUSSIN AC) 100-10 MG/5ML syrup and an alternative is needed. Pharmacy doesn't know when they will have this medication in stock.   Please advise.  [id number] Phone: [phone]: [phone] Hours: Not open 24 hours

## 2023-02-03 NOTE — Progress Notes (Signed)
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 The current episode started in the past 7 days (Wednesday). The problem has been waxing and waning. The cough is Non-productive. Associated symptoms include chills, a fever, myalgias, postnasal drip and a sore throat. Pertinent negatives include no chest pain, headaches, rhinorrhea, shortness of breath or wheezing. The symptoms are aggravated by pollens.  Fever  Associated symptoms include coughing and a sore throat. Pertinent negatives include no chest pain, headaches or wheezing. The treatment provided moderate relief.    Lab Results  Component Value Date   NA 140 09/07/2021   K 3.9 09/07/2021   CO2 24 09/07/2021   GLUCOSE 104 (H) 09/07/2021   BUN 10 09/07/2021   CREATININE 0.60 08/29/2022   CALCIUM 8.6 (L) 09/07/2021   EGFR 95 09/07/2021   GFRNONAA 90 10/05/2020   Lab Results  Component Value Date   CHOL 209 (H) 03/07/2022   HDL 68 03/07/2022   LDLCALC 126 (H) 03/07/2022   TRIG 85 03/07/2022   CHOLHDL 4.3 10/22/2018   Lab Results  Component Value Date   TSH 2.030 03/07/2022   No results found for: "HGBA1C" Lab Results  Component Value Date   WBC 5.7 02/14/2020   HGB 14.5 09/07/2021   HCT 42.4 02/14/2020   MCV 98 (H) 02/14/2020   PLT 248 02/14/2020   Lab Results  Component Value Date   ALT 13 (L) 03/15/2017   AST 17 03/15/2017   ALKPHOS 54 03/15/2017   BILITOT 0.5 03/15/2017   No results found for: "25OHVITD2", "25OHVITD3", "VD25OH"   Review of Systems  Constitutional:  Positive for chills, diaphoresis, fatigue and fever.  HENT:  Positive for postnasal drip and sore throat. Negative for rhinorrhea.   Respiratory:  Positive for cough. Negative for shortness of breath and wheezing.   Cardiovascular:  Negative for chest pain, palpitations and leg swelling.   Musculoskeletal:  Positive for myalgias.  Neurological:  Negative for headaches.    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   Gastroesophageal reflux disease 12/22/2014   Hypothyroidism 12/22/2014   Obesity 12/22/2014   Tachycardia 12/22/2014   Malaise 12/22/2014    No Known Allergies  Past Surgical History:  Procedure Laterality Date   CESAREAN SECTION     x 1   CHOLECYSTECTOMY     COLONOSCOPY  10/12/2014   cleared for 3 years   COLONOSCOPY N/A 03/16/2017   Procedure: COLONOSCOPY;  Surgeon: Lucilla Lame, MD;  Location: [facility] ENDOSCOPY;  Service: Endoscopy;  Laterality: N/A;   COLONOSCOPY WITH PROPOFOL N/A 12/01/2017   Procedure: COLONOSCOPY WITH PROPOFOL;  Surgeon: [name], MD;  Location: [facility] ENDOSCOPY;  Service: Gastroenterology;  Laterality: N/A;   COLONOSCOPY WITH PROPOFOL N/A 04/12/2021   Procedure: COLONOSCOPY WITH PROPOFOL;  Surgeon: [name], MD;  Location: [facility] ENDOSCOPY;  Service: Gastroenterology;  Laterality: N/A;   ESOPHAGOGASTRODUODENOSCOPY (EGD) WITH PROPOFOL N/A 03/15/2017   Procedure:  ESOPHAGOGASTRODUODENOSCOPY (EGD) WITH PROPOFOL;  Surgeon: [name], MD;  Location: [facility] ENDOSCOPY;  Service: Endoscopy;  Laterality: N/A;   ESOPHAGOGASTRODUODENOSCOPY (EGD) WITH PROPOFOL N/A 03/30/2017   Procedure: ESOPHAGOGASTRODUODENOSCOPY (EGD) WITH PROPOFOL;  Surgeon: [name], MD;  Location: [facility] ENDOSCOPY;  Service: Endoscopy;  Laterality: N/A;  Endoscopic Capsule Placement   ESOPHAGOGASTRODUODENOSCOPY (EGD) WITH PROPOFOL N/A  12/01/2017   Procedure: ESOPHAGOGASTRODUODENOSCOPY (EGD) WITH PROPOFOL;  Surgeon: [name], MD;  Location: [facility] ENDOSCOPY;  Service: Gastroenterology;  Laterality: N/A;   ESOPHAGOGASTRODUODENOSCOPY (EGD) WITH PROPOFOL N/A 04/12/2021   Procedure: ESOPHAGOGASTRODUODENOSCOPY (EGD) WITH PROPOFOL;  Surgeon: [name], MD;  Location: [facility] ENDOSCOPY;  Service: Gastroenterology;  Laterality: N/A;   FRACTURE SURGERY     GALLBLADDER SURGERY     GIVENS CAPSULE STUDY N/A 03/17/2017   Procedure: GIVENS CAPSULE STUDY;  Surgeon: Lucilla Lame, MD;  Location: [facility] ENDOSCOPY;  Service: Endoscopy;  Laterality: N/A;   GIVENS CAPSULE STUDY N/A 12/01/2017   Procedure: GIVENS CAPSULE STUDY, 12 HOUR;  Surgeon: [name], MD;  Location: [facility] ENDOSCOPY;  Service: Gastroenterology;  Laterality: N/A;   SMALL INTESTINE SURGERY     THROAT SURGERY     nodule on vocal chord   TIBIA FRACTURE SURGERY Right    WRIST SURGERY Right    arthritis    Social History   Tobacco Use   Smoking status: Former   Smokeless tobacco: Never  Vaping Use   Vaping Use: Never used  Substance Use Topics   Alcohol use: Yes    Alcohol/week: 9.0 standard drinks of alcohol    Types: 4 Cans of beer, 5 Shots of liquor per week   Drug use: No     Medication list has been reviewed and updated.  Current Meds  Medication Sig   ALPRAZolam (XANAX) 0.25 MG tablet Take 1 tablet (0.25 mg total) by mouth as needed.   conjugated estrogens (PREMARIN) vaginal cream Estrogen Cream Instruction Discard applicator Apply pea sized amount to tip of finger to urethra before bed. Wash hands well after application. Use Monday, Wednesday and Friday   levothyroxine (SYNTHROID) 50 MCG tablet Take 1 tablet (50 mcg total) by mouth daily.   nystatin cream (MYCOSTATIN) Apply 1 Application topically 2 (two) times daily.   omeprazole (PRILOSEC) 20 MG capsule Take 1 capsule (20 mg total) by mouth 2 (two) times daily.   sertraline (ZOLOFT) 50 MG tablet Take 1  tablet (50 mg total) by mouth daily.   Vibegron (GEMTESA) 75 MG TABS Take 1 tablet (75 mg total) by mouth daily.       02/03/2023   10:06 AM 09/07/2022    9:34 AM 03/07/2022   10:18 AM 09/07/2021   10:07 AM  GAD 7 : Generalized Anxiety Score  Nervous, Anxious, on Edge 0 0 0 0  Control/stop worrying 0 0 0 0  Worry too much - different things 0 0 0 0  Trouble relaxing 0 0 0 0  Restless 0 0 0 0  Easily annoyed or irritable 0 0 0 0  Afraid - awful might happen 0 0 0 0  Total GAD 7 Score 0 0 0 0  Anxiety Difficulty Not difficult at all Not difficult at all Not difficult at all        02/03/2023   10:06 AM 10/26/2022    3:18 PM 09/07/2022    9:34 AM  Depression screen PHQ 2/9  Decreased Interest 0 0 0  Down, Depressed, Hopeless 0 0 0  PHQ - 2 Score 0 0 0  Altered sleeping 0 0 0  Tired, decreased energy 0 0 0  Change in appetite 0 0 0  Feeling bad or failure about yourself  0 0 0  Trouble concentrating 0 0 0  Moving slowly or fidgety/restless 0 0 0  Suicidal thoughts 0 0 0  PHQ-9 Score 0 0 0  Difficult doing work/chores Not difficult at all Not difficult at all Not difficult at all    BP Readings from Last 3 Encounters:  02/03/23 120/76  01/16/23 (!) 151/83  10/26/22 138/83    Physical Exam Vitals and nursing note reviewed. Exam conducted with a chaperone present.  Constitutional:      General: She is not in acute distress.    Appearance: She is not diaphoretic.  HENT:     Head: Normocephalic and atraumatic.     Right Ear: External ear normal.     Left Ear: External ear normal.     Nose: Nose normal. No congestion or rhinorrhea.     Mouth/Throat:     Mouth: Mucous membranes are moist.     Pharynx: No oropharyngeal exudate or posterior oropharyngeal erythema.  Eyes:     General:        Right eye: No discharge.        Left eye: No discharge.     Conjunctiva/sclera: Conjunctivae normal.     Pupils: Pupils are equal, round, and reactive to light.  Neck:     Thyroid:  No thyromegaly.     Vascular: No JVD.  Cardiovascular:     Rate and Rhythm: Normal rate and regular rhythm.     Heart sounds: Normal heart sounds. No murmur heard.    No friction rub. No gallop.  Pulmonary:     Effort: Pulmonary effort is normal.     Breath sounds: Normal breath sounds.  Abdominal:     General: Bowel sounds are normal.     Palpations: Abdomen is soft. There is no mass.     Tenderness: There is no abdominal tenderness. There is no guarding.  Musculoskeletal:        General: Normal range of motion.     Cervical back: Neck supple.  Lymphadenopathy:     Cervical: No cervical adenopathy.  Skin:    General: Skin is warm and dry.  Neurological:     Mental Status: She is alert.     Wt Readings from Last 3 Encounters:  02/03/23 225 lb (102.1 kg)  01/16/23 220 lb (99.8 kg)  10/26/22 220 lb (99.8 kg)    BP 120/76   Pulse 80   Temp 98.7 F (37.1 C) (Oral)   Ht '5\' 7"'$  (1.702 m)   Wt 225 lb (102.1 kg)   SpO2 95%   BMI 35.24 kg/m   Assessment and Plan:  1. Flu-like symptoms Patient with flulike symptoms since earlier in the week.  Patient's husband has been similar symptomatology patient has low-grade fever by history with myalgias and upper respiratory symptoms we will check influenza and COVID - POCT Influenza A/B - POC COVID-19  2. COVID We did is positive patient has risk factors of age and weight.  We will treat with molnupiravir 200 mg 4 capsules twice a day for 5 days. - molnupiravir EUA (LAGEVRIO) 200 mg CAPS capsule; Take 4 capsules (800 mg total) by mouth 2 (two) times daily for 5 days.  Dispense: 40 capsule; Refill: 0  3. Acute cough Wants something strong for the cough and we will try to see if  we can get Robitussin AC but I if unable to obtain her neck step will be promethazine with dextromethorphan. - guaiFENesin-codeine (ROBITUSSIN AC) 100-10 MG/5ML syrup; Take 5 mLs by mouth 3 (three) times daily as needed for cough.  Dispense: 118 mL; Refill: 0     Otilio Miu, MD

## 2023-02-03 NOTE — Patient Instructions (Signed)
COVID-19 COVID-19 is an infection caused by a virus called SARS-CoV-2. Most people who get COVID-19 have mild to moderate symptoms. Some have little to no symptoms. In others, the virus may cause a severe infection. What are the causes? COVID-19 is caused by a coronavirus. The virus may be in the air as droplets or as tiny specks of fluid (aerosols). It may also be on surfaces. You may catch the virus if you: Breathe in droplets when a person with COVID-19 breathes, speaks, sings, coughs, or sneezes. Touch something that has the virus on it and then touch your mouth, nose, or eyes. What increases the risk? Risk for infection: You are more likely to get COVID-19 if: You are within 6 ft (1.8 m) of a person who has COVID-19 for 15 minutes or longer. You provide care to a person who has COVID-19. You are in close contact with others. This includes hugging, kissing, or sharing utensils. Risk for serious illness caused by COVID-19: You are more likely to get very ill from COVID-19 if: You have cancer. You have a long-term (chronic) disease. This may be: A chronic lung disease, such as pulmonary embolism, chronic obstructive pulmonary disease (COPD), or cystic fibrosis. A disease that affects your body's defense system (immune system). If you have a weak immune system, you are said to be immunocompromised. A serious heart condition, such as heart failure, coronary artery disease, or cardiomyopathy. Diabetes. Chronic kidney disease. A liver disease, such as cirrhosis, nonalcoholic fatty liver disease, alcoholic liver disease, or autoimmune hepatitis. You are obese. You are pregnant or were just pregnant. You have sickle cell disease. What are the signs or symptoms? Symptoms of COVID-19 can range from mild to severe. They may appear any time from 2 to 14 days after you are exposed. They include: Fever or chills. Shortness of breath or trouble breathing. Feeling tired. Headaches, body aches, or  muscle aches. A runny or stuffy nose. Sneezing, coughing, or a sore throat. New loss of taste or smell. You may also have stomach problems, such as nausea, vomiting, or diarrhea. In some cases, you may not have any symptoms. How is this diagnosed? COVID-19 may be diagnosed by testing a sample to check for the virus. The most common tests are the PCR test and the antigen test. Tests may be done in the lab or at home. They include: Using a swab to take a sample of fluid from your nose. Testing a sample of saliva from your mouth. Testing a sample of mucus from your lungs (sputum). How is this treated? Treatment for COVID-19 depends on how severe your condition is. Mild symptoms can be treated at home. You should rest, drink fluids, and take over-the-counter medicine. If you have symptoms and risk factors, you may be prescribed a medicine that fights viruses (antiviral). Severe symptoms may be treated in a hospital intensive care unit (ICU). Treatment may include: Extra oxygen given through a tube in the nose, a face mask, or a hood. Medicines. These may include: Antivirals, such as remdesivir. Anti-inflammatories, such as corticosteroids. These help reduce inflammation. Antithrombotics. These help prevent or treat blood clots. Convalescent plasma. This helps boost your immune system. Prone positioning. This is when you are laid on your stomach to help oxygen get into your lungs. Infection control measures. If you are at risk for a more serious illness, your health care provider may prescribe two medicines to help your immune system protect you. These are called long-acting monoclonal antibodies. They are given together  every 6 months. How is this prevented? To protect yourself: Get the vaccine or vaccine series if you meet the guidelines. You can even get the vaccine while you are pregnant or making breast milk (lactating). Get an added dose of the vaccine if you are immunocompromised. This  applies if you have had an organ transplant or if you have a condition that affects your immune system. You should get the added dose 4 weeks after you got the first one. If you get an mRNA vaccine, you will need to get 3 doses. Talk to your provider about getting experimental monoclonal antibodies. This treatment can help prevent severe illness. It may be given to you if: You are immunocompromised. You cannot get the vaccine. You may not get the vaccine if you have a severe allergic reaction to it or to what it is made of. You are not fully vaccinated. You are in a place where there is COVID-19 and: You are in close contact with someone who has COVID-19. You are at high risk of being exposed. You are at risk of illness from new variants of the virus. To protect others: If you have symptoms of COVID-19, take steps to stop the virus from spreading. Stay home. Leave your house only to get medical care. Do not use public transit. Do not travel while you are sick. Wash your hands often with soap and water for at least 20 seconds. If soap and water are not available, use alcohol-based hand sanitizer. Make sure that all people in your household wash their hands well and often. Cough or sneeze into a tissue or your sleeve or elbow. Do not cough or sneeze into your hand or into the air. Where to find more information Centers for Disease Control and Prevention (CDC): [employer].com.cy World Health Organization [facility]): http://curry.org/ Get help right away if: You have trouble breathing. You have pain or pressure in your chest. You are confused. Your lips or fingernails turn blue. You have trouble waking from sleep. Your symptoms get worse. These symptoms may be an emergency. Get help right away. Call 911. Do not wait to see if the symptoms will go away. Do not drive yourself to the hospital. This information is not intended to replace advice given to you by your health care provider. Make sure you discuss any  questions you have with your health care provider. Document Revised: 08/12/2022 Document Reviewed: 08/12/2022 Elsevier Patient Education  [place].

## 2023-02-07 ENCOUNTER — Other Ambulatory Visit: Payer: Self-pay

## 2023-02-24 NOTE — Progress Notes (Deleted)
01/16/2023 2:01 PM   [employer] [birth year] [id number]  Referring provider: Juline Patch, MD [address] [place],  [place] [address]  Urological history: 1. rUTI's -Contributing factors of age, vaginal atrophy, -RUS (09/2021) -  Echogenic foci in both kidneys without posterior shadowing. These may represent nonobstructing stones versus renal sinus fat -KUB (09/2021) - 3 punctate calculi measuring up to 2 mm overlying the upper pole the right kidney. A possible 2 mm calculus overlies the medial interpolar region of the left kidney. No urolithiasis. -cysto (12/2021) - Mild erythematous at trigone with whitish plaques consistent with squamous metasplasia -CTU (08/2022) - Bilateral nephrolithiasis.  -Documented urine urine cultures over the last year  10/26/2022 - E. Coli  08/18/2022 - E. Coli  08/04/2022 - MUF  07/26/2022 - E. Coli -Vaginal estrogen cream   2. Urge incontinence -Contributing factors of age, vaginal atrophy, former smoker, hypertension, anxiety, depression, obesity, arthritis and benzodiazepines  3.  High risk hematuria -Former smoker -CTU (08/2022) - no worrisome findings -cysto (12/2021) - NED -no reports of gross heme -UA negative for microscopic hematuria  Chief Complaint  Patient presents with   Urinary Incontinence    HPI: Brenda Campbell is a 72 y.o. female who presents today for 6-week follow-up after trial of Gemtesa.  At her visit on 01/16/2023, she has 1-7 daytime urinations, with nocturia x 3 or more.  With a strong urge to urinate.  She has urge incontinence.  She does engage in toilet mapping.  She wears incontinence pads if she is going to be out at any events or locations where a restroom is not readily available.  She continues to have struggles with vaginal dryness and vaginal itching as well.  She is applying the vaginal estrogen cream 3 nights weekly, she is taking probiotics, she is taking cranberry tablets  and d-mannose.  She tries to get in 32 ounces or more of water daily, but admits it is difficult.  PVR 13 mL.  UA yellow hazy, specific gravity 1.020, pH 5.5, trace hemoglobin, moderate leukocytes, squamous epithelial cells 11-20, 11-20 WBCs, 0-5 RBCs and many bacteria.  PVR ***  PMH: Past Medical History:  Diagnosis Date   Anxiety    Arthritis    Blood transfusion without reported diagnosis    Cancer ([place])    small bowel ca   Depression    GERD (gastroesophageal reflux disease)    Hyperlipidemia    Hypertension    Thyroid disease     Surgical History: Past Surgical History:  Procedure Laterality Date   CESAREAN SECTION     x 1   CHOLECYSTECTOMY     COLONOSCOPY  10/12/2014   cleared for 3 years   COLONOSCOPY N/A 03/16/2017   Procedure: COLONOSCOPY;  Surgeon: Lucilla Lame, MD;  Location: [facility] ENDOSCOPY;  Service: Endoscopy;  Laterality: N/A;   COLONOSCOPY WITH PROPOFOL N/A 12/01/2017   Procedure: COLONOSCOPY WITH PROPOFOL;  Surgeon: [name], MD;  Location: [facility] ENDOSCOPY;  Service: Gastroenterology;  Laterality: N/A;   COLONOSCOPY WITH PROPOFOL N/A 04/12/2021   Procedure: COLONOSCOPY WITH PROPOFOL;  Surgeon: [name], MD;  Location: [facility] ENDOSCOPY;  Service: Gastroenterology;  Laterality: N/A;   ESOPHAGOGASTRODUODENOSCOPY (EGD) WITH PROPOFOL N/A 03/15/2017   Procedure: ESOPHAGOGASTRODUODENOSCOPY (EGD) WITH PROPOFOL;  Surgeon: [name], MD;  Location: [facility] ENDOSCOPY;  Service: Endoscopy;  Laterality: N/A;   ESOPHAGOGASTRODUODENOSCOPY (EGD) WITH PROPOFOL N/A 03/30/2017   Procedure: ESOPHAGOGASTRODUODENOSCOPY (EGD) WITH PROPOFOL;  Surgeon: [name], MD;  Location: [facility]  ENDOSCOPY;  Service: Endoscopy;  Laterality: N/A;  Endoscopic Capsule Placement   ESOPHAGOGASTRODUODENOSCOPY (EGD) WITH PROPOFOL N/A 12/01/2017   Procedure: ESOPHAGOGASTRODUODENOSCOPY (EGD) WITH PROPOFOL;  Surgeon: [name], MD;  Location[facility] ENDOSCOPY;  Service: Gastroenterology;  Laterality: N/A;    ESOPHAGOGASTRODUODENOSCOPY (EGD) WITH PROPOFOL N/A 04/12/2021   Procedure: ESOPHAGOGASTRODUODENOSCOPY (EGD) WITH PROPOFOL;  Surgeon: [name], MD;  Location: [facility] ENDOSCOPY;  Service: Gastroenterology;  Laterality: N/A;   FRACTURE SURGERY     GALLBLADDER SURGERY     GIVENS CAPSULE STUDY N/A 03/17/2017   Procedure: GIVENS CAPSULE STUDY;  Surgeon: Lucilla Lame, MD;  Location: [facility] ENDOSCOPY;  Service: Endoscopy;  Laterality: N/A;   GIVENS CAPSULE STUDY N/A 12/01/2017   Procedure: GIVENS CAPSULE STUDY, 12 HOUR;  Surgeon: [name], MD;  Location: [facility] ENDOSCOPY;  Service: Gastroenterology;  Laterality: N/A;   SMALL INTESTINE SURGERY     THROAT SURGERY     nodule on vocal chord   TIBIA FRACTURE SURGERY Right    WRIST SURGERY Right    arthritis    Home Medications:  Allergies as of 01/16/2023   No Known Allergies      Medication List        Accurate as of January 16, 2023  2:01 PM. If you have any questions, ask your nurse or doctor.          ALPRAZolam 0.25 MG tablet Commonly known as: XANAX Take 1 tablet (0.25 mg total) by mouth as needed.   Gemtesa 75 MG Tabs Generic drug: Vibegron Take 1 tablet (75 mg total) by mouth daily. Started by: Zara Council, PA-C   levothyroxine 50 MCG tablet Commonly known as: SYNTHROID Take 1 tablet (50 mcg total) by mouth daily.   nystatin cream Commonly known as: MYCOSTATIN Apply 1 Application topically 2 (two) times daily.   omeprazole 20 MG capsule Commonly known as: PRILOSEC Take 1 capsule (20 mg total) by mouth 2 (two) times daily.   Premarin vaginal cream Generic drug: conjugated estrogens Estrogen Cream Instruction Discard applicator Apply pea sized amount to tip of finger to urethra before bed. Wash hands well after application. Use Monday, Wednesday and Friday   sertraline 50 MG tablet Commonly known as: ZOLOFT Take 1 tablet (50 mg total) by mouth daily.        Allergies: No Known Allergies  Family  History: Family History  Problem Relation Age of Onset   Diabetes Mother    Heart disease Mother    Diabetes Brother    Breast cancer Neg Hx     Social History:  reports that she has quit smoking. She has never used smokeless tobacco. She reports current alcohol use of about 9.0 standard drinks of alcohol per week. She reports that she does not use drugs.  ROS: Pertinent ROS in HPI  Physical Exam: BP (!) 151/83 (BP Location: Left Arm, Patient Position: Sitting, Cuff Size: Large)   Pulse 95   Ht 5\' 7"  (1.702 m)   Wt 220 lb (99.8 kg)   BMI 34.46 kg/m   Constitutional:  Well nourished. Alert and oriented, No acute distress. HEENT: [place] AT, moist mucus membranes.  Trachea midline Cardiovascular: No clubbing, cyanosis, or edema. Respiratory: Normal respiratory effort, no increased work of breathing. Neurologic: Grossly intact, no focal deficits, moving all 4 extremities. Psychiatric: Normal mood and affect.    Laboratory Data: Urinalysis See Epic and HPI I have reviewed the labs.   Pertinent Imaging: ***   Assessment & Plan:    1. rUTI's -***  2. Urge incontinence -***  3. High risk hematuria -former smoker -work up 2023 - no worrisome findings -no reports of gross heme  4.  Nephrolithiasis -Bilateral nephrolithiasis on CT -No symptoms of renal colic -Follow clinically  Return in about 6 weeks (around 02/27/2023) for PVR and OAB questionnaire.  These notes generated with voice recognition software. I apologize for typographical errors.  [place], [place] [address]  [place], [place] [address] [phone]

## 2023-02-27 ENCOUNTER — Ambulatory Visit: Payer: PPO | Admitting: Urology

## 2023-02-27 DIAGNOSIS — N3941 Urge incontinence: Secondary | ICD-10-CM

## 2023-03-08 ENCOUNTER — Ambulatory Visit (INDEPENDENT_AMBULATORY_CARE_PROVIDER_SITE_OTHER): Payer: PPO | Admitting: Family Medicine

## 2023-03-08 ENCOUNTER — Encounter: Payer: Self-pay | Admitting: Family Medicine

## 2023-03-08 VITALS — BP 128/78 | HR 62 | Ht 67.0 in | Wt 231.0 lb

## 2023-03-08 DIAGNOSIS — K219 Gastro-esophageal reflux disease without esophagitis: Secondary | ICD-10-CM

## 2023-03-08 DIAGNOSIS — F410 Panic disorder [episodic paroxysmal anxiety] without agoraphobia: Secondary | ICD-10-CM

## 2023-03-08 DIAGNOSIS — F330 Major depressive disorder, recurrent, mild: Secondary | ICD-10-CM

## 2023-03-08 DIAGNOSIS — E039 Hypothyroidism, unspecified: Secondary | ICD-10-CM | POA: Diagnosis not present

## 2023-03-08 DIAGNOSIS — K2900 Acute gastritis without bleeding: Secondary | ICD-10-CM | POA: Diagnosis not present

## 2023-03-08 DIAGNOSIS — E7849 Other hyperlipidemia: Secondary | ICD-10-CM | POA: Diagnosis not present

## 2023-03-08 MED ORDER — SERTRALINE HCL 50 MG PO TABS: 50.0000 mg | ORAL_TABLET | Freq: Every day | ORAL | 1 refills | Status: DC

## 2023-03-08 MED ORDER — LEVOTHYROXINE SODIUM 50 MCG PO TABS: 50.0000 ug | ORAL_TABLET | Freq: Every day | ORAL | 1 refills | Status: DC

## 2023-03-08 MED ORDER — ALPRAZOLAM 0.25 MG PO TABS: 0.2500 mg | ORAL_TABLET | ORAL | 1 refills | Status: DC | PRN

## 2023-03-08 MED ORDER — OMEPRAZOLE 20 MG PO CPDR: 20.0000 mg | DELAYED_RELEASE_CAPSULE | Freq: Every day | ORAL | Status: DC

## 2023-03-08 NOTE — Patient Instructions (Signed)

## 2023-03-08 NOTE — Progress Notes (Signed)
Date:  03/08/2023   Name:  Brenda Campbell   DOB:  09/17/1951   MRN:  [id number]   Chief Complaint: Anxiety, Hypothyroidism, Gastroesophageal Reflux, and Depression  Anxiety Presents for follow-up visit. Patient reports no chest pain, confusion, depressed mood, excessive worry, feeling of choking, impotence, insomnia, irritability, nervous/anxious behavior, palpitations, panic, restlessness, shortness of breath or suicidal ideas. The severity of symptoms is mild.    Gastroesophageal Reflux She reports no abdominal pain, no chest pain, no coughing, no dysphagia, no heartburn or no wheezing. This is a chronic problem. The problem has been gradually improving. Pertinent negatives include no anemia, fatigue, melena or weight loss. She has tried a PPI for the symptoms. The treatment provided moderate relief.  Depression        This is a chronic problem.  The current episode started more than 1 year ago.   The problem has been gradually improving since onset.  Associated symptoms include no fatigue, no helplessness, no hopelessness, does not have insomnia, not irritable, no restlessness, no decreased interest, not sad and no suicidal ideas.  Past treatments include SSRIs - Selective serotonin reuptake inhibitors.  Past medical history includes thyroid problem and anxiety.   Thyroid Problem Presents for follow-up visit. Patient reports no anxiety, depressed mood, fatigue, palpitations, weight gain or weight loss.    Lab Results  Component Value Date   NA 140 09/07/2021   K 3.9 09/07/2021   CO2 24 09/07/2021   GLUCOSE 104 (H) 09/07/2021   BUN 10 09/07/2021   CREATININE 0.60 08/29/2022   CALCIUM 8.6 (L) 09/07/2021   EGFR 95 09/07/2021   GFRNONAA 90 10/05/2020   Lab Results  Component Value Date   CHOL 209 (H) 03/07/2022   HDL 68 03/07/2022   LDLCALC 126 (H) 03/07/2022   TRIG 85 03/07/2022   CHOLHDL 4.3 10/22/2018   Lab Results  Component Value Date   TSH 2.030 03/07/2022    No results found for: "HGBA1C" Lab Results  Component Value Date   WBC 5.7 02/14/2020   HGB 14.5 09/07/2021   HCT 42.4 02/14/2020   MCV 98 (H) 02/14/2020   PLT 248 02/14/2020   Lab Results  Component Value Date   ALT 13 (L) 03/15/2017   AST 17 03/15/2017   ALKPHOS 54 03/15/2017   BILITOT 0.5 03/15/2017   No results found for: "25OHVITD2", "25OHVITD3", "VD25OH"   Review of Systems  Constitutional:  Negative for fatigue, irritability, weight gain and weight loss.  HENT:  Negative for trouble swallowing.   Eyes:  Negative for photophobia.  Respiratory:  Negative for cough, chest tightness, shortness of breath and wheezing.   Cardiovascular:  Negative for chest pain, palpitations and leg swelling.  Gastrointestinal:  Negative for abdominal pain, blood in stool, dysphagia, heartburn and melena.  Endocrine: Negative for polydipsia and polyuria.  Genitourinary:  Negative for difficulty urinating, impotence and vaginal bleeding.  Psychiatric/Behavioral:  Positive for depression. Negative for confusion and suicidal ideas. The patient is not nervous/anxious and does not have insomnia.     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 ENDOSCOPY;  Service: Gastroenterology;  Laterality: N/A;   COLONOSCOPY WITH PROPOFOL N/A 04/12/2021   Procedure: COLONOSCOPY WITH PROPOFOL;  Surgeon: [name], MD;  Location: [facility] ENDOSCOPY;  Service: Gastroenterology;  Laterality: N/A;   ESOPHAGOGASTRODUODENOSCOPY (EGD) WITH PROPOFOL N/A 03/15/2017   Procedure: ESOPHAGOGASTRODUODENOSCOPY (EGD) WITH PROPOFOL;  Surgeon: [name], MD;  Location: [facility] ENDOSCOPY;  Service: Endoscopy;  Laterality: N/A;   ESOPHAGOGASTRODUODENOSCOPY (EGD) WITH PROPOFOL N/A 03/30/2017   Procedure: ESOPHAGOGASTRODUODENOSCOPY (EGD) WITH PROPOFOL;  Surgeon: [name], MD;  Location: [facility] ENDOSCOPY;  Service: Endoscopy;  Laterality: N/A;  Endoscopic Capsule Placement   ESOPHAGOGASTRODUODENOSCOPY (EGD) WITH PROPOFOL N/A 12/01/2017   Procedure: ESOPHAGOGASTRODUODENOSCOPY (EGD) WITH PROPOFOL;  Surgeon: [name], MD;  Location: [facility] ENDOSCOPY;  Service: Gastroenterology;  Laterality: N/A;   ESOPHAGOGASTRODUODENOSCOPY (EGD) WITH PROPOFOL N/A 04/12/2021   Procedure: ESOPHAGOGASTRODUODENOSCOPY (EGD) WITH PROPOFOL;  Surgeon: [name], MD;  Location: [facility] ENDOSCOPY;  Service: Gastroenterology;  Laterality: N/A;   FRACTURE SURGERY     GALLBLADDER SURGERY     GIVENS CAPSULE STUDY N/A 03/17/2017   Procedure: GIVENS CAPSULE STUDY;  Surgeon: Lucilla Lame, MD;  Location: [facility] ENDOSCOPY;  Service: Endoscopy;  Laterality: N/A;   GIVENS CAPSULE STUDY N/A 12/01/2017   Procedure: GIVENS CAPSULE STUDY, 12 HOUR;  Surgeon: [name], MD;  Location[facility] ENDOSCOPY;  Service: Gastroenterology;  Laterality: N/A;   SMALL INTESTINE SURGERY     THROAT SURGERY     nodule on vocal chord   TIBIA FRACTURE SURGERY Right     WRIST SURGERY Right    arthritis    Social History   Tobacco Use   Smoking status: Former   Smokeless tobacco: Never  Vaping Use   Vaping Use: Never used  Substance Use Topics   Alcohol use: Yes    Alcohol/week: 9.0 standard drinks of alcohol    Types: 4 Cans of beer, 5 Shots of liquor per week   Drug use: No     Medication list has been reviewed and updated.  Current Meds  Medication Sig   ALPRAZolam (XANAX) 0.25 MG tablet Take 1 tablet (0.25 mg total) by mouth as needed.   conjugated estrogens (PREMARIN) vaginal cream Estrogen Cream Instruction Discard applicator Apply pea sized amount to tip of finger to urethra before bed. Wash hands well after application. Use Monday, Wednesday and Friday   levothyroxine (SYNTHROID) 50 MCG tablet Take 1 tablet (50 mcg total) by mouth daily.   nystatin cream (MYCOSTATIN) Apply 1 Application topically 2 (two) times daily.   omeprazole (PRILOSEC) 20 MG capsule Take 1 capsule (20 mg total) by mouth 2 (two) times daily. (Patient taking differently: Take 20 mg by mouth daily.)   sertraline (ZOLOFT) 50 MG tablet Take 1 tablet (50 mg total) by mouth daily.   Vibegron (GEMTESA) 75 MG TABS Take 1 tablet (75 mg total) by mouth daily.       03/08/2023  9:24 AM 02/03/2023   10:06 AM 09/07/2022    9:34 AM 03/07/2022   10:18 AM  GAD 7 : Generalized Anxiety Score  Nervous, Anxious, on Edge 0 0 0 0  Control/stop worrying 0 0 0 0  Worry too much - different things 0 0 0 0  Trouble relaxing 0 0 0 0  Restless 0 0 0 0  Easily annoyed or irritable 0 0 0 0  Afraid - awful might happen 0 0 0 0  Total GAD 7 Score 0 0 0 0  Anxiety Difficulty Not difficult at all Not difficult at all Not difficult at all Not difficult at all       03/08/2023    9:24 AM 02/03/2023   10:06 AM 10/26/2022    3:18 PM  Depression screen PHQ 2/9  Decreased Interest 0 0 0  Down, Depressed, Hopeless 0 0 0  PHQ - 2 Score 0 0 0  Altered sleeping 0 0 0  Tired, decreased energy  0 0 0  Change in appetite 0 0 0  Feeling bad or failure about yourself  0 0 0  Trouble concentrating 0 0 0  Moving slowly or fidgety/restless 0 0 0  Suicidal thoughts 0 0 0  PHQ-9 Score 0 0 0  Difficult doing work/chores Not difficult at all Not difficult at all Not difficult at all    BP Readings from Last 3 Encounters:  03/08/23 128/78  02/03/23 120/76  01/16/23 (!) 151/83    Physical Exam Vitals and nursing note reviewed. Exam conducted with a chaperone present.  Constitutional:      General: She is not irritable.She is not in acute distress.    Appearance: She is not diaphoretic.  HENT:     Head: Normocephalic and atraumatic.     Right Ear: External ear normal.     Left Ear: External ear normal.     Nose: Nose normal.     Mouth/Throat:     Mouth: Mucous membranes are moist.  Eyes:     General:        Right eye: No discharge.        Left eye: No discharge.     Conjunctiva/sclera: Conjunctivae normal.     Pupils: Pupils are equal, round, and reactive to light.  Neck:     Thyroid: No thyromegaly.     Vascular: No JVD.  Cardiovascular:     Rate and Rhythm: Normal rate and regular rhythm.     Heart sounds: Normal heart sounds. No murmur heard.    No friction rub. No gallop.  Pulmonary:     Effort: Pulmonary effort is normal.     Breath sounds: Normal breath sounds.  Abdominal:     General: Bowel sounds are normal.     Palpations: Abdomen is soft. There is no mass.     Tenderness: There is no abdominal tenderness. There is no guarding.  Musculoskeletal:        General: Normal range of motion.     Cervical back: Normal range of motion and neck supple.  Lymphadenopathy:     Cervical: No cervical adenopathy.  Skin:    General: Skin is warm and dry.  Neurological:     Mental Status: She is alert.     Deep Tendon Reflexes: Reflexes are normal and symmetric.     Wt Readings from Last 3 Encounters:  03/08/23 231 lb (104.8 kg)  02/03/23 225 lb (102.1 kg)   01/16/23 220 lb (99.8 kg)  BP 128/78   Pulse 62   Ht 5\' 7"  (1.702 m)   Wt 231 lb (104.8 kg)   SpO2 98%   BMI 36.18 kg/m   Assessment and Plan:  1. Hypothyroidism, unspecified type Chronic.  Controlled.  Stable.  Pending TSH we will likely continue levothyroxine 50 mcg daily.  Will recheck in 6 months. - levothyroxine (SYNTHROID) 50 MCG tablet; Take 1 tablet (50 mcg total) by mouth daily.  Dispense: 90 tablet; Refill: 1 - Thyroid Panel With TSH  2. Familial multiple lipoprotein-type hyperlipidemia Chronic.  Diet controlled.  Stable.  Comprehensive metabolic panel.  Review for transaminases.  Will check lipid panel for current level of LDL surveillance   - Lipid Panel With LDL/HDL Ratio - Comprehensive Metabolic Panel (CMET)  3. Acute gastritis without hemorrhage, unspecified gastritis type Chronic.  Controlled.  Stable.  Currently taking omeprazole daily and is tolerating dose change well. - omeprazole (PRILOSEC) 20 MG capsule; Take 1 capsule (20 mg total) by mouth daily.  4. Gastroesophageal reflux disease, unspecified whether esophagitis present As noted above - omeprazole (PRILOSEC) 20 MG capsule; Take 1 capsule (20 mg total) by mouth daily.  5. 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  Controlled.  Stable.  PHQ is 0 GAD score is 0 continue sertraline 50 mg daily.  Will recheck in 6 months. - sertraline (ZOLOFT) 50 MG tablet; Take 1 tablet (50 mg total) by mouth daily.  Dispense: 90 tablet; Refill: 1  6. Panic disorder Patient has episodes of panic disorder that she needs occasional low-dose alprazolam 0.25 not to extend more than 30 per 90-day.. - ALPRAZolam (XANAX) 0.25 MG tablet; Take 1 tablet (0.25 mg total) by mouth as needed.  Dispense: 30 tablet; Refill: 1    Otilio Miu, MD

## 2023-03-09 ENCOUNTER — Other Ambulatory Visit: Payer: Self-pay

## 2023-03-09 DIAGNOSIS — E782 Mixed hyperlipidemia: Secondary | ICD-10-CM

## 2023-03-09 LAB — COMPREHENSIVE METABOLIC PANEL
ALT: 14 IU/L (ref 0–32)
AST: 17 IU/L (ref 0–40)
Albumin/Globulin Ratio: 1.4 (ref 1.2–2.2)
Albumin: 3.8 g/dL (ref 3.8–4.8)
Alkaline Phosphatase: 88 IU/L (ref 44–121)
BUN/Creatinine Ratio: 17 (ref 12–28)
BUN: 12 mg/dL (ref 8–27)
Bilirubin Total: 0.5 mg/dL (ref 0.0–1.2)
CO2: 23 mmol/L (ref 20–29)
Calcium: 8.6 mg/dL — ABNORMAL LOW (ref 8.7–10.3)
Chloride: 103 mmol/L (ref 96–106)
Creatinine, Ser: 0.7 mg/dL (ref 0.57–1.00)
Globulin, Total: 2.7 g/dL (ref 1.5–4.5)
Glucose: 113 mg/dL — ABNORMAL HIGH (ref 70–99)
Potassium: 4.1 mmol/L (ref 3.5–5.2)
Sodium: 144 mmol/L (ref 134–144)
Total Protein: 6.5 g/dL (ref 6.0–8.5)
eGFR: 92 mL/min/{1.73_m2} (ref >59)

## 2023-03-09 LAB — THYROID PANEL WITH TSH
Free Thyroxine Index: 2.5 (ref 1.2–4.9)
T3 Uptake Ratio: 29 % (ref 24–39)
T4, Total: 8.5 ug/dL (ref 4.5–12.0)
TSH: 2.7 u[IU]/mL (ref 0.450–4.500)

## 2023-03-09 LAB — LIPID PANEL WITH LDL/HDL RATIO
Cholesterol, Total: 239 mg/dL — ABNORMAL HIGH (ref 100–199)
HDL: 65 mg/dL (ref >39)
LDL Chol Calc (NIH): 148 mg/dL — ABNORMAL HIGH (ref 0–99)
LDL/HDL Ratio: 2.3 ratio (ref 0.0–3.2)
Triglycerides: 149 mg/dL (ref 0–149)
VLDL Cholesterol Cal: 26 mg/dL (ref 5–40)

## 2023-03-09 MED ORDER — ROSUVASTATIN CALCIUM 5 MG PO TABS: 5.0000 mg | ORAL_TABLET | Freq: Every day | ORAL | 1 refills | Status: DC

## 2023-03-18 ENCOUNTER — Other Ambulatory Visit: Payer: Self-pay | Admitting: Family Medicine

## 2023-03-18 DIAGNOSIS — K2900 Acute gastritis without bleeding: Secondary | ICD-10-CM

## 2023-03-18 DIAGNOSIS — K219 Gastro-esophageal reflux disease without esophagitis: Secondary | ICD-10-CM

## 2023-03-20 NOTE — Telephone Encounter (Signed)
Requested Prescriptions  Pending Prescriptions Disp Refills   omeprazole (PRILOSEC) 20 MG capsule [Pharmacy Med Name: OMEPRAZOLE DR 20 MG CAPSULE] 180 capsule 1    Sig: TAKE 1 CAPSULE BY MOUTH TWICE A DAY     Gastroenterology: Proton Pump Inhibitors Passed - 03/18/2023  5:25 PM      Passed - Valid encounter within last 12 months    Recent Outpatient Visits           1 week ago Hypothyroidism, unspecified type   [place] [facility]   1 month ago Flu-like symptoms   [place] [facility]   6 months ago Hypothyroidism, unspecified type   [facility]   1 year ago Hypothyroidism, unspecified type   [facility]   1 year ago Mild episode of recurrent major depressive disorder [facility])   [place] [facility]       Future Appointments             In 5 months Duanne Limerick, MD [facility] at [facility], [facility]

## 2023-03-26 DIAGNOSIS — I4891 Unspecified atrial fibrillation: Secondary | ICD-10-CM

## 2023-03-26 HISTORY — DX: Unspecified atrial fibrillation: I48.91

## 2023-03-31 ENCOUNTER — Other Ambulatory Visit: Payer: Self-pay | Admitting: Family Medicine

## 2023-03-31 DIAGNOSIS — E782 Mixed hyperlipidemia: Secondary | ICD-10-CM

## 2023-03-31 NOTE — Telephone Encounter (Signed)
Requested medication (s) are due for refill today:   No  Requested medication (s) are on the active medication list:   Yes  Future visit scheduled:   Yes   Last ordered: 03/09/2023 #30, 1 refill  Returned because a 90 day supply and DX Code are being requested.   Requested Prescriptions  Pending Prescriptions Disp Refills   rosuvastatin (CRESTOR) 5 MG tablet [Pharmacy Med Name: ROSUVASTATIN CALCIUM 5 MG TAB] 90 tablet 1    Sig: Take 1 tablet (5 mg total) by mouth daily.     Cardiovascular:  Antilipid - Statins 2 Failed - 03/31/2023 10:31 AM      Failed - Lipid Panel in normal range within the last 12 months    Cholesterol, Total  Date Value Ref Range Status  03/08/2023 239 (H) 100 - 199 mg/dL Final   LDL Chol Calc (NIH)  Date Value Ref Range Status  03/08/2023 148 (H) 0 - 99 mg/dL Final   HDL  Date Value Ref Range Status  03/08/2023 65 >39 mg/dL Final   Triglycerides  Date Value Ref Range Status  03/08/2023 149 0 - 149 mg/dL Final         Passed - Cr in normal range and within 360 days    Creatinine, Ser  Date Value Ref Range Status  03/08/2023 0.70 0.57 - 1.00 mg/dL Final         Passed - Patient is not pregnant      Passed - Valid encounter within last 12 months    Recent Outpatient Visits           3 weeks ago Hypothyroidism, unspecified type   [place] [facility]   1 month ago Flu-like symptoms   [place] [facility]   6 months ago Hypothyroidism, unspecified type   [facility]   1 year ago Hypothyroidism, unspecified type   [facility] at [facility]   1 year ago Mild episode of recurrent major depressive disorder [facility])   [place] [facility]        Future Appointments             In 5 months Duanne Limerick, MD [facility] at [facility], [facility]

## 2023-04-10 ENCOUNTER — Ambulatory Visit (INDEPENDENT_AMBULATORY_CARE_PROVIDER_SITE_OTHER): Payer: PPO | Admitting: Physician Assistant

## 2023-04-10 VITALS — BP 100/68 | HR 86 | Ht 67.0 in | Wt 231.2 lb

## 2023-04-10 DIAGNOSIS — R300 Dysuria: Secondary | ICD-10-CM

## 2023-04-10 DIAGNOSIS — B3731 Acute candidiasis of vulva and vagina: Secondary | ICD-10-CM

## 2023-04-10 MED ORDER — NITROFURANTOIN MONOHYD MACRO 100 MG PO CAPS
100.0000 mg | ORAL_CAPSULE | Freq: Two times a day (BID) | ORAL | 0 refills | Status: AC
Start: 2023-04-10 — End: 2023-04-15

## 2023-04-10 MED ORDER — NYSTATIN 100000 UNIT/GM EX CREA
1.0000 | TOPICAL_CREAM | Freq: Two times a day (BID) | CUTANEOUS | 0 refills | Status: DC
Start: 2023-04-10 — End: 2023-07-19

## 2023-04-10 NOTE — Progress Notes (Unsigned)
04/10/2023 2:38 PM   Brenda Campbell [birth year] [id number]  CC: Chief Complaint  Patient presents with   burning with urination   HPI: Brenda Campbell is a 72 y.o. female with PMH recurrent UTI on topical vaginal estrogen cream and cranberry supplements, OAB wet, and microscopic hematuria with benign CT urogram in September 2023 who presents today for evaluation of possible UTI.   Today she reports a 3-day history of dysuria, frequency, and urgency.  She denies fever, chills, nausea, or vomiting.  She has been taking OTC remedies and overall she feels her symptoms are well-controlled.  In-office UA today positive for trace ketones, 2+ blood, 2+ protein, nitrites, and 1+ leukocytes; urine microscopy with >30 WBCs/HPF, 11-30 RBCs/HPF, >10 epithelial cells/hpf, and many bacteria.   PMH: Past Medical History:  Diagnosis Date   Anxiety    Arthritis    Blood transfusion without reported diagnosis    Cancer (HCC)    small bowel ca   Depression    GERD (gastroesophageal reflux disease)    Hyperlipidemia    Hypertension    Thyroid disease     Surgical History: Past Surgical History:  Procedure Laterality Date   CESAREAN SECTION     x 1   CHOLECYSTECTOMY     COLONOSCOPY  10/12/2014   cleared for 3 years   COLONOSCOPY N/A 03/16/2017   Procedure: COLONOSCOPY;  Surgeon: Midge Minium, MD;  Location: [facility] ENDOSCOPY;  Service: Endoscopy;  Laterality: N/A;   COLONOSCOPY WITH PROPOFOL N/A 12/01/2017   Procedure: COLONOSCOPY WITH PROPOFOL;  Surgeon: Wyline Mood, MD;  Location[facility]COPY;  Service: Gastroenterology;  Laterality: N/A;   COLONOSCOPY WITH PROPOFOL N/A 04/12/2021   Procedure: COLONOSCOPY WITH PROPOFOL;  Surgeon: Wyline Mood, MD;  Location: [facility] ENDOSCOPY;  Service: Gastroenterology;  Laterality: N/A;   ESOPHAGOGASTRODUODENOSCOPY (EGD) WITH PROPOFOL N/A 03/15/2017   Procedure: ESOPHAGOGASTRODUODENOSCOPY (EGD) WITH PROPOFOL;  Surgeon: Wyline Mood, MD;   Location: [facility] ENDOSCOPY;  Service: Endoscopy;  Laterality: N/A;   ESOPHAGOGASTRODUODENOSCOPY (EGD) WITH PROPOFOL N/A 03/30/2017   Procedure: ESOPHAGOGASTRODUODENOSCOPY (EGD) WITH PROPOFOL;  Surgeon: Wyline Mood, MD;  Location: [facility] ENDOSCOPY;  Service: Endoscopy;  Laterality: N/A;  Endoscopic Capsule Placement   ESOPHAGOGASTRODUODENOSCOPY (EGD) WITH PROPOFOL N/A 12/01/2017   Procedure: ESOPHAGOGASTRODUODENOSCOPY (EGD) WITH PROPOFOL;  Surgeon: Wyline Mood, MD;  Location: [facility] ENDOSCOPY;  Service: Gastroenterology;  Laterality: N/A;   ESOPHAGOGASTRODUODENOSCOPY (EGD) WITH PROPOFOL N/A 04/12/2021   Procedure: ESOPHAGOGASTRODUODENOSCOPY (EGD) WITH PROPOFOL;  Surgeon: Wyline Mood, MD;  Location: [facility] ENDOSCOPY;  Service: Gastroenterology;  Laterality: N/A;   FRACTURE SURGERY     GALLBLADDER SURGERY     GIVENS CAPSULE STUDY N/A 03/17/2017   Procedure: GIVENS CAPSULE STUDY;  Surgeon: Midge Minium, MD;  Location: [facility] ENDOSCOPY;  Service: Endoscopy;  Laterality: N/A;   GIVENS CAPSULE STUDY N/A 12/01/2017   Procedure: GIVENS CAPSULE STUDY, 12 HOUR;  Surgeon: Wyline Mood, MD;  Location[facility] ENDOSCOPY;  Service: Gastroenterology;  Laterality: N/A;   SMALL INTESTINE SURGERY     THROAT SURGERY     nodule on vocal chord   TIBIA FRACTURE SURGERY Right    WRIST SURGERY Right    arthritis    Home Medications:  Allergies as of 04/10/2023   No Known Allergies      Medication List        Accurate as of April 10, 2023  2:38 PM. If you have any questions, ask your nurse or doctor.          STOP taking these medications  Gemtesa 75 MG Tabs Generic drug: Vibegron       TAKE these medications    ALPRAZolam 0.25 MG tablet Commonly known as: XANAX Take 1 tablet (0.25 mg total) by mouth as needed.   levothyroxine 50 MCG tablet Commonly known as: SYNTHROID Take 1 tablet (50 mcg total) by mouth daily.   nitrofurantoin (macrocrystal-monohydrate) 100 MG capsule Commonly known as:  MACROBID Take 1 capsule (100 mg total) by mouth 2 (two) times daily for 5 days.   nystatin cream Commonly known as: MYCOSTATIN Apply 1 Application topically 2 (two) times daily.   omeprazole 20 MG capsule Commonly known as: PRILOSEC TAKE 1 CAPSULE BY MOUTH TWICE A DAY   Premarin vaginal cream Generic drug: conjugated estrogens Estrogen Cream Instruction Discard applicator Apply pea sized amount to tip of finger to urethra before bed. Wash hands well after application. Use Monday, Wednesday and Friday   rosuvastatin 5 MG tablet Commonly known as: CRESTOR TAKE 1 TABLET (5 MG TOTAL) BY MOUTH DAILY.   sertraline 50 MG tablet Commonly known as: ZOLOFT Take 1 tablet (50 mg total) by mouth daily.        Allergies:  No Known Allergies  Family History: Family History  Problem Relation Age of Onset   Diabetes Mother    Heart disease Mother    Diabetes Brother    Breast cancer Neg Hx     Social History:   reports that she has quit smoking. She has never used smokeless tobacco. She reports current alcohol use of about 9.0 standard drinks of alcohol per week. She reports that she does not use drugs.  Physical Exam: BP (!) 142/84   Pulse 93   Ht 5\' 7"  (1.702 m)   Wt 231 lb 4 oz (104.9 kg)   BMI 36.22 kg/m   Constitutional:  Alert and oriented, no acute distress, nontoxic appearing HEENT: [place], AT Cardiovascular: No clubbing, cyanosis, or edema Respiratory: Normal respiratory effort, no increased work of breathing Skin: No rashes, bruises or suspicious lesions Neurologic: Grossly intact, no focal deficits, moving all 4 extremities Psychiatric: Normal mood and affect  Laboratory Data: Results for orders placed or performed in visit on 04/10/23  Microscopic Examination   Urine  Result Value Ref Range   WBC, UA >30 (A) 0 - 5 /hpf   RBC, Urine 11-30 (A) 0 - 2 /hpf   Epithelial Cells (non renal) >10 (A) 0 - 10 /hpf   Renal Epithel, UA 0-10 (A) None seen /hpf   Bacteria, UA  Many (A) None seen/Few  Urinalysis, Complete  Result Value Ref Range   Specific Gravity, UA 1.020 1.005 - 1.030   pH, UA 5.5 5.0 - 7.5   Color, UA Yellow Yellow   Appearance Ur Cloudy (A) Clear   Leukocytes,UA 1+ (A) Negative   Protein,UA 2+ (A) Negative/Trace   Glucose, UA Negative Negative   Ketones, UA Trace (A) Negative   RBC, UA 2+ (A) Negative   Bilirubin, UA Negative Negative   Urobilinogen, Ur 0.2 0.2 - 1.0 mg/dL   Nitrite, UA Positive (A) Negative   Microscopic Examination See below:    Assessment & Plan:   1. Burning with urination UA appears grossly infected today, though it appears contaminated.  Will start empiric Macrobid and send for culture for further evaluation.  Will defer repeat UA given recent benign CTU. - Urinalysis, Complete - CULTURE, URINE COMPREHENSIVE - nitrofurantoin, macrocrystal-monohydrate, (MACROBID) 100 MG capsule; Take 1 capsule (100 mg total) by mouth 2 (two)  times daily for 5 days.  Dispense: 10 capsule; Refill: 0  2. Candidiasis of female genitalia Refilling nystatin per patient request. - nystatin cream (MYCOSTATIN); Apply 1 Application topically 2 (two) times daily.  Dispense: 30 g; Refill: 0  Return if symptoms worsen or fail to improve.  Carman Ching, PA-C  [facility] [place] [address] [place], [place] [address] [phone]

## 2023-04-11 LAB — URINALYSIS, COMPLETE
Bilirubin, UA: NEGATIVE
Glucose, UA: NEGATIVE
Nitrite, UA: POSITIVE — AB
Specific Gravity, UA: 1.02 (ref 1.005–1.030)
Urobilinogen, Ur: 0.2 mg/dL (ref 0.2–1.0)
pH, UA: 5.5 (ref 5.0–7.5)

## 2023-04-11 LAB — MICROSCOPIC EXAMINATION
Epithelial Cells (non renal): >10 /hpf — AB (ref 0–10)
WBC, UA: >30 /hpf — AB (ref 0–5)

## 2023-04-12 ENCOUNTER — Telehealth: Payer: Self-pay | Admitting: Family Medicine

## 2023-04-12 LAB — CULTURE, URINE COMPREHENSIVE

## 2023-04-12 NOTE — Telephone Encounter (Signed)
Copied from CRM [id number]. Topic: General - Other >> Apr 12, 2023  9:10 AM Dondra Prader A wrote: Reason for CRM: Pt states that PCP gave her a  prescription for Statin medication and it was suppose to make an appt after taking 30 pills for blood work. Pt states the pharmacy gave her 90 pills and is wanting to know why she has so many pills when she was only suppose to be taking 60 pills in total. Pt is requesting to speak with Delice Bison. Please have Delice Bison call pt back.

## 2023-04-13 LAB — CULTURE, URINE COMPREHENSIVE

## 2023-04-21 ENCOUNTER — Telehealth: Payer: PPO | Admitting: Physician Assistant

## 2023-04-21 DIAGNOSIS — N760 Acute vaginitis: Secondary | ICD-10-CM

## 2023-04-21 MED ORDER — FLUCONAZOLE 150 MG PO TABS
150.0000 mg | ORAL_TABLET | ORAL | 0 refills | Status: DC | PRN
Start: 2023-04-21 — End: 2023-04-30

## 2023-04-21 MED ORDER — METRONIDAZOLE 500 MG PO TABS
500.0000 mg | ORAL_TABLET | Freq: Two times a day (BID) | ORAL | 0 refills | Status: AC
Start: 2023-04-21 — End: 2023-04-28

## 2023-04-21 NOTE — Progress Notes (Signed)
E-Visit for Vaginal Symptoms  We are sorry that you are not feeling well. Here is how we plan to help! Based on what you shared with me it looks like you: May have a vaginosis due to bacteria  Vaginosis is an inflammation of the vagina that can result in discharge, itching and pain. The cause is usually a change in the normal balance of vaginal bacteria or an infection. Vaginosis can also result from reduced estrogen levels after menopause.  The most common causes of vaginosis are:   Bacterial vaginosis which results from an overgrowth of one on several organisms that are normally present in your vagina.   Yeast infections which are caused by a naturally occurring fungus called candida.   Vaginal atrophy (atrophic vaginosis) which results from the thinning of the vagina from reduced estrogen levels after menopause.   Trichomoniasis which is caused by a parasite and is commonly transmitted by sexual intercourse.  Factors that increase your risk of developing vaginosis include: Medications, such as antibiotics and steroids Uncontrolled diabetes Use of hygiene products such as bubble bath, vaginal spray or vaginal deodorant Douching Wearing damp or tight-fitting clothing Using an intrauterine device (IUD) for birth control Hormonal changes, such as those associated with pregnancy, birth control pills or menopause Sexual activity Having a sexually transmitted infection  Your treatment plan is Metronidazole or Flagyl 500mg  twice a day for 7 days.  I have electronically sent this prescription into the pharmacy that you have chosen. I have also prescribed Fluconazole for possible yeast infection.   Be sure to take all of the medication as directed. Stop taking any medication if you develop a rash, tongue swelling or shortness of breath. Mothers who are breast feeding should consider pumping and discarding their breast milk while on these antibiotics. However, there is no consensus that infant  exposure at these doses would be harmful.  Remember that medication creams can weaken latex condoms. Marland Kitchen   HOME CARE:  Good hygiene may prevent some types of vaginosis from recurring and may relieve some symptoms:  Avoid baths, hot tubs and whirlpool spas. Rinse soap from your outer genital area after a shower, and dry the area well to prevent irritation. Don't use scented or harsh soaps, such as those with deodorant or antibacterial action. Avoid irritants. These include scented tampons and pads. Wipe from front to back after using the toilet. Doing so avoids spreading fecal bacteria to your vagina.  Other things that may help prevent vaginosis include:  Don't douche. Your vagina doesn't require cleansing other than normal bathing. Repetitive douching disrupts the normal organisms that reside in the vagina and can actually increase your risk of vaginal infection. Douching won't clear up a vaginal infection. Use a latex condom. Both female and female latex condoms may help you avoid infections spread by sexual contact. Wear cotton underwear. Also wear pantyhose with a cotton crotch. If you feel comfortable without it, skip wearing underwear to bed. Yeast thrives in [employer] Your symptoms should improve in the next day or two.  GET HELP RIGHT AWAY IF:  You have pain in your lower abdomen ( pelvic area or over your ovaries) You develop nausea or vomiting You develop a fever Your discharge changes or worsens You have persistent pain with intercourse You develop shortness of breath, a rapid pulse, or you faint.  These symptoms could be signs of problems or infections that need to be evaluated by a medical provider now.  MAKE SURE YOU   Understand  these instructions. Will watch your condition. Will get help right away if you are not doing well or get worse.  Thank you for choosing an e-visit.  Your e-visit answers were reviewed by a board certified advanced clinical  practitioner to complete your personal care plan. Depending upon the condition, your plan could have included both over the counter or prescription medications.  Please review your pharmacy choice. Make sure the pharmacy is open so you can pick up prescription now. If there is a problem, you may contact your provider through [employer] and have the prescription routed to another pharmacy.  Your safety is important to [place]. If you have drug allergies check your prescription carefully.   For the next 24 hours you can use MyChart to ask questions about today's visit, request a non-urgent call back, or ask for a work or school excuse. You will get an email in the next two days asking about your experience. I hope that your e-visit has been valuable and will speed your recovery.  I have spent 5 minutes in review of e-visit questionnaire, review and updating patient chart, medical decision making and response to patient.   Mar Daring, PA-C

## 2023-04-30 ENCOUNTER — Ambulatory Visit
Admission: RE | Admit: 2023-04-30 | Discharge: 2023-04-30 | Disposition: A | Discharge status: Home / Self Care | Payer: PPO | Source: Ambulatory Visit | Attending: Family Medicine | Admitting: Family Medicine

## 2023-04-30 VITALS — BP 146/93 | HR 77 | Temp 98.3°F | Resp 16 | Ht 66.0 in | Wt 220.0 lb

## 2023-04-30 DIAGNOSIS — N3000 Acute cystitis without hematuria: Secondary | ICD-10-CM | POA: Diagnosis not present

## 2023-04-30 LAB — URINALYSIS, W/ REFLEX TO CULTURE (INFECTION SUSPECTED)
Bilirubin Urine: NEGATIVE
Glucose, UA: 100 mg/dL — AB
Nitrite: POSITIVE — AB
Protein, ur: NEGATIVE mg/dL
Specific Gravity, Urine: 1.02 (ref 1.005–1.030)
WBC, UA: >50 WBC/hpf (ref 0–5)
pH: 5 (ref 5.0–8.0)

## 2023-04-30 MED ORDER — FLUCONAZOLE 150 MG PO TABS: 150.0000 mg | ORAL_TABLET | ORAL | 0 refills | Status: AC

## 2023-04-30 MED ORDER — NITROFURANTOIN MONOHYD MACRO 100 MG PO CAPS: 100.0000 mg | ORAL_CAPSULE | Freq: Two times a day (BID) | ORAL | 0 refills | Status: DC

## 2023-04-30 NOTE — Discharge Instructions (Signed)
For your UTI: Take Macrobid 2 times a day for the next 10 day  For yeast infection prevention: Take the first dose of Diflucan on day 3 and after you complete your antibiotics take the last dose.  If your symptoms do not improve in the next 7 days, be sure to follow-up here or at your primary care provider office.  Go to the emergency department if you are having increasing pain, worsening vaginal bleeding or fever.

## 2023-04-30 NOTE — ED Provider Notes (Signed)
[facility]    CSN: [id number] Arrival date & time: 04/30/23  1156      History   Chief Complaint Chief Complaint  Patient presents with   Urinary Frequency    Appt      HPI HPI Brenda Campbell is a 72 y.o. female.    Brenda Campbell presents for burning with urination, frequent urination. Feels like her symptoms never fully went away from 04/10/23.  She recently got over a yeast infection. Follows with [facility].  Tried AZO and pro-biotics, cranberry supplements prior to arrival.  Has history of recurrent UTIs. Patient is postmenopausal.         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   Gastroesophageal reflux disease 12/22/2014   Hypothyroidism 12/22/2014   Obesity 12/22/2014   Tachycardia 12/22/2014   Malaise 12/22/2014    Past Surgical History:  Procedure Laterality Date   CESAREAN SECTION     x 1   CHOLECYSTECTOMY     COLONOSCOPY  10/12/2014   cleared for 3 years   COLONOSCOPY N/A 03/16/2017   Procedure: COLONOSCOPY;  Surgeon: Midge Minium, MD;  Location: [facility] ENDOSCOPY;  Service: Endoscopy;  Laterality: N/A;   COLONOSCOPY WITH PROPOFOL N/A 12/01/2017   Procedure: COLONOSCOPY WITH PROPOFOL;  Surgeon: Wyline Mood, MD;  Location:  [facility] ENDOSCOPY;  Service: Gastroenterology;  Laterality: N/A;   COLONOSCOPY WITH PROPOFOL N/A 04/12/2021   Procedure: COLONOSCOPY WITH PROPOFOL;  Surgeon: Wyline Mood, MD;  Location: [facility] ENDOSCOPY;  Service: Gastroenterology;  Laterality: N/A;   ESOPHAGOGASTRODUODENOSCOPY (EGD) WITH PROPOFOL N/A 03/15/2017   Procedure: ESOPHAGOGASTRODUODENOSCOPY (EGD) WITH PROPOFOL;  Surgeon: Wyline Mood, MD;  Location: [facility] ENDOSCOPY;  Service: Endoscopy;  Laterality: N/A;   ESOPHAGOGASTRODUODENOSCOPY (EGD) WITH PROPOFOL N/A 03/30/2017   Procedure: ESOPHAGOGASTRODUODENOSCOPY (EGD) WITH PROPOFOL;  Surgeon: Wyline Mood, MD;  Location: [facility] ENDOSCOPY;  Service: Endoscopy;  Laterality: N/A;  Endoscopic Capsule Placement   ESOPHAGOGASTRODUODENOSCOPY (EGD) WITH PROPOFOL N/A 12/01/2017   Procedure: ESOPHAGOGASTRODUODENOSCOPY (EGD) WITH PROPOFOL;  Surgeon: Wyline Mood, MD;  Location: [facility] ENDOSCOPY;  Service: Gastroenterology;  Laterality: N/A;   ESOPHAGOGASTRODUODENOSCOPY (EGD) WITH PROPOFOL N/A 04/12/2021   Procedure: ESOPHAGOGASTRODUODENOSCOPY (EGD) WITH PROPOFOL;  Surgeon: Wyline Mood, MD;  Location: [facility] ENDOSCOPY;  Service: Gastroenterology;  Laterality: N/A;   FRACTURE SURGERY     GALLBLADDER SURGERY     GIVENS CAPSULE STUDY N/A 03/17/2017   Procedure: GIVENS CAPSULE STUDY;  Surgeon: Midge Minium, MD;  Location: [facility] ENDOSCOPY;  Service: Endoscopy;  Laterality: N/A;   GIVENS CAPSULE STUDY N/A 12/01/2017   Procedure: GIVENS CAPSULE STUDY, 12 HOUR;  Surgeon: Wyline Mood, MD;  Location: [facility] ENDOSCOPY;  Service: Gastroenterology;  Laterality: N/A;   SMALL INTESTINE SURGERY     THROAT SURGERY     nodule  on vocal chord   TIBIA FRACTURE SURGERY Right    WRIST SURGERY Right    arthritis    OB History   No obstetric history on file.      Home Medications    Prior to Admission medications   Medication Sig Start Date End Date Taking? Authorizing Provider  ALPRAZolam (XANAX) 0.25 MG tablet Take 1 tablet (0.25  mg total) by mouth as needed. 03/08/23  Yes Duanne Limerick, MD  conjugated estrogens (PREMARIN) vaginal cream Estrogen Cream Instruction Discard applicator Apply pea sized amount to tip of finger to urethra before bed. Wash hands well after application. Use Monday, Wednesday and Friday 08/13/21  Yes Vanna Scotland, MD  levothyroxine (SYNTHROID) 50 MCG tablet Take 1 tablet (50 mcg total) by mouth daily. 03/08/23  Yes Duanne Limerick, MD  nystatin cream (MYCOSTATIN) Apply 1 Application topically 2 (two) times daily. 04/10/23  Yes Vaillancourt, Samantha, PA-C  omeprazole (PRILOSEC) 20 MG capsule TAKE 1 CAPSULE BY MOUTH TWICE A DAY 03/20/23  Yes Elizabeth Sauer C, MD  rosuvastatin (CRESTOR) 5 MG tablet TAKE 1 TABLET (5 MG TOTAL) BY MOUTH DAILY. 04/03/23  Yes Duanne Limerick, MD  sertraline (ZOLOFT) 50 MG tablet Take 1 tablet (50 mg total) by mouth daily. 03/08/23  Yes Duanne Limerick, MD  fluconazole (DIFLUCAN) 150 MG tablet Take 1 tablet (150 mg total) by mouth every 3 (three) days for 2 doses. 04/30/23 05/04/23 Yes Torrian Canion, [place]ard Meth, DO  nitrofurantoin, macrocrystal-monohydrate, (MACROBID) 100 MG capsule Take 1 capsule (100 mg total) by mouth 2 (two) times daily. 04/30/23  Yes Katha Cabal, DO    Family History Family History  Problem Relation Age of Onset   Diabetes Mother    Heart disease Mother    Diabetes Brother    Breast cancer Neg Hx     Social History Social History   Tobacco Use   Smoking status: Former   Smokeless tobacco: Never  Building services engineer Use: Never used  Substance Use Topics   Alcohol use: Yes    Alcohol/week: 9.0 standard drinks of alcohol    Types: 4 Cans of beer, 5 Shots of liquor per week   Drug use: No     Allergies   Patient has no known allergies.   Review of Systems Review of Systems: :negative unless otherwise stated in HPI.      Physical Exam Triage Vital Signs ED Triage Vitals  Enc Vitals Group     BP 04/30/23 1158 (!) 146/93     Pulse Rate  04/30/23 1158 77     Resp 04/30/23 1158 16     Temp 04/30/23 1158 98.3 F (36.8 C)     Temp Source 04/30/23 1158 Oral     SpO2 04/30/23 1158 99 %     Weight 04/30/23 1158 220 lb (99.8 kg)     Height 04/30/23 1158 5\' 6"  (1.676 m)     Head Circumference --      Peak Flow --      Pain Score 04/30/23 1204 0     Pain Loc --      Pain Edu? --      Excl. in GC? --    No data found.  Updated Vital Signs BP (!) 146/93 (BP Location: Left Arm)   Pulse 77   Temp 98.3 F (36.8 C) (Oral)   Resp 16   Ht 5\' 6"  (1.676 m)   Wt 99.8 kg   SpO2 99%  BMI 35.51 kg/m   Visual Acuity Right Eye Distance:   Left Eye Distance:   Bilateral Distance:    Right Eye Near:   Left Eye Near:    Bilateral Near:     Physical Exam GEN: well appearing female in no acute distress  CVS: well perfused  RESP: speaking in full sentences without pause      UC Treatments / Results  Labs (all labs ordered are listed, but only abnormal results are displayed) Labs Reviewed  URINALYSIS, W/ REFLEX TO CULTURE (INFECTION SUSPECTED) - Abnormal; Notable for the following components:      Result Value   APPearance HAZY (*)    Glucose, UA 100 (*)    Hgb urine dipstick TRACE (*)    Ketones, ur TRACE (*)    Nitrite POSITIVE (*)    Leukocytes,Ua MODERATE (*)    Bacteria, UA MANY (*)    All other components within normal limits    EKG   Radiology No results found.  Procedures Procedures (including critical care time)  Medications Ordered in UC Medications - No data to display  Initial Impression / Assessment and Plan / UC Course  I have reviewed the triage vital signs and the nursing notes.  Pertinent labs & imaging results that were available during my care of the patient were reviewed by me and considered in my medical decision making (see chart for details).     Acute cystitis:  Patient is a 72 y.o. female  who presents for ongoing dysuria.  Overall patient is well-appearing and afebrile.   Vital signs stable.  UA consistent with acute cystitis.  Hematuria not supported on microscopy.  Last UTI was on 04/10/23. Urine culture results reviewed. Treat with Macrobid 2 times daily for 10 days.  Urine culture obtained.  Follow-up sensitivities and change antibiotics, if needed. Diflucan for prevention of antibiotic associated yeast infections  - Return precautions including abdominal pain, fever, chills, nausea, or vomiting given. Follow-up,  if symptoms not improving or getting worse. Discussed MDM, treatment plan and plan for follow-up with patient who agrees with plan.        Final Clinical Impressions(s) / UC Diagnoses   Final diagnoses:  Acute cystitis without hematuria     Discharge Instructions      For your UTI: Take Macrobid 2 times a day for the next 10 day  For yeast infection prevention: Take the first dose of Diflucan on day 3 and after you complete your antibiotics take the last dose.  If your symptoms do not improve in the next 7 days, be sure to follow-up here or at your primary care provider office.  Go to the emergency department if you are having increasing pain, worsening vaginal bleeding or fever.      ED Prescriptions     Medication Sig Dispense Auth. Provider   nitrofurantoin, macrocrystal-monohydrate, (MACROBID) 100 MG capsule Take 1 capsule (100 mg total) by mouth 2 (two) times daily. 20 capsule Ivyonna Hoelzel, DO   fluconazole (DIFLUCAN) 150 MG tablet Take 1 tablet (150 mg total) by mouth every 3 (three) days for 2 doses. 2 tablet Katha Cabal, DO      PDMP not reviewed this encounter.   Katha Cabal, DO 04/30/23 1235

## 2023-04-30 NOTE — ED Triage Notes (Signed)
Pt c/o urinary freq,burning & pain x3 wks. Denies any hematuria or discharge. Hx of recurrent UTI's. Has tried AZO w/some relief.

## 2023-06-12 ENCOUNTER — Other Ambulatory Visit: Payer: Self-pay | Admitting: Family Medicine

## 2023-06-12 DIAGNOSIS — Z1231 Encounter for screening mammogram for malignant neoplasm of breast: Secondary | ICD-10-CM

## 2023-06-28 ENCOUNTER — Ambulatory Visit
Admission: RE | Admit: 2023-06-28 | Discharge: 2023-06-28 | Disposition: A | Discharge status: Home / Self Care | Payer: PPO | Source: Ambulatory Visit | Attending: Family Medicine | Admitting: Family Medicine

## 2023-06-28 DIAGNOSIS — Z1231 Encounter for screening mammogram for malignant neoplasm of breast: Secondary | ICD-10-CM | POA: Insufficient documentation

## 2023-07-02 ENCOUNTER — Other Ambulatory Visit: Payer: Self-pay | Admitting: Family Medicine

## 2023-07-02 DIAGNOSIS — E782 Mixed hyperlipidemia: Secondary | ICD-10-CM

## 2023-07-03 NOTE — Telephone Encounter (Signed)
Requested Prescriptions  Pending Prescriptions Disp Refills   rosuvastatin (CRESTOR) 5 MG tablet [Pharmacy Med Name: ROSUVASTATIN CALCIUM 5 MG TAB] 90 tablet 0    Sig: TAKE 1 TABLET (5 MG TOTAL) BY MOUTH DAILY.     Cardiovascular:  Antilipid - Statins 2 Failed - 07/02/2023  1:54 AM      Failed - Lipid Panel in normal range within the last 12 months    Cholesterol, Total  Date Value Ref Range Status  03/08/2023 239 (H) 100 - 199 mg/dL Final   LDL Chol Calc (NIH)  Date Value Ref Range Status  03/08/2023 148 (H) 0 - 99 mg/dL Final   HDL  Date Value Ref Range Status  03/08/2023 65 >39 mg/dL Final   Triglycerides  Date Value Ref Range Status  03/08/2023 149 0 - 149 mg/dL Final         Passed - Cr in normal range and within 360 days    Creatinine, Ser  Date Value Ref Range Status  03/08/2023 0.70 0.57 - 1.00 mg/dL Final         Passed - Patient is not pregnant      Passed - Valid encounter within last 12 months    Recent Outpatient Visits           3 months ago Hypothyroidism, unspecified type   [place] [facility]   5 months ago Flu-like symptoms   [place] [facility]   9 months ago Hypothyroidism, unspecified type   [facility]   1 year ago Hypothyroidism, unspecified type   [facility] at [facility]   1 year ago Mild episode of recurrent major depressive disorder [facility])   [place] [facility], [facility]       Future Appointments             In 2 months Duanne Limerick, MD [facility] at [facility], [facility]

## 2023-07-17 ENCOUNTER — Telehealth: Payer: PPO | Admitting: Nurse Practitioner

## 2023-07-17 DIAGNOSIS — R300 Dysuria: Secondary | ICD-10-CM

## 2023-07-17 NOTE — Progress Notes (Signed)
[place],   Thank you for submitting an e-visit request. We do have a protocol for patients with a more complicated history to be either seen in person or treated by their primary care provider.   It is best for anyone with recurrent issues to have urine cultures performed to assess for antibiotic resistance.  I feel your condition warrants further evaluation and I recommend that you be seen for a face to face visit.  Please contact your primary care physician practice to be seen. Many offices offer virtual options to be seen via video if you are not comfortable going in person to a medical facility at this time.  NOTE: You will NOT be charged for this eVisit.  If you do not have a PCP, [facility] offers a free physician referral service available at [phone]. Our trained staff has the experience, knowledge and resources to put you in touch with a physician who is right for you.    If you are having a true medical emergency please call 911.   Your e-visit answers were reviewed by a board certified advanced clinical practitioner to complete your personal care plan.  Thank you for using e-Visits.

## 2023-07-18 ENCOUNTER — Telehealth: Payer: Self-pay

## 2023-07-18 NOTE — Telephone Encounter (Addendum)
Pt called triage line stating she feels like she has a UTI. Pt states she has burning with urination and urinary frequency. Appt was made for tomorrow with a PA. Pt was advise to be prepare to give a urine sample upon arrival. Pt voiced understanding.

## 2023-07-19 ENCOUNTER — Ambulatory Visit: Payer: PPO | Admitting: Physician Assistant

## 2023-07-19 VITALS — Ht 66.0 in | Wt 220.0 lb

## 2023-07-19 DIAGNOSIS — R300 Dysuria: Secondary | ICD-10-CM

## 2023-07-19 DIAGNOSIS — R3915 Urgency of urination: Secondary | ICD-10-CM

## 2023-07-19 DIAGNOSIS — B3731 Acute candidiasis of vulva and vagina: Secondary | ICD-10-CM

## 2023-07-19 DIAGNOSIS — R350 Frequency of micturition: Secondary | ICD-10-CM | POA: Diagnosis not present

## 2023-07-19 DIAGNOSIS — Z87440 Personal history of urinary (tract) infections: Secondary | ICD-10-CM | POA: Diagnosis not present

## 2023-07-19 DIAGNOSIS — N390 Urinary tract infection, site not specified: Secondary | ICD-10-CM

## 2023-07-19 LAB — MICROSCOPIC EXAMINATION: WBC, UA: >30 /hpf — AB (ref 0–5)

## 2023-07-19 LAB — URINALYSIS, COMPLETE
Bilirubin, UA: NEGATIVE
Glucose, UA: NEGATIVE
Ketones, UA: NEGATIVE
Nitrite, UA: POSITIVE — AB
Specific Gravity, UA: >1.03 — ABNORMAL HIGH (ref 1.005–1.030)
Urobilinogen, Ur: 0.2 mg/dL (ref 0.2–1.0)
pH, UA: 5.5 (ref 5.0–7.5)

## 2023-07-19 MED ORDER — SULFAMETHOXAZOLE-TRIMETHOPRIM 800-160 MG PO TABS
1.0000 | ORAL_TABLET | Freq: Two times a day (BID) | ORAL | 0 refills | Status: DC
Start: 2023-07-19 — End: 2023-10-16

## 2023-07-19 MED ORDER — METHENAMINE HIPPURATE 1 G PO TABS
1.0000 g | ORAL_TABLET | Freq: Two times a day (BID) | ORAL | 11 refills | Status: DC
Start: 2023-07-19 — End: 2024-07-22

## 2023-07-19 MED ORDER — NYSTATIN 100000 UNIT/GM EX CREA
1.0000 | TOPICAL_CREAM | Freq: Two times a day (BID) | CUTANEOUS | 0 refills | Status: DC
Start: 2023-07-19 — End: 2024-01-29

## 2023-07-19 NOTE — Progress Notes (Signed)
07/19/2023 2:59 PM   [facility] August 21, 1951 [id number]  CC: Chief Complaint  Patient presents with   Follow-up   HPI: Brenda Campbell Felt is a 72 y.o. female with PMH recurrent UTI on topical vaginal estrogen cream 3 times weekly, cranberry supplements, and probiotics, GSM, OAB wet, and microscopic hematuria with benign CT urogram in September 2023 who presents today for evaluation of possible UTI.   Today she reports a 2-day history of dysuria, urgency, and frequency.  She denies fever, chills, nausea, or vomiting.  I saw her in clinic most recently for the same on 04/10/2023.  She was then seen by urgent care on 04/30/2019 for for another UTI.  No urine culture was sent but she was treated with Macrobid.  She is not ready to start a daily suppressive antibiotic.  She wonders if there is anything else that she can do to optimize her regimen.  She does note significant vulvovaginal dryness and irritation and she will awaken herself at night with scratching if she does not use moisturizers.  She is augmenting her vaginal estrogen cream with OTC vulvar moisturizers.  She is not douching.  In-office UA today positive for 2+ blood, 2+ protein, nitrites, and 1+ leukocytes; urine microscopy with >30 WBCs/HPF, 11-30 RBCs/HPF, and many bacteria.  PMH: Past Medical History:  Diagnosis Date   Anxiety    Arthritis    Blood transfusion without reported diagnosis    Cancer (HCC)    small bowel ca   Depression    GERD (gastroesophageal reflux disease)    Hyperlipidemia    Hypertension    Thyroid disease     Surgical History: Past Surgical History:  Procedure Laterality Date   CESAREAN SECTION     x 1   CHOLECYSTECTOMY     COLONOSCOPY  10/12/2014   cleared for 3 years   COLONOSCOPY N/A 03/16/2017   Procedure: COLONOSCOPY;  Surgeon: Midge Minium, MD;  Location: [facility] ENDOSCOPY;  Service: Endoscopy;  Laterality: N/A;   COLONOSCOPY WITH PROPOFOL N/A 12/01/2017   Procedure:  COLONOSCOPY WITH PROPOFOL;  Surgeon: Wyline Mood, MD;  Location: [facility] ENDOSCOPY;  Service: Gastroenterology;  Laterality: N/A;   COLONOSCOPY WITH PROPOFOL N/A 04/12/2021   Procedure: COLONOSCOPY WITH PROPOFOL;  Surgeon: Wyline Mood, MD;  Location: [facility] ENDOSCOPY;  Service: Gastroenterology;  Laterality: N/A;   ESOPHAGOGASTRODUODENOSCOPY (EGD) WITH PROPOFOL N/A 03/15/2017   Procedure: ESOPHAGOGASTRODUODENOSCOPY (EGD) WITH PROPOFOL;  Surgeon: Wyline Mood, MD;  Location: [facility] ENDOSCOPY;  Service: Endoscopy;  Laterality: N/A;   ESOPHAGOGASTRODUODENOSCOPY (EGD) WITH PROPOFOL N/A 03/30/2017   Procedure: ESOPHAGOGASTRODUODENOSCOPY (EGD) WITH PROPOFOL;  Surgeon: Wyline Mood, MD;  Location: [facility] ENDOSCOPY;  Service: Endoscopy;  Laterality: N/A;  Endoscopic Capsule Placement   ESOPHAGOGASTRODUODENOSCOPY (EGD) WITH PROPOFOL N/A 12/01/2017   Procedure: ESOPHAGOGASTRODUODENOSCOPY (EGD) WITH PROPOFOL;  Surgeon: Wyline Mood, MD;  Location: [facility] ENDOSCOPY;  Service: Gastroenterology;  Laterality: N/A;   ESOPHAGOGASTRODUODENOSCOPY (EGD) WITH PROPOFOL N/A 04/12/2021   Procedure: ESOPHAGOGASTRODUODENOSCOPY (EGD) WITH PROPOFOL;  Surgeon: Wyline Mood, MD;  Location: [facility] ENDOSCOPY;  Service: Gastroenterology;  Laterality: N/A;   FRACTURE SURGERY     GALLBLADDER SURGERY     GIVENS CAPSULE STUDY N/A 03/17/2017   Procedure: GIVENS CAPSULE STUDY;  Surgeon: Midge Minium, MD;  Location: [facility] ENDOSCOPY;  Service: Endoscopy;  Laterality: N/A;   GIVENS CAPSULE STUDY N/A 12/01/2017   Procedure: GIVENS CAPSULE STUDY, 12 HOUR;  Surgeon: Wyline Mood, MD;  Location: [facility] ENDOSCOPY;  Service: Gastroenterology;  Laterality: N/A;   SMALL INTESTINE SURGERY  THROAT SURGERY     nodule on vocal chord   TIBIA FRACTURE SURGERY Right    WRIST SURGERY Right    arthritis    Home Medications:  Allergies as of 07/19/2023   No Known Allergies      Medication List        Accurate as of July 19, 2023  2:59 PM. If you have any  questions, ask your nurse or doctor.          ALPRAZolam 0.25 MG tablet Commonly known as: XANAX Take 1 tablet (0.25 mg total) by mouth as needed.   levothyroxine 50 MCG tablet Commonly known as: SYNTHROID Take 1 tablet (50 mcg total) by mouth daily.   methenamine 1 g tablet Commonly known as: Hiprex Take 1 tablet (1 g total) by mouth 2 (two) times daily with a meal.   nitrofurantoin (macrocrystal-monohydrate) 100 MG capsule Commonly known as: MACROBID Take 1 capsule (100 mg total) by mouth 2 (two) times daily.   nystatin cream Commonly known as: MYCOSTATIN Apply 1 Application topically 2 (two) times daily.   omeprazole 20 MG capsule Commonly known as: PRILOSEC TAKE 1 CAPSULE BY MOUTH TWICE A DAY   Premarin vaginal cream Generic drug: conjugated estrogens Estrogen Cream Instruction Discard applicator Apply pea sized amount to tip of finger to urethra before bed. Wash hands well after application. Use Monday, Wednesday and Friday   rosuvastatin 5 MG tablet Commonly known as: CRESTOR TAKE 1 TABLET (5 MG TOTAL) BY MOUTH DAILY.   sertraline 50 MG tablet Commonly known as: ZOLOFT Take 1 tablet (50 mg total) by mouth daily.   sulfamethoxazole-trimethoprim 800-160 MG tablet Commonly known as: BACTRIM DS Take 1 tablet by mouth 2 (two) times daily for 5 days.        Allergies:  No Known Allergies  Family History: Family History  Problem Relation Age of Onset   Diabetes Mother    Heart disease Mother    Diabetes Brother    Breast cancer Neg Hx     Social History:   reports that she has quit smoking. She has never used smokeless tobacco. She reports current alcohol use of about 9.0 standard drinks of alcohol per week. She reports that she does not use drugs.  Physical Exam: Ht 5\' 6"  (1.676 m)   Wt 220 lb (99.8 kg)   BMI 35.51 kg/m   Constitutional:  Alert and oriented, no acute distress, nontoxic appearing HEENT: [place], AT Cardiovascular: No clubbing,  cyanosis, or edema Respiratory: Normal respiratory effort, no increased work of breathing Skin: No rashes, bruises or suspicious lesions Neurologic: Grossly intact, no focal deficits, moving all 4 extremities Psychiatric: Normal mood and affect  Laboratory Data: Results for orders placed or performed in visit on 07/19/23  Microscopic Examination   Urine  Result Value Ref Range   WBC, UA >30 (A) 0 - 5 /hpf   RBC, Urine 11-30 (A) 0 - 2 /hpf   Epithelial Cells (non renal) 0-10 0 - 10 /hpf   Bacteria, UA Many (A) None seen/Few  Urinalysis, Complete  Result Value Ref Range   Specific Gravity, UA >1.030 (H) 1.005 - 1.030   pH, UA 5.5 5.0 - 7.5   Color, UA Yellow Yellow   Appearance Ur Cloudy (A) Clear   Leukocytes,UA 1+ (A) Negative   Protein,UA 2+ (A) Negative/Trace   Glucose, UA Negative Negative   Ketones, UA Negative Negative   RBC, UA 2+ (A) Negative   Bilirubin, UA Negative Negative  Urobilinogen, Ur 0.2 0.2 - 1.0 mg/dL   Nitrite, UA Positive (A) Negative   Microscopic Examination See below:    Assessment & Plan:   1. Recurrent UTI UA appears grossly infected today, will start empiric Bactrim and send for culture for further evaluation.  I encouraged her to continue topical vaginal estrogen cream 3 times daily, cranberry supplements, probiotics, and vulvovaginal moisturizers for symptom relief.  Okay to defer daily suppressive antibiotics, but I did offer her methenamine and she agreed with this. - Urinalysis, Complete - CULTURE, URINE COMPREHENSIVE - sulfamethoxazole-trimethoprim (BACTRIM DS) 800-160 MG tablet; Take 1 tablet by mouth 2 (two) times daily for 5 days.  Dispense: 10 tablet; Refill: 0 - methenamine (HIPREX) 1 g tablet; Take 1 tablet (1 g total) by mouth 2 (two) times daily with a meal.  Dispense: 60 tablet; Refill: 11  2. 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 - nystatin cream (MYCOSTATIN); Apply 1  Application topically 2 (two) times daily.  Dispense: 30 g; Refill: 0  Return if symptoms worsen or fail to improve.  Carman Ching, PA-C  [facility] [place] [address] [place], [place] [address] [phone]

## 2023-07-28 ENCOUNTER — Ambulatory Visit: Payer: PPO | Admitting: Family Medicine

## 2023-08-02 ENCOUNTER — Ambulatory Visit: Payer: PPO | Admitting: Physician Assistant

## 2023-08-03 NOTE — Telephone Encounter (Signed)
Pt scheduled for 08/04/2023, pt verbalized understanding.

## 2023-08-04 ENCOUNTER — Ambulatory Visit: Payer: PPO | Admitting: Physician Assistant

## 2023-08-04 ENCOUNTER — Encounter: Payer: Self-pay | Admitting: Physician Assistant

## 2023-08-04 VITALS — BP 117/70 | HR 94 | Wt 220.0 lb

## 2023-08-04 DIAGNOSIS — N390 Urinary tract infection, site not specified: Secondary | ICD-10-CM | POA: Diagnosis not present

## 2023-08-04 DIAGNOSIS — N958 Other specified menopausal and perimenopausal disorders: Secondary | ICD-10-CM | POA: Diagnosis not present

## 2023-08-04 DIAGNOSIS — R399 Unspecified symptoms and signs involving the genitourinary system: Secondary | ICD-10-CM | POA: Diagnosis not present

## 2023-08-04 DIAGNOSIS — Z87440 Personal history of urinary (tract) infections: Secondary | ICD-10-CM

## 2023-08-04 DIAGNOSIS — R300 Dysuria: Secondary | ICD-10-CM

## 2023-08-04 LAB — URINALYSIS, COMPLETE
Bilirubin, UA: NEGATIVE
Glucose, UA: NEGATIVE
Ketones, UA: NEGATIVE
Leukocytes,UA: NEGATIVE
Nitrite, UA: NEGATIVE
Protein,UA: NEGATIVE
Specific Gravity, UA: 1.025 (ref 1.005–1.030)
Urobilinogen, Ur: 0.2 mg/dL (ref 0.2–1.0)
pH, UA: 5.5 (ref 5.0–7.5)

## 2023-08-04 LAB — MICROSCOPIC EXAMINATION
Bacteria, UA: NONE SEEN
Epithelial Cells (non renal): NONE SEEN /hpf (ref 0–10)

## 2023-08-04 NOTE — Progress Notes (Unsigned)
08/04/2023 3:24 PM   [facility] [place] 06/18/1951 [id number]  CC: Chief Complaint  Patient presents with   Follow-up   Gynecologic Exam   HPI: Brenda Campbell is a 72 y.o. female with PMH recurrent UTI on topical vaginal estrogen cream 3 times weekly, cranberry supplements, and probiotics; GSM; OAB wet; and microscopic hematuria with benign CT urogram in September 2023 who presents today for evaluation of possible recurrent versus persistent UTI.   I saw her in clinic most recently on 07/19/2023 for evaluation of possible UTI.  UA appeared grossly infected and I treated her with culture appropriate Bactrim for E. coli UTI.  I transitioned her to methenamine thereafter for UTI prevention.  Today she reports she had severe dysuria after completing the antibiotic and was sure she had a recurrent versus persistent UTI.  She reduced methenamine to once daily and over time her symptoms have resolved.  She is not sure if her symptom resolution has to do with reducing her methenamine dose.  Today she is asymptomatic.  In-office catheterized UA today positive for trace lysed blood; urine microscopy pan negative.  PMH: Past Medical History:  Diagnosis Date   Anxiety    Arthritis    Blood transfusion without reported diagnosis    Cancer (HCC)    small bowel ca   Depression    GERD (gastroesophageal reflux disease)    Hyperlipidemia    Hypertension    Thyroid disease     Surgical History: Past Surgical History:  Procedure Laterality Date   CESAREAN SECTION     x 1   CHOLECYSTECTOMY     COLONOSCOPY  10/12/2014   cleared for 3 years   COLONOSCOPY N/A 03/16/2017   Procedure: COLONOSCOPY;  Surgeon: Midge Minium, MD;  Location: [facility] ENDOSCOPY;  Service: Endoscopy;  Laterality: N/A;   COLONOSCOPY WITH PROPOFOL N/A 12/01/2017   Procedure: COLONOSCOPY WITH PROPOFOL;  Surgeon: Wyline Mood, MD;  Location: [facility] ENDOSCOPY;  Service: Gastroenterology;  Laterality: N/A;    COLONOSCOPY WITH PROPOFOL N/A 04/12/2021   Procedure: COLONOSCOPY WITH PROPOFOL;  Surgeon: Wyline Mood, MD;  Location: [facility] ENDOSCOPY;  Service: Gastroenterology;  Laterality: N/A;   ESOPHAGOGASTRODUODENOSCOPY (EGD) WITH PROPOFOL N/A 03/15/2017   Procedure: ESOPHAGOGASTRODUODENOSCOPY (EGD) WITH PROPOFOL;  Surgeon: Wyline Mood, MD;  Location: [facility] ENDOSCOPY;  Service: Endoscopy;  Laterality: N/A;   ESOPHAGOGASTRODUODENOSCOPY (EGD) WITH PROPOFOL N/A 03/30/2017   Procedure: ESOPHAGOGASTRODUODENOSCOPY (EGD) WITH PROPOFOL;  Surgeon: Wyline Mood, MD;  Location: [facility] ENDOSCOPY;  Service: Endoscopy;  Laterality: N/A;  Endoscopic Capsule Placement   ESOPHAGOGASTRODUODENOSCOPY (EGD) WITH PROPOFOL N/A 12/01/2017   Procedure: ESOPHAGOGASTRODUODENOSCOPY (EGD) WITH PROPOFOL;  Surgeon: Wyline Mood, MD;  Location: [facility] ENDOSCOPY;  Service: Gastroenterology;  Laterality: N/A;   ESOPHAGOGASTRODUODENOSCOPY (EGD) WITH PROPOFOL N/A 04/12/2021   Procedure: ESOPHAGOGASTRODUODENOSCOPY (EGD) WITH PROPOFOL;  Surgeon: Wyline Mood, MD;  Location: [facility] ENDOSCOPY;  Service: Gastroenterology;  Laterality: N/A;   FRACTURE SURGERY     GALLBLADDER SURGERY     GIVENS CAPSULE STUDY N/A 03/17/2017   Procedure: GIVENS CAPSULE STUDY;  Surgeon: Midge Minium, MD;  Location: [facility] ENDOSCOPY;  Service: Endoscopy;  Laterality: N/A;   GIVENS CAPSULE STUDY N/A 12/01/2017   Procedure: GIVENS CAPSULE STUDY, 12 HOUR;  Surgeon: Wyline Mood, MD;  Location: [facility] ENDOSCOPY;  Service: Gastroenterology;  Laterality: N/A;   SMALL INTESTINE SURGERY     THROAT SURGERY     nodule on vocal chord   TIBIA FRACTURE SURGERY Right    WRIST SURGERY Right    arthritis  Home Medications:  Allergies as of 08/04/2023   No Known Allergies      Medication List        Accurate as of August 04, 2023  3:24 PM. If you have any questions, ask your nurse or doctor.          STOP taking these medications    nitrofurantoin (macrocrystal-monohydrate) 100 MG  capsule Commonly known as: MACROBID Stopped by: Carman Ching       TAKE these medications    ALPRAZolam 0.25 MG tablet Commonly known as: XANAX Take 1 tablet (0.25 mg total) by mouth as needed.   levothyroxine 50 MCG tablet Commonly known as: SYNTHROID Take 1 tablet (50 mcg total) by mouth daily.   methenamine 1 g tablet Commonly known as: Hiprex Take 1 tablet (1 g total) by mouth 2 (two) times daily with a meal.   nystatin cream Commonly known as: MYCOSTATIN Apply 1 Application topically 2 (two) times daily.   omeprazole 20 MG capsule Commonly known as: PRILOSEC TAKE 1 CAPSULE BY MOUTH TWICE A DAY   Premarin vaginal cream Generic drug: conjugated estrogens Estrogen Cream Instruction Discard applicator Apply pea sized amount to tip of finger to urethra before bed. Wash hands well after application. Use Monday, Wednesday and Friday   rosuvastatin 5 MG tablet Commonly known as: CRESTOR TAKE 1 TABLET (5 MG TOTAL) BY MOUTH DAILY.   sertraline 50 MG tablet Commonly known as: ZOLOFT Take 1 tablet (50 mg total) by mouth daily.        Allergies:  No Known Allergies  Family History: Family History  Problem Relation Age of Onset   Diabetes Mother    Heart disease Mother    Diabetes Brother    Breast cancer Neg Hx     Social History:   reports that she has quit smoking. She has never used smokeless tobacco. She reports current alcohol use of about 9.0 standard drinks of alcohol per week. She reports that she does not use drugs.  Physical Exam: BP 117/70   Pulse 94   Wt 220 lb (99.8 kg)   BMI 35.51 kg/m   Constitutional:  Alert and oriented, no acute distress, nontoxic appearing HEENT: [place], AT Cardiovascular: No clubbing, cyanosis, or edema Respiratory: Normal respiratory effort, no increased work of breathing GU: Clitoral phimosis.  Dry labia minora.  Irregular patchy regions of hypopigmentation along the bilateral labia minora Skin: No rashes,  bruises or suspicious lesions Neurologic: Grossly intact, no focal deficits, moving all 4 extremities Psychiatric: Normal mood and affect  Laboratory Data: Results for orders placed or performed in visit on 08/04/23  Microscopic Examination   Urine  Result Value Ref Range   WBC, UA 0-5 0 - 5 /hpf   RBC, Urine 0-2 0 - 2 /hpf   Epithelial Cells (non renal) None seen 0 - 10 /hpf   Bacteria, UA None seen None seen/Few  Urinalysis, Complete  Result Value Ref Range   Specific Gravity, UA 1.025 1.005 - 1.030   pH, UA 5.5 5.0 - 7.5   Color, UA Yellow Yellow   Appearance Ur Clear Clear   Leukocytes,UA Negative Negative   Protein,UA Negative Negative/Trace   Glucose, UA Negative Negative   Ketones, UA Negative Negative   RBC, UA Trace (A) Negative   Bilirubin, UA Negative Negative   Urobilinogen, Ur 0.2 0.2 - 1.0 mg/dL   Nitrite, UA Negative Negative   Microscopic Examination See below:    Assessment & Plan:   1.  Recurrent UTI Symptoms have resolved and UA today is bland.  Continue methenamine, once daily is fine, may increase in the future. - Urinalysis, Complete  2. Genitourinary syndrome of menopause Physical exam today consistent with GSM, however she has some irregular patchy areas of hypopigmentation along the bilateral labia minora that make me question lichen sclerosus.  I would like her to get a second opinion on this from gynecology and am placing a referral today.  We discussed that vulvar LS can mimic GSM symptoms but is managed differently. - Ambulatory referral to Obstetrics / Gynecology  Return if symptoms worsen or fail to improve.  Carman Ching, PA-C  [facility] [place] [address] [place], [place] [address] [phone]

## 2023-08-04 NOTE — Progress Notes (Unsigned)
In and Out Catheterization  Patient is present today for a I & O catheterization due to urinary tract infection symptoms. Patient was cleaned and prepped in a sterile fashion with betadine . A 14 FR cath was inserted no complications were noted , 65 ml of urine return was noted, urine was yellow in color. A clean urine sample was collected for urinalysis. Bladder was drained  And catheter was removed with out difficulty.    Performed by: Malcolm Metro RMA  Follow up/ Additional notes: f/up as scheduled

## 2023-08-25 DIAGNOSIS — H2513 Age-related nuclear cataract, bilateral: Secondary | ICD-10-CM | POA: Diagnosis not present

## 2023-08-25 DIAGNOSIS — H43813 Vitreous degeneration, bilateral: Secondary | ICD-10-CM | POA: Diagnosis not present

## 2023-08-31 ENCOUNTER — Ambulatory Visit (INDEPENDENT_AMBULATORY_CARE_PROVIDER_SITE_OTHER): Payer: PPO | Admitting: Family Medicine

## 2023-08-31 VITALS — BP 122/78 | HR 74 | Ht 66.0 in | Wt 237.0 lb

## 2023-08-31 DIAGNOSIS — E782 Mixed hyperlipidemia: Secondary | ICD-10-CM

## 2023-08-31 DIAGNOSIS — E039 Hypothyroidism, unspecified: Secondary | ICD-10-CM

## 2023-08-31 DIAGNOSIS — Z23 Encounter for immunization: Secondary | ICD-10-CM

## 2023-08-31 DIAGNOSIS — F410 Panic disorder [episodic paroxysmal anxiety] without agoraphobia: Secondary | ICD-10-CM | POA: Diagnosis not present

## 2023-08-31 DIAGNOSIS — F330 Major depressive disorder, recurrent, mild: Secondary | ICD-10-CM | POA: Diagnosis not present

## 2023-08-31 MED ORDER — ROSUVASTATIN CALCIUM 5 MG PO TABS
5.0000 mg | ORAL_TABLET | Freq: Every day | ORAL | 0 refills | Status: DC
Start: 2023-08-31 — End: 2023-10-03

## 2023-08-31 MED ORDER — SERTRALINE HCL 50 MG PO TABS
50.0000 mg | ORAL_TABLET | Freq: Every day | ORAL | 1 refills | Status: DC
Start: 2023-08-31 — End: 2024-02-27

## 2023-08-31 MED ORDER — LEVOTHYROXINE SODIUM 50 MCG PO TABS
50.0000 ug | ORAL_TABLET | Freq: Every day | ORAL | 1 refills | Status: DC
Start: 2023-08-31 — End: 2024-02-27

## 2023-08-31 MED ORDER — ALPRAZOLAM 0.25 MG PO TABS
0.2500 mg | ORAL_TABLET | ORAL | 1 refills | Status: DC | PRN
Start: 2023-08-31 — End: 2024-02-27

## 2023-08-31 NOTE — Addendum Note (Signed)
Addended by: Jerald Kief on: 08/31/2023 02:57 PM   Modules accepted: Orders

## 2023-08-31 NOTE — Progress Notes (Signed)
Date:  08/31/2023   Name:  Brenda Campbell   DOB:  07/11/1951   MRN:  [id number]   Chief Complaint: Hypothyroidism  Hyperlipidemia This is a chronic problem. The current episode started more than 1 year ago. The problem is controlled. Recent lipid tests were reviewed and are normal. Pertinent negatives include no chest pain, leg pain, myalgias or shortness of breath. Current antihyperlipidemic treatment includes statins. The current treatment provides moderate improvement of lipids. There are no compliance problems.   Anxiety Presents for follow-up visit. Symptoms include excessive worry. Patient reports no chest pain, compulsions, dizziness, insomnia, irritability, nausea, nervous/anxious behavior, palpitations or shortness of breath. The severity of symptoms is mild.    Thyroid Problem Presents for follow-up visit. Patient reports no anxiety, cold intolerance, constipation, diaphoresis, diarrhea, fatigue, hair loss, leg swelling, palpitations, visual change or weight loss. The symptoms have been stable. Her past medical history is significant for hyperlipidemia.    Lab Results  Component Value Date   NA 144 03/08/2023   K 4.1 03/08/2023   CO2 23 03/08/2023   GLUCOSE 113 (H) 03/08/2023   BUN 12 03/08/2023   CREATININE 0.70 03/08/2023   CALCIUM 8.6 (L) 03/08/2023   EGFR 92 03/08/2023   GFRNONAA 90 10/05/2020   Lab Results  Component Value Date   CHOL 239 (H) 03/08/2023   HDL 65 03/08/2023   LDLCALC 148 (H) 03/08/2023   TRIG 149 03/08/2023   CHOLHDL 4.3 10/22/2018   Lab Results  Component Value Date   TSH 2.700 03/08/2023   No results found for: "HGBA1C" Lab Results  Component Value Date   WBC 5.7 02/14/2020   HGB 14.5 09/07/2021   HCT 42.4 02/14/2020   MCV 98 (H) 02/14/2020   PLT 248 02/14/2020   Lab Results  Component Value Date   ALT 14 03/08/2023   AST 17 03/08/2023   ALKPHOS 88 03/08/2023   BILITOT 0.5 03/08/2023   No results found for:  "25OHVITD2", "25OHVITD3", "VD25OH"   Review of Systems  Constitutional:  Negative for diaphoresis, fatigue, irritability and weight loss.  Respiratory:  Negative for shortness of breath.   Cardiovascular:  Negative for chest pain and palpitations.  Gastrointestinal:  Negative for constipation, diarrhea and nausea.  Endocrine: Negative for cold intolerance.  Musculoskeletal:  Negative for myalgias.  Neurological:  Negative for dizziness.  Psychiatric/Behavioral:  The patient is not nervous/anxious and does not have insomnia.     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   Gastroesophageal reflux disease 12/22/2014   Hypothyroidism 12/22/2014   Obesity 12/22/2014   Tachycardia 12/22/2014   Malaise 12/22/2014    No Known Allergies  Past Surgical History:  Procedure Laterality Date   CESAREAN SECTION     x 1   CHOLECYSTECTOMY     COLONOSCOPY  10/12/2014   cleared for 3 years   COLONOSCOPY N/A 03/16/2017   Procedure: COLONOSCOPY;  Surgeon: Midge Minium, MD;  Location: [facility] ENDOSCOPY;  Service: Endoscopy;  Laterality: N/A;   COLONOSCOPY WITH PROPOFOL N/A 12/01/2017   Procedure: COLONOSCOPY WITH PROPOFOL;  Surgeon: Wyline Mood,  MD;  Location: [facility] ENDOSCOPY;  Service: Gastroenterology;  Laterality: N/A;   COLONOSCOPY WITH PROPOFOL N/A 04/12/2021   Procedure: COLONOSCOPY WITH PROPOFOL;  Surgeon: Wyline Mood, MD;  Location: [facility] ENDOSCOPY;  Service: Gastroenterology;  Laterality: N/A;   ESOPHAGOGASTRODUODENOSCOPY (EGD) WITH PROPOFOL N/A 03/15/2017    Procedure: ESOPHAGOGASTRODUODENOSCOPY (EGD) WITH PROPOFOL;  Surgeon: Wyline Mood, MD;  Location: [facility] ENDOSCOPY;  Service: Endoscopy;  Laterality: N/A;   ESOPHAGOGASTRODUODENOSCOPY (EGD) WITH PROPOFOL N/A 03/30/2017   Procedure: ESOPHAGOGASTRODUODENOSCOPY (EGD) WITH PROPOFOL;  Surgeon: Wyline Mood, MD;  Location: [facility] ENDOSCOPY;  Service: Endoscopy;  Laterality: N/A;  Endoscopic Capsule Placement   ESOPHAGOGASTRODUODENOSCOPY (EGD) WITH PROPOFOL N/A 12/01/2017   Procedure: ESOPHAGOGASTRODUODENOSCOPY (EGD) WITH PROPOFOL;  Surgeon: Wyline Mood, MD;  Location: [facility] ENDOSCOPY;  Service: Gastroenterology;  Laterality: N/A;   ESOPHAGOGASTRODUODENOSCOPY (EGD) WITH PROPOFOL N/A 04/12/2021   Procedure: ESOPHAGOGASTRODUODENOSCOPY (EGD) WITH PROPOFOL;  Surgeon: Wyline Mood, MD;  Location: [facility] ENDOSCOPY;  Service: Gastroenterology;  Laterality: N/A;   FRACTURE SURGERY     GALLBLADDER SURGERY     GIVENS CAPSULE STUDY N/A 03/17/2017   Procedure: GIVENS CAPSULE STUDY;  Surgeon: Midge Minium, MD;  Location: [facility] ENDOSCOPY;  Service: Endoscopy;  Laterality: N/A;   GIVENS CAPSULE STUDY N/A 12/01/2017   Procedure: GIVENS CAPSULE STUDY, 12 HOUR;  Surgeon: Wyline Mood, MD;  Location: [facility] ENDOSCOPY;  Service: Gastroenterology;  Laterality: N/A;   SMALL INTESTINE SURGERY     THROAT SURGERY     nodule on vocal chord   TIBIA FRACTURE SURGERY Right    WRIST SURGERY Right    arthritis    Social History   Tobacco Use   Smoking status: Former   Smokeless tobacco: Never  Vaping Use   Vaping status: Never Used  Substance Use Topics   Alcohol use: Yes    Alcohol/week: 9.0 standard drinks of alcohol    Types: 4 Cans of beer, 5 Shots of liquor per week   Drug use: No     Medication list has been reviewed and updated.  Current Meds  Medication Sig   ALPRAZolam (XANAX) 0.25 MG tablet Take 1 tablet (0.25 mg total) by mouth as needed.   conjugated estrogens (PREMARIN) vaginal cream Estrogen Cream Instruction  Discard applicator Apply pea sized amount to tip of finger to urethra before bed. Wash hands well after application. Use Monday, Wednesday and Friday   levothyroxine (SYNTHROID) 50 MCG tablet Take 1 tablet (50 mcg total) by mouth daily.   methenamine (HIPREX) 1 g tablet Take 1 tablet (1 g total) by mouth 2 (two) times daily with a meal.   nystatin cream (MYCOSTATIN) Apply 1 Application topically 2 (two) times daily.   omeprazole (PRILOSEC) 20 MG capsule TAKE 1 CAPSULE BY MOUTH TWICE A DAY   rosuvastatin (CRESTOR) 5 MG tablet TAKE 1 TABLET (5 MG TOTAL) BY MOUTH DAILY.   sertraline (ZOLOFT) 50 MG tablet Take 1 tablet (50 mg total) by mouth daily.       08/31/2023    2:05 PM 03/08/2023    9:24 AM 02/03/2023   10:06 AM 09/07/2022    9:34 AM  GAD 7 : Generalized Anxiety Score  Nervous, Anxious, on Edge 0 0 0 0  Control/stop worrying 0 0 0 0  Worry too much - different things 0 0 0 0  Trouble relaxing 0 0 0 0  Restless 0 0 0 0  Easily annoyed or irritable 0 0 0 0  Afraid - awful might happen 0 0 0 0  Total GAD 7  Score 0 0 0 0  Anxiety Difficulty Not difficult at all Not difficult at all Not difficult at all Not difficult at all       08/31/2023    2:05 PM 03/08/2023    9:24 AM 02/03/2023   10:06 AM  Depression screen PHQ 2/9  Decreased Interest 0 0 0  Down, Depressed, Hopeless 0 0 0  PHQ - 2 Score 0 0 0  Altered sleeping 0 0 0  Tired, decreased energy 0 0 0  Change in appetite 0 0 0  Feeling bad or failure about yourself  0 0 0  Trouble concentrating 0 0 0  Moving slowly or fidgety/restless 0 0 0  Suicidal thoughts 0 0 0  PHQ-9 Score 0 0 0  Difficult doing work/chores Not difficult at all Not difficult at all Not difficult at all    BP Readings from Last 3 Encounters:  08/31/23 122/78  08/04/23 117/70  04/30/23 (!) 146/93    Physical Exam  Wt Readings from Last 3 Encounters:  08/31/23 237 lb (107.5 kg)  08/04/23 220 lb (99.8 kg)  07/19/23 220 lb (99.8 kg)    BP 122/78    Pulse 74   Ht 5\' 6"  (1.676 m)   Wt 237 lb (107.5 kg)   SpO2 97%   BMI 38.25 kg/m   Assessment and Plan:     Elizabeth Sauer, MD

## 2023-09-01 ENCOUNTER — Encounter: Payer: Self-pay | Admitting: Family Medicine

## 2023-09-01 LAB — RENAL FUNCTION PANEL
Albumin: 4 g/dL (ref 3.8–4.8)
BUN/Creatinine Ratio: 20 (ref 12–28)
BUN: 12 mg/dL (ref 8–27)
CO2: 23 mmol/L (ref 20–29)
Calcium: 8.8 mg/dL (ref 8.7–10.3)
Chloride: 102 mmol/L (ref 96–106)
Creatinine, Ser: 0.61 mg/dL (ref 0.57–1.00)
Glucose: 103 mg/dL — ABNORMAL HIGH (ref 70–99)
Phosphorus: 3.2 mg/dL (ref 3.0–4.3)
Potassium: 3.8 mmol/L (ref 3.5–5.2)
Sodium: 140 mmol/L (ref 134–144)
eGFR: 95 mL/min/{1.73_m2} (ref >59)

## 2023-09-01 LAB — TSH: TSH: 2.4 u[IU]/mL (ref 0.450–4.500)

## 2023-09-08 ENCOUNTER — Ambulatory Visit: Payer: PPO | Admitting: Family Medicine

## 2023-09-13 ENCOUNTER — Telehealth: Payer: PPO | Admitting: Physician Assistant

## 2023-09-13 DIAGNOSIS — J069 Acute upper respiratory infection, unspecified: Secondary | ICD-10-CM | POA: Diagnosis not present

## 2023-09-13 MED ORDER — BENZONATATE 100 MG PO CAPS
100.0000 mg | ORAL_CAPSULE | Freq: Three times a day (TID) | ORAL | 0 refills | Status: DC | PRN
Start: 2023-09-13 — End: 2023-09-16

## 2023-09-13 NOTE — Progress Notes (Signed)
Message sent to patient requesting further input regarding current symptoms. Awaiting patient response.  

## 2023-09-13 NOTE — Progress Notes (Signed)

## 2023-09-13 NOTE — Progress Notes (Signed)
I have spent 5 minutes in review of e-visit questionnaire, review and updating patient chart, medical decision making and response to patient.   Mia Milan Cody Jacklynn Dehaas, PA-C    

## 2023-09-16 ENCOUNTER — Ambulatory Visit
Admission: RE | Admit: 2023-09-16 | Discharge: 2023-09-16 | Disposition: A | Discharge status: Home / Self Care | Payer: PPO | Source: Ambulatory Visit | Attending: Emergency Medicine | Admitting: Emergency Medicine

## 2023-09-16 ENCOUNTER — Ambulatory Visit (INDEPENDENT_AMBULATORY_CARE_PROVIDER_SITE_OTHER): Payer: PPO

## 2023-09-16 VITALS — BP 128/87 | HR 96 | Temp 98.2°F | Resp 15 | Ht 66.0 in | Wt 237.0 lb

## 2023-09-16 DIAGNOSIS — R062 Wheezing: Secondary | ICD-10-CM | POA: Diagnosis not present

## 2023-09-16 DIAGNOSIS — J069 Acute upper respiratory infection, unspecified: Secondary | ICD-10-CM | POA: Diagnosis not present

## 2023-09-16 DIAGNOSIS — J40 Bronchitis, not specified as acute or chronic: Secondary | ICD-10-CM | POA: Diagnosis not present

## 2023-09-16 DIAGNOSIS — R0602 Shortness of breath: Secondary | ICD-10-CM | POA: Diagnosis not present

## 2023-09-16 DIAGNOSIS — R059 Cough, unspecified: Secondary | ICD-10-CM | POA: Diagnosis not present

## 2023-09-16 MED ORDER — DOXYCYCLINE HYCLATE 100 MG PO CAPS: 100.0000 mg | ORAL_CAPSULE | Freq: Two times a day (BID) | ORAL | 0 refills | Status: AC

## 2023-09-16 MED ORDER — PROMETHAZINE-DM 6.25-15 MG/5ML PO SYRP: 5.0000 mL | ORAL_SOLUTION | Freq: Four times a day (QID) | ORAL | 0 refills | Status: DC | PRN

## 2023-09-16 MED ORDER — IPRATROPIUM BROMIDE 0.06 % NA SOLN: 2.0000 | Freq: Four times a day (QID) | NASAL | 12 refills | Status: DC

## 2023-09-16 MED ORDER — BENZONATATE 100 MG PO CAPS: 200.0000 mg | ORAL_CAPSULE | Freq: Three times a day (TID) | ORAL | 0 refills | Status: DC

## 2023-09-16 NOTE — ED Triage Notes (Signed)
Patient c/o cough and chest congestion for a week.  Patient denies fevers.  Patient states that she recently got back from an [place] cruise.

## 2023-09-16 NOTE — ED Provider Notes (Signed)
[facility]    CSN: [id number] Arrival date & time: 09/16/23  1155      History   Chief Complaint Chief Complaint  Patient presents with   Cough    HPI Congo Bova is a 72 y.o. female.   HPI  72 year old female with a past medical history significant for GI hemorrhage, MDD, PSVT, hypothyroidism, mixed hyperlipidemia, GERD, and obesity presents for evaluation of 1 week worth of respiratory symptoms which include mild runny nose nasal congestion, fatigue, and a nonproductive cough.  She reports that approximately 4 days ago she had a fever but that resolved and has not returned.  She does endorse some shortness of breath and wheezing, especially with activity.  She recently went on an [place] cruise with 6 other friends and 5 of them became ill.  2 had influenza and 1 had COVID.  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   Gastroesophageal reflux disease 12/22/2014   Hypothyroidism 12/22/2014   Obesity 12/22/2014   Tachycardia 12/22/2014   Malaise 12/22/2014    Past Surgical History:  Procedure Laterality Date   CESAREAN SECTION     x 1   CHOLECYSTECTOMY     COLONOSCOPY  10/12/2014   cleared for 3 years   COLONOSCOPY N/A 03/16/2017   Procedure:  COLONOSCOPY;  Surgeon: Midge Minium, MD;  Location: [facility] ENDOSCOPY;  Service: Endoscopy;  Laterality: N/A;   COLONOSCOPY WITH PROPOFOL N/A 12/01/2017   Procedure: COLONOSCOPY WITH PROPOFOL;  Surgeon: Wyline Mood, MD;  Location: [facility] ENDOSCOPY;  Service: Gastroenterology;  Laterality: N/A;   COLONOSCOPY WITH PROPOFOL N/A 04/12/2021   Procedure: COLONOSCOPY WITH PROPOFOL;  Surgeon: Wyline Mood, MD;  Location: [facility] ENDOSCOPY;  Service: Gastroenterology;  Laterality: N/A;   ESOPHAGOGASTRODUODENOSCOPY (EGD) WITH PROPOFOL N/A 03/15/2017   Procedure: ESOPHAGOGASTRODUODENOSCOPY (EGD) WITH PROPOFOL;  Surgeon: Wyline Mood, MD;  Location: [facility] ENDOSCOPY;  Service: Endoscopy;  Laterality: N/A;   ESOPHAGOGASTRODUODENOSCOPY (EGD) WITH PROPOFOL N/A 03/30/2017   Procedure: ESOPHAGOGASTRODUODENOSCOPY (EGD) WITH PROPOFOL;  Surgeon: Wyline Mood, MD;  Location: [facility] ENDOSCOPY;  Service: Endoscopy;  Laterality: N/A;  Endoscopic Capsule Placement   ESOPHAGOGASTRODUODENOSCOPY (EGD) WITH PROPOFOL N/A 12/01/2017   Procedure: ESOPHAGOGASTRODUODENOSCOPY (EGD) WITH PROPOFOL;  Surgeon: Wyline Mood, MD;  Location: [facility] ENDOSCOPY;  Service: Gastroenterology;  Laterality: N/A;   ESOPHAGOGASTRODUODENOSCOPY (EGD) WITH PROPOFOL N/A 04/12/2021   Procedure: ESOPHAGOGASTRODUODENOSCOPY (EGD) WITH PROPOFOL;  Surgeon: Wyline Mood, MD;  Location: [facility] ENDOSCOPY;  Service: Gastroenterology;  Laterality: N/A;   FRACTURE SURGERY     GALLBLADDER SURGERY     GIVENS CAPSULE STUDY N/A 03/17/2017   Procedure: GIVENS CAPSULE STUDY;  Surgeon: Midge Minium, MD;  Location: [facility] ENDOSCOPY;  Service: Endoscopy;  Laterality: N/A;   GIVENS CAPSULE STUDY N/A 12/01/2017   Procedure: GIVENS  CAPSULE STUDY, 12 HOUR;  Surgeon: Wyline Mood, MD;  Location: [facility] ENDOSCOPY;  Service: Gastroenterology;  Laterality: N/A;   SMALL INTESTINE SURGERY     THROAT SURGERY     nodule on vocal chord   TIBIA FRACTURE SURGERY Right    WRIST SURGERY Right    arthritis    OB  History   No obstetric history on file.      Home Medications    Prior to Admission medications   Medication Sig Start Date End Date Taking? Authorizing Provider  benzonatate (TESSALON) 100 MG capsule Take 2 capsules (200 mg total) by mouth every 8 (eight) hours. 09/16/23  Yes Becky Augusta, NP  doxycycline (VIBRAMYCIN) 100 MG capsule Take 1 capsule (100 mg total) by mouth 2 (two) times daily for 7 days. 09/16/23 09/23/23 Yes Becky Augusta, NP  ipratropium (ATROVENT) 0.06 % nasal spray Place 2 sprays into both nostrils 4 (four) times daily. 09/16/23  Yes Becky Augusta, NP  promethazine-dextromethorphan (PROMETHAZINE-DM) 6.25-15 MG/5ML syrup Take 5 mLs by mouth 4 (four) times daily as needed. 09/16/23  Yes Becky Augusta, NP  ALPRAZolam Prudy Feeler) 0.25 MG tablet Take 1 tablet (0.25 mg total) by mouth as needed. 08/31/23   Duanne Limerick, MD  conjugated estrogens (PREMARIN) vaginal cream Estrogen Cream Instruction Discard applicator Apply pea sized amount to tip of finger to urethra before bed. Wash hands well after application. Use Monday, Wednesday and Friday 08/13/21   Vanna Scotland, MD  levothyroxine (SYNTHROID) 50 MCG tablet Take 1 tablet (50 mcg total) by mouth daily. 08/31/23   Duanne Limerick, MD  methenamine (HIPREX) 1 g tablet Take 1 tablet (1 g total) by mouth 2 (two) times daily with a meal. 07/19/23   Vaillancourt, Samantha, PA-C  nystatin cream (MYCOSTATIN) Apply 1 Application topically 2 (two) times daily. 07/19/23   Vaillancourt, Lelon Mast, PA-C  omeprazole (PRILOSEC) 20 MG capsule TAKE 1 CAPSULE BY MOUTH TWICE A DAY 03/20/23   Duanne Limerick, MD  rosuvastatin (CRESTOR) 5 MG tablet Take 1 tablet (5 mg total) by mouth daily. 08/31/23   Duanne Limerick, MD  sertraline (ZOLOFT) 50 MG tablet Take 1 tablet (50 mg total) by mouth daily. 08/31/23   Duanne Limerick, MD    Family History Family History  Problem Relation Age of Onset   Diabetes Mother    Heart disease Mother    Diabetes Brother     Breast cancer Neg Hx     Social History Social History   Tobacco Use   Smoking status: Former   Smokeless tobacco: Never  Vaping Use   Vaping status: Never Used  Substance Use Topics   Alcohol use: Yes    Alcohol/week: 9.0 standard drinks of alcohol    Types: 4 Cans of beer, 5 Shots of liquor per week   Drug use: No     Allergies   Patient has no known allergies.   Review of Systems Review of Systems  Constitutional:  Positive for fatigue and fever.  HENT:  Positive for congestion and rhinorrhea. Negative for ear pain and sore throat.   Respiratory:  Positive for cough, shortness of breath and wheezing.      Physical Exam Triage Vital Signs ED Triage Vitals  Encounter Vitals Group     BP      Systolic BP Percentile      Diastolic BP Percentile      Pulse      Resp      Temp  Temp src      SpO2      Weight      Height      Head Circumference      Peak Flow      Pain Score      Pain Loc      Pain Education      Exclude from Growth Chart    No data found.  Updated Vital Signs BP 128/87 (BP Location: Left Arm)   Pulse 96   Temp 98.2 F (36.8 C) (Oral)   Resp 15   Ht 5\' 6"  (1.676 m)   Wt 236 lb 15.9 oz (107.5 kg)   SpO2 96%   BMI 38.25 kg/m   Visual Acuity Right Eye Distance:   Left Eye Distance:   Bilateral Distance:    Right Eye Near:   Left Eye Near:    Bilateral Near:     Physical Exam Vitals and nursing note reviewed.  Constitutional:      Appearance: Normal appearance. She is ill-appearing.  HENT:     Head: Normocephalic and atraumatic.     Right Ear: Tympanic membrane, ear canal and external ear normal. There is no impacted cerumen.     Left Ear: Tympanic membrane, ear canal and external ear normal. There is no impacted cerumen.     Nose: Congestion and rhinorrhea present.     Comments: This mucosa is erythematous and edematous with scant clear discharge in both nares.    Mouth/Throat:     Mouth: Mucous membranes are moist.      Pharynx: Oropharynx is clear. Posterior oropharyngeal erythema present. No oropharyngeal exudate.     Comments: Mild erythema to the posterior pharynx with clear postnasal drip. Cardiovascular:     Rate and Rhythm: Normal rate and regular rhythm.     Pulses: Normal pulses.     Heart sounds: Normal heart sounds. No murmur heard.    No friction rub. No gallop.  Pulmonary:     Effort: Pulmonary effort is normal.     Breath sounds: Wheezing and rhonchi present. No rales.     Comments: Patient has decreased lung sounds diffusely.  She does have wheezes and rhonchi present with cough. Chest:     Chest wall: No tenderness.  Musculoskeletal:     Cervical back: Normal range of motion and neck supple.  Lymphadenopathy:     Cervical: No cervical adenopathy.  Skin:    General: Skin is warm and dry.     Capillary Refill: Capillary refill takes less than 2 seconds.  Neurological:     General: No focal deficit present.     Mental Status: She is alert and oriented to person, place, and time.  Psychiatric:        Mood and Affect: Mood normal.        Behavior: Behavior normal.        Thought Content: Thought content normal.        Judgment: Judgment normal.      UC Treatments / Results  Labs (all labs ordered are listed, but only abnormal results are displayed) Labs Reviewed - No data to display  EKG   Radiology No results found.  Procedures Procedures (including critical care time)  Medications Ordered in UC Medications - No data to display  Initial Impression / Assessment and Plan / UC Course  I have reviewed the triage vital signs and the nursing notes.  Pertinent labs & imaging results that were available during my care  of the patient were reviewed by me and considered in my medical decision making (see chart for details).   Patient is a pleasant, though mildly ill-appearing, 72 year old female presenting for evaluation of 1 week worth of respiratory symptoms as outlined  HPI above.  She is able to speak in full sentences without dyspnea or tachypnea.  Respiratory rate at triage was 15 with a 96% room air oxygen saturation.  She is afebrile with an oral temp of 98.2.  She recently returned from [place] cruise with 6 other friends and 5 people from the trip became sick, 2 with influenza and 1 with COVID.  Given that she has had symptoms for greater than a week I will not test her for either 1 as she is outside the therapeutic window.  Given her lung sounds I suspect that she has bronchitis though I will obtain a chest x-ray to evaluate for any other acute cardiopulmonary process.  Chest x-ray independently reviewed and evaluated by me.  Impression: The lung fields are well aerated and there is no evidence of infiltrate or effusion.  Radiology overread is pending.  I will discharge patient home with a diagnosis of URI and bronchitis.  I will cover her bronchitis with doxycycline 100 mg twice daily for 10 days and also prescribe Atrovent nasal spray to help with the congestion along with Tessalon Perles and Promethazine DM cough syrup for cough and congestion.  If her symptoms worsen, or she develops new symptoms, she can return for reevaluation or see your PCP.   Final Clinical Impressions(s) / UC Diagnoses   Final diagnoses:  Upper respiratory tract infection, unspecified type  Bronchitis     Discharge Instructions      Your chest x-ray did not show any evidence of pneumonia.  Your exam is consistent with an upper respiratory infection and also bronchitis.  Take the doxycycline 100 mg twice daily with food for 7 days for treatment of your bronchitis.  Use the Atrovent nasal spray, 2 squirts up each nostril every 6 hours as needed for nasal congestion and postnasal drip.  Use the Tessalon Perles every 8 hours during the day as needed for cough.  Take them with a small sip of water.  You may experience numbness at the base of your tongue or metallic taste in her  mouth, this is normal.  I will prescribe Promethazine DM cough syrup that she can use at bedtime.  This medication will make you sleepy so if you have to get up in the middle of night to use the bathroom make sure that you have your faculties about you before you attempt to stand up and walk.  If you develop any new or worsening symptoms please return for reevaluation or see your primary care provider.     ED Prescriptions     Medication Sig Dispense Auth. Provider   benzonatate (TESSALON) 100 MG capsule Take 2 capsules (200 mg total) by mouth every 8 (eight) hours. 21 capsule Becky Augusta, NP   doxycycline (VIBRAMYCIN) 100 MG capsule Take 1 capsule (100 mg total) by mouth 2 (two) times daily for 7 days. 14 capsule Becky Augusta, NP   ipratropium (ATROVENT) 0.06 % nasal spray Place 2 sprays into both nostrils 4 (four) times daily. 15 mL Becky Augusta, NP   promethazine-dextromethorphan (PROMETHAZINE-DM) 6.25-15 MG/5ML syrup Take 5 mLs by mouth 4 (four) times daily as needed. 118 mL Becky Augusta, NP      PDMP not reviewed this encounter.   Alycia Rossetti,  Riki Rusk, NP 09/16/23 1255

## 2023-09-16 NOTE — Discharge Instructions (Addendum)
Your chest x-ray did not show any evidence of pneumonia.  Your exam is consistent with an upper respiratory infection and also bronchitis.  Take the doxycycline 100 mg twice daily with food for 7 days for treatment of your bronchitis.  Use the Atrovent nasal spray, 2 squirts up each nostril every 6 hours as needed for nasal congestion and postnasal drip.  Use the Tessalon Perles every 8 hours during the day as needed for cough.  Take them with a small sip of water.  You may experience numbness at the base of your tongue or metallic taste in her mouth, this is normal.  I will prescribe Promethazine DM cough syrup that she can use at bedtime.  This medication will make you sleepy so if you have to get up in the middle of night to use the bathroom make sure that you have your faculties about you before you attempt to stand up and walk.  If you develop any new or worsening symptoms please return for reevaluation or see your primary care provider.

## 2023-10-03 ENCOUNTER — Ambulatory Visit: Payer: PPO | Admitting: Obstetrics and Gynecology

## 2023-10-03 ENCOUNTER — Encounter: Payer: Self-pay | Admitting: Obstetrics and Gynecology

## 2023-10-03 VITALS — BP 100/72 | Ht 67.0 in | Wt 232.0 lb

## 2023-10-03 DIAGNOSIS — L292 Pruritus vulvae: Secondary | ICD-10-CM | POA: Diagnosis not present

## 2023-10-03 MED ORDER — CLOTRIMAZOLE-BETAMETHASONE 1-0.05 % EX CREA
TOPICAL_CREAM | CUTANEOUS | 0 refills | Status: DC
Start: 2023-10-03 — End: 2024-01-20

## 2023-10-03 NOTE — Progress Notes (Signed)
Duanne Limerick, MD   Chief Complaint  Patient presents with   Referral    Vaginal itching x years    HPI:      Ms. Brenda Campbell is a 72 y.o. No obstetric history on file. whose LMP was No LMP recorded. Patient is postmenopausal., presents today for NP eval of chronic vaginal itching for years, referred by urology. On exam, she had irregular patchy areas of hypopigmentation along the bilateral labia minora that made him question lichen sclerosus. Concerned because vulvar LS can mimic GSM symptoms. Pt being seen for recurrent vs persistent UTI; on prev vag crm externally 3 times wkly. Has had intermittent vag itching for years on bilat labia minor and majora and perineal/perianal area. Has treated with nystatin crm with some relief, OTC gels, ice packs, etc. Sx improve temporarily but then recur. Uses water to wash, no dryer sheets, wearing cotton underwear, wears pads occas for incont sx. No PMB, no hx of abn paps. Not sexually active. No increased vag d/c, odor.   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   Gastroesophageal reflux disease 12/22/2014   Hypothyroidism 12/22/2014   Obesity 12/22/2014   Tachycardia 12/22/2014   Malaise 12/22/2014    Past Surgical History:  Procedure Laterality Date   CESAREAN SECTION     x 1   CHOLECYSTECTOMY     COLONOSCOPY  10/12/2014   cleared for 3 years   COLONOSCOPY N/A 03/16/2017   Procedure: COLONOSCOPY;  Surgeon: Midge Minium, MD;  Location: [facility] ENDOSCOPY;  Service: Endoscopy;  Laterality:  N/A;   COLONOSCOPY WITH PROPOFOL N/A 12/01/2017   Procedure: COLONOSCOPY WITH PROPOFOL;  Surgeon: Wyline Mood, MD;  Location: [facility] ENDOSCOPY;  Service: Gastroenterology;  Laterality: N/A;   COLONOSCOPY WITH PROPOFOL N/A 04/12/2021   Procedure: COLONOSCOPY WITH PROPOFOL;  Surgeon: Wyline Mood, MD;  Location: [facility] ENDOSCOPY;  Service: Gastroenterology;  Laterality: N/A;   ESOPHAGOGASTRODUODENOSCOPY (EGD) WITH PROPOFOL N/A 03/15/2017   Procedure: ESOPHAGOGASTRODUODENOSCOPY (EGD) WITH PROPOFOL;  Surgeon: Wyline Mood, MD;  Location: [facility] ENDOSCOPY;  Service: Endoscopy;  Laterality: N/A;   ESOPHAGOGASTRODUODENOSCOPY (EGD) WITH PROPOFOL N/A 03/30/2017   Procedure: ESOPHAGOGASTRODUODENOSCOPY (EGD) WITH PROPOFOL;  Surgeon: Wyline Mood, MD;  Location: [facility] ENDOSCOPY;  Service: Endoscopy;  Laterality: N/A;  Endoscopic Capsule Placement   ESOPHAGOGASTRODUODENOSCOPY (EGD) WITH PROPOFOL N/A 12/01/2017   Procedure: ESOPHAGOGASTRODUODENOSCOPY (EGD) WITH PROPOFOL;  Surgeon: Wyline Mood, MD;  Location: [facility] ENDOSCOPY;  Service: Gastroenterology;  Laterality: N/A;   ESOPHAGOGASTRODUODENOSCOPY (EGD) WITH PROPOFOL N/A 04/12/2021   Procedure: ESOPHAGOGASTRODUODENOSCOPY (EGD) WITH PROPOFOL;  Surgeon: Wyline Mood, MD;  Location: [facility] ENDOSCOPY;  Service: Gastroenterology;  Laterality: N/A;   FRACTURE SURGERY     GALLBLADDER SURGERY     GIVENS CAPSULE STUDY N/A 03/17/2017   Procedure: GIVENS CAPSULE STUDY;  Surgeon: Midge Minium, MD;  Location: [facility] ENDOSCOPY;  Service: Endoscopy;  Laterality: N/A;   GIVENS CAPSULE STUDY N/A 12/01/2017   Procedure: GIVENS CAPSULE STUDY, 12 HOUR;  Surgeon: Wyline Mood, MD;  Location: [facility] ENDOSCOPY;  Service: Gastroenterology;  Laterality: N/A;   SMALL INTESTINE SURGERY  THROAT SURGERY     nodule on vocal chord   TIBIA FRACTURE SURGERY Right    WRIST SURGERY Right    arthritis    Family History  Problem Relation Age of Onset   Diabetes Mother    Heart disease Mother    Diabetes  Brother    Breast cancer Neg Hx     Social History   Socioeconomic History   Marital status: Married    Spouse name: Not on file   Number of children: Not on file   Years of education: Not on file   Highest education level: Associate degree: academic program  Occupational History   Not on file  Tobacco Use   Smoking status: Former   Smokeless tobacco: Never  Vaping Use   Vaping status: Never Used  Substance and Sexual Activity   Alcohol use: Yes    Alcohol/week: 9.0 standard drinks of alcohol    Types: 4 Cans of beer, 5 Shots of liquor per week   Drug use: No   Sexual activity: Not Currently  Other Topics Concern   Not on file  Social History Narrative   Not on file   Social Determinants of Health   Financial Resource Strain: Low Risk  (08/31/2023)   Overall Financial Resource Strain (CARDIA)    Difficulty of Paying Living Expenses: Not hard at all  Food Insecurity: No Food Insecurity (08/31/2023)   Hunger Vital Sign    Worried About Running Out of Food in the Last Year: Never true    Ran Out of Food in the Last Year: Never true  Transportation Needs: No Transportation Needs (08/31/2023)   PRAPARE - Administrator, Civil Service (Medical): No    Lack of Transportation (Non-Medical): No  Physical Activity: Insufficiently Active (08/31/2023)   Exercise Vital Sign    Days of Exercise per Week: 1 day    Minutes of Exercise per Session: 30 min  Stress: No Stress Concern Present (08/31/2023)   [employer] of Occupational Health - Occupational Stress Questionnaire    Feeling of Stress : Only a little  Social Connections: Unknown (08/31/2023)   Social Connection and Isolation Panel [NHANES]    Frequency of Communication with Friends and Family: More than three times a week    Frequency of Social Gatherings with Friends and Family: Once a week    Attends Religious Services: Patient declined    Database administrator or Organizations: No    Attends Museum/gallery exhibitions officer: 1 to 4 times per year    Marital Status: Married  Catering manager Violence: Not At Risk (10/26/2022)   Humiliation, Afraid, Rape, and Kick questionnaire    Fear of Current or Ex-Partner: No    Emotionally Abused: No    Physically Abused: No    Sexually Abused: No    Outpatient Medications Prior to Visit  Medication Sig Dispense Refill   ALPRAZolam (XANAX) 0.25 MG tablet Take 1 tablet (0.25 mg total) by mouth as needed. 30 tablet 1   conjugated estrogens (PREMARIN) vaginal cream Estrogen Cream Instruction Discard applicator Apply pea sized amount to tip of finger to urethra before bed. Wash hands well after application. Use Monday, Wednesday and Friday 42.5 g 12   ipratropium (ATROVENT) 0.06 % nasal spray Place 2 sprays into both nostrils 4 (four) times daily. 15 mL 12   levothyroxine (SYNTHROID) 50 MCG tablet Take 1 tablet (50 mcg total) by mouth daily. 90 tablet 1   methenamine (HIPREX)  1 g tablet Take 1 tablet (1 g total) by mouth 2 (two) times daily with a meal. 60 tablet 11   nystatin cream (MYCOSTATIN) Apply 1 Application topically 2 (two) times daily. 30 g 0   omeprazole (PRILOSEC) 20 MG capsule TAKE 1 CAPSULE BY MOUTH TWICE A DAY 180 capsule 1   promethazine-dextromethorphan (PROMETHAZINE-DM) 6.25-15 MG/5ML syrup Take 5 mLs by mouth 4 (four) times daily as needed. 118 mL 0   sertraline (ZOLOFT) 50 MG tablet Take 1 tablet (50 mg total) by mouth daily. 90 tablet 1   benzonatate (TESSALON) 100 MG capsule Take 2 capsules (200 mg total) by mouth every 8 (eight) hours. 21 capsule 0   rosuvastatin (CRESTOR) 5 MG tablet Take 1 tablet (5 mg total) by mouth daily. 90 tablet 0   No facility-administered medications prior to visit.      ROS:  Review of Systems  Constitutional:  Negative for fever.  Gastrointestinal:  Negative for blood in stool, constipation, diarrhea, nausea and vomiting.  Genitourinary:  Negative for dyspareunia, dysuria, flank pain, frequency,  hematuria, urgency, vaginal bleeding, vaginal discharge and vaginal pain.  Musculoskeletal:  Negative for back pain.  Skin:  Negative for rash.   BREAST: No symptoms   OBJECTIVE:   Vitals:  BP 100/72   Ht 5\' 7"  (1.702 m)   Wt 232 lb (105.2 kg)   BMI 36.34 kg/m   Physical Exam Vitals reviewed.  Constitutional:      Appearance: She is well-developed.  Pulmonary:     Effort: Pulmonary effort is normal.  Genitourinary:    Labia:        Right: Rash present. No tenderness or lesion.        Left: Rash present. No tenderness or lesion.     Musculoskeletal:        General: Normal range of motion.     Cervical back: Normal range of motion.  Skin:    General: Skin is warm and dry.  Neurological:     General: No focal deficit present.     Mental Status: She is alert and oriented to person, place, and time.     Cranial Nerves: No cranial nerve deficit.  Psychiatric:        Mood and Affect: Mood normal.        Behavior: Behavior normal.        Thought Content: Thought content normal.        Judgment: Judgment normal.     Assessment/Plan: Vulvar itching - Plan: clotrimazole-betamethasone (LOTRISONE) cream; question LS vs fungal derm. Treat with lotrisone crm BID for 2 wks, then RTO for re-eval. If not resolved, will do vulvar bx.    Meds ordered this encounter  Medications   clotrimazole-betamethasone (LOTRISONE) cream    Sig: Apply externally BID for 2 wks    Dispense:  45 g    Refill:  0    Order Specific Question:   Supervising Provider    Answer:   Hildred Laser [[id number]]      Return in about 13 days (around 10/16/2023) for vaginitis f/u.  Ciera Beckum B. Johnmark Geiger, PA-C 10/03/2023 4:27 PM

## 2023-10-03 NOTE — Patient Instructions (Signed)
I value your feedback and you entrusting us with your care. If you get a [facility] patient survey, I would appreciate you taking the time to let us know about your experience today. Thank you! ? ? ?

## 2023-10-13 ENCOUNTER — Other Ambulatory Visit: Payer: Self-pay | Admitting: Family Medicine

## 2023-10-13 DIAGNOSIS — K219 Gastro-esophageal reflux disease without esophagitis: Secondary | ICD-10-CM

## 2023-10-13 DIAGNOSIS — K2900 Acute gastritis without bleeding: Secondary | ICD-10-CM

## 2023-10-15 NOTE — Progress Notes (Unsigned)
Duanne Limerick, MD   No chief complaint on file.   HPI:      Ms. Brenda Campbell is a 72 y.o. No obstetric history on file. whose LMP was No LMP recorded. Patient is postmenopausal., presents today for NP eval of chronic vaginal itching for years, referred by urology. On exam, she had irregular patchy areas of hypopigmentation along the bilateral labia minora that made him question lichen sclerosus. Concerned because vulvar LS can mimic GSM symptoms. Pt being seen for recurrent vs persistent UTI; on prev vag crm externally 3 times wkly. Has had intermittent vag itching for years on bilat labia minor and majora and perineal/perianal area. Has treated with nystatin crm with some relief, OTC gels, ice packs, etc. Sx improve temporarily but then recur. Uses water to wash, no dryer sheets, wearing cotton underwear, wears pads occas for incont sx. No PMB, no hx of abn paps. Not sexually active. No increased vag d/c, odor.   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   Gastroesophageal reflux disease 12/22/2014   Hypothyroidism 12/22/2014   Obesity 12/22/2014   Tachycardia 12/22/2014   Malaise 12/22/2014    Past Surgical History:  Procedure Laterality Date   CESAREAN SECTION     x 1   CHOLECYSTECTOMY     COLONOSCOPY  10/12/2014   cleared for 3 years   COLONOSCOPY N/A 03/16/2017   Procedure: COLONOSCOPY;  Surgeon: Midge Minium, MD;  Location: [facility] ENDOSCOPY;  Service: Endoscopy;  Laterality: N/A;   COLONOSCOPY WITH PROPOFOL N/A 12/01/2017    Procedure: COLONOSCOPY WITH PROPOFOL;  Surgeon: Wyline Mood, MD;  Location: [facility] ENDOSCOPY;  Service: Gastroenterology;  Laterality: N/A;   COLONOSCOPY WITH PROPOFOL N/A 04/12/2021   Procedure: COLONOSCOPY WITH PROPOFOL;  Surgeon: Wyline Mood, MD;  Location: [facility] ENDOSCOPY;  Service: Gastroenterology;  Laterality: N/A;   ESOPHAGOGASTRODUODENOSCOPY (EGD) WITH PROPOFOL N/A 03/15/2017   Procedure: ESOPHAGOGASTRODUODENOSCOPY (EGD) WITH PROPOFOL;  Surgeon: Wyline Mood, MD;  Location: [facility] ENDOSCOPY;  Service: Endoscopy;  Laterality: N/A;   ESOPHAGOGASTRODUODENOSCOPY (EGD) WITH PROPOFOL N/A 03/30/2017   Procedure: ESOPHAGOGASTRODUODENOSCOPY (EGD) WITH PROPOFOL;  Surgeon: Wyline Mood, MD;  Location: [facility] ENDOSCOPY;  Service: Endoscopy;  Laterality: N/A;  Endoscopic Capsule Placement   ESOPHAGOGASTRODUODENOSCOPY (EGD) WITH PROPOFOL N/A 12/01/2017   Procedure: ESOPHAGOGASTRODUODENOSCOPY (EGD) WITH PROPOFOL;  Surgeon: Wyline Mood, MD;  Location: [facility] ENDOSCOPY;  Service: Gastroenterology;  Laterality: N/A;   ESOPHAGOGASTRODUODENOSCOPY (EGD) WITH PROPOFOL N/A 04/12/2021   Procedure: ESOPHAGOGASTRODUODENOSCOPY (EGD) WITH PROPOFOL;  Surgeon: Wyline Mood, MD;  Location: [facility] ENDOSCOPY;  Service: Gastroenterology;  Laterality: N/A;   FRACTURE SURGERY     GALLBLADDER SURGERY     GIVENS CAPSULE STUDY N/A 03/17/2017   Procedure: GIVENS CAPSULE STUDY;  Surgeon: Midge Minium, MD;  Location: [facility] ENDOSCOPY;  Service: Endoscopy;  Laterality: N/A;   GIVENS CAPSULE STUDY N/A 12/01/2017   Procedure: GIVENS CAPSULE STUDY, 12 HOUR;  Surgeon: Wyline Mood, MD;  Location: [facility] ENDOSCOPY;  Service: Gastroenterology;  Laterality: N/A;   SMALL INTESTINE SURGERY     THROAT SURGERY     nodule on vocal chord  TIBIA FRACTURE SURGERY Right    WRIST SURGERY Right    arthritis    Family History  Problem Relation Age of Onset   Diabetes Mother    Heart disease Mother    Diabetes Brother    Breast cancer Neg Hx     Social  History   Socioeconomic History   Marital status: Married    Spouse name: Not on file   Number of children: Not on file   Years of education: Not on file   Highest education level: Associate degree: academic program  Occupational History   Not on file  Tobacco Use   Smoking status: Former   Smokeless tobacco: Never  Vaping Use   Vaping status: Never Used  Substance and Sexual Activity   Alcohol use: Yes    Alcohol/week: 9.0 standard drinks of alcohol    Types: 4 Cans of beer, 5 Shots of liquor per week   Drug use: No   Sexual activity: Not Currently  Other Topics Concern   Not on file  Social History Narrative   Not on file   Social Determinants of Health   Financial Resource Strain: Low Risk  (08/31/2023)   Overall Financial Resource Strain (CARDIA)    Difficulty of Paying Living Expenses: Not hard at all  Food Insecurity: No Food Insecurity (08/31/2023)   Hunger Vital Sign    Worried About Running Out of Food in the Last Year: Never true    Ran Out of Food in the Last Year: Never true  Transportation Needs: No Transportation Needs (08/31/2023)   PRAPARE - Administrator, Civil Service (Medical): No    Lack of Transportation (Non-Medical): No  Physical Activity: Insufficiently Active (08/31/2023)   Exercise Vital Sign    Days of Exercise per Week: 1 day    Minutes of Exercise per Session: 30 min  Stress: No Stress Concern Present (08/31/2023)   [employer] of Occupational Health - Occupational Stress Questionnaire    Feeling of Stress : Only a little  Social Connections: Unknown (08/31/2023)   Social Connection and Isolation Panel [NHANES]    Frequency of Communication with Friends and Family: More than three times a week    Frequency of Social Gatherings with Friends and Family: Once a week    Attends Religious Services: Patient declined    Database administrator or Organizations: No    Attends Engineer, structural: 1 to 4 times per year     Marital Status: Married  Catering manager Violence: Not At Risk (10/26/2022)   Humiliation, Afraid, Rape, and Kick questionnaire    Fear of Current or Ex-Partner: No    Emotionally Abused: No    Physically Abused: No    Sexually Abused: No    Outpatient Medications Prior to Visit  Medication Sig Dispense Refill   ALPRAZolam (XANAX) 0.25 MG tablet Take 1 tablet (0.25 mg total) by mouth as needed. 30 tablet 1   clotrimazole-betamethasone (LOTRISONE) cream Apply externally BID for 2 wks 45 g 0   conjugated estrogens (PREMARIN) vaginal cream Estrogen Cream Instruction Discard applicator Apply pea sized amount to tip of finger to urethra before bed. Wash hands well after application. Use Monday, Wednesday and Friday 42.5 g 12   ipratropium (ATROVENT) 0.06 % nasal spray Place 2 sprays into both nostrils 4 (four) times daily. 15 mL 12   levothyroxine (SYNTHROID) 50 MCG tablet Take 1 tablet (50 mcg total) by mouth daily. 90 tablet 1  methenamine (HIPREX) 1 g tablet Take 1 tablet (1 g total) by mouth 2 (two) times daily with a meal. 60 tablet 11   nystatin cream (MYCOSTATIN) Apply 1 Application topically 2 (two) times daily. 30 g 0   omeprazole (PRILOSEC) 20 MG capsule TAKE 1 CAPSULE BY MOUTH TWICE A DAY 180 capsule 1   promethazine-dextromethorphan (PROMETHAZINE-DM) 6.25-15 MG/5ML syrup Take 5 mLs by mouth 4 (four) times daily as needed. 118 mL 0   sertraline (ZOLOFT) 50 MG tablet Take 1 tablet (50 mg total) by mouth daily. 90 tablet 1   No facility-administered medications prior to visit.      ROS:  Review of Systems  Constitutional:  Negative for fever.  Gastrointestinal:  Negative for blood in stool, constipation, diarrhea, nausea and vomiting.  Genitourinary:  Negative for dyspareunia, dysuria, flank pain, frequency, hematuria, urgency, vaginal bleeding, vaginal discharge and vaginal pain.  Musculoskeletal:  Negative for back pain.  Skin:  Negative for rash.   BREAST: No  symptoms   OBJECTIVE:   Vitals:  There were no vitals taken for this visit.  Physical Exam Vitals reviewed.  Constitutional:      Appearance: She is well-developed.  Pulmonary:     Effort: Pulmonary effort is normal.  Genitourinary:    Labia:        Right: Rash present. No tenderness or lesion.        Left: Rash present. No tenderness or lesion.     Musculoskeletal:        General: Normal range of motion.     Cervical back: Normal range of motion.  Skin:    General: Skin is warm and dry.  Neurological:     General: No focal deficit present.     Mental Status: She is alert and oriented to person, place, and time.     Cranial Nerves: No cranial nerve deficit.  Psychiatric:        Mood and Affect: Mood normal.        Behavior: Behavior normal.        Thought Content: Thought content normal.        Judgment: Judgment normal.     Assessment/Plan: Vulvar itching - Plan: clotrimazole-betamethasone (LOTRISONE) cream; question LS vs fungal derm. Treat with lotrisone crm BID for 2 wks, then RTO for re-eval. If not resolved, will do vulvar bx.    No orders of the defined types were placed in this encounter.     No follow-ups on file.  Alma Mohiuddin B. Darelle Kings, PA-C 10/15/2023 1:59 PM

## 2023-10-16 ENCOUNTER — Encounter: Payer: Self-pay | Admitting: Obstetrics and Gynecology

## 2023-10-16 ENCOUNTER — Ambulatory Visit: Payer: PPO | Admitting: Obstetrics and Gynecology

## 2023-10-16 VITALS — BP 118/80 | Ht 67.0 in | Wt 233.0 lb

## 2023-10-16 DIAGNOSIS — N390 Urinary tract infection, site not specified: Secondary | ICD-10-CM | POA: Diagnosis not present

## 2023-10-16 DIAGNOSIS — L292 Pruritus vulvae: Secondary | ICD-10-CM | POA: Diagnosis not present

## 2023-10-16 LAB — POCT URINALYSIS DIPSTICK
Bilirubin, UA: NEGATIVE
Glucose, UA: NEGATIVE
Ketones, UA: NEGATIVE
Nitrite, UA: NEGATIVE
Protein, UA: NEGATIVE
Spec Grav, UA: 1.02 (ref 1.010–1.025)
pH, UA: 5 (ref 5.0–8.0)

## 2023-10-16 MED ORDER — SULFAMETHOXAZOLE-TRIMETHOPRIM 800-160 MG PO TABS: 1.0000 | ORAL_TABLET | Freq: Two times a day (BID) | ORAL | 0 refills | Status: AC

## 2023-10-18 LAB — URINE CULTURE

## 2023-11-03 ENCOUNTER — Ambulatory Visit (INDEPENDENT_AMBULATORY_CARE_PROVIDER_SITE_OTHER): Payer: PPO

## 2023-11-03 DIAGNOSIS — Z23 Encounter for immunization: Secondary | ICD-10-CM

## 2023-11-06 ENCOUNTER — Ambulatory Visit (INDEPENDENT_AMBULATORY_CARE_PROVIDER_SITE_OTHER): Payer: PPO | Admitting: Emergency Medicine

## 2023-11-06 VITALS — Ht 67.0 in | Wt 220.0 lb

## 2023-11-06 DIAGNOSIS — Z780 Asymptomatic menopausal state: Secondary | ICD-10-CM | POA: Diagnosis not present

## 2023-11-06 DIAGNOSIS — Z0000 Encounter for general adult medical examination without abnormal findings: Secondary | ICD-10-CM

## 2023-11-06 NOTE — Progress Notes (Signed)
Subjective:   Brenda Campbell is a 72 y.o. female who presents for Medicare Annual (Subsequent) preventive examination.  Visit Complete: Virtual I connected with  Brenda Campbell on 11/06/23 by a audio enabled telemedicine application and verified that I am speaking with the correct person using two identifiers.  Patient Location: Home  Provider Location: Home Office  I discussed the limitations of evaluation and management by telemedicine. The patient expressed understanding and agreed to proceed.  Vital Signs: Because this visit was a virtual/telehealth visit, some criteria may be missing or patient reported. Any vitals not documented were not able to be obtained and vitals that have been documented are patient reported.  Patient Medicare AWV questionnaire was completed by the patient on 11/05/23; I have confirmed that all information answered by patient is correct and no changes since this date.  Cardiac Risk Factors include: advanced age (>86men, >79 women);dyslipidemia;obesity (BMI >30kg/m2);Other (see comment), Risk factor comments: SVT     Objective:    Today's Vitals   11/05/23 1717 11/06/23 1324  Weight:  220 lb (99.8 kg)  Height:  5\' 7"  (1.702 m)  PainSc: 3     Body mass index is 34.46 kg/m.     11/06/2023    1:46 PM 09/16/2023   12:22 PM 10/26/2022    3:20 PM 10/20/2021    1:38 PM 04/12/2021    8:40 AM 09/30/2020    3:41 PM 01/27/2020   12:19 PM  Advanced Directives  Does Patient Have a Medical Advance Directive? No No Yes No No No No  Does patient want to make changes to medical advance directive?   No - Patient declined      Would patient like information on creating a medical advance directive? Yes (MAU/Ambulatory/Procedural Areas - Information given)   No - Patient declined  Yes (MAU/Ambulatory/Procedural Areas - Information given) No - Patient declined    Current Medications (verified) Outpatient Encounter Medications as of 11/06/2023   Medication Sig   ALPRAZolam (XANAX) 0.25 MG tablet Take 1 tablet (0.25 mg total) by mouth as needed.   conjugated estrogens (PREMARIN) vaginal cream Estrogen Cream Instruction Discard applicator Apply pea sized amount to tip of finger to urethra before bed. Wash hands well after application. Use Monday, Wednesday and Friday   Cyanocobalamin (VITAMIN B-12 SL) Place 2,000 mcg under the tongue daily.   ipratropium (ATROVENT) 0.06 % nasal spray Place 2 sprays into both nostrils 4 (four) times daily.   levothyroxine (SYNTHROID) 50 MCG tablet Take 1 tablet (50 mcg total) by mouth daily.   magnesium gluconate (MAGONATE) 500 MG tablet Take 500 mg by mouth daily.   methenamine (HIPREX) 1 g tablet Take 1 tablet (1 g total) by mouth 2 (two) times daily with a meal.   omeprazole (PRILOSEC) 20 MG capsule TAKE 1 CAPSULE BY MOUTH TWICE A DAY   Probiotic Product (PROBIOTIC DAILY) CAPS Take 1 capsule by mouth daily.   sertraline (ZOLOFT) 50 MG tablet Take 1 tablet (50 mg total) by mouth daily.   UNABLE TO FIND [place] 1000mg  1 tablet daily   clotrimazole-betamethasone (LOTRISONE) cream Apply externally BID for 2 wks (Patient not taking: Reported on 11/06/2023)   nystatin cream (MYCOSTATIN) Apply 1 Application topically 2 (two) times daily.   promethazine-dextromethorphan (PROMETHAZINE-DM) 6.25-15 MG/5ML syrup Take 5 mLs by mouth 4 (four) times daily as needed. (Patient not taking: Reported on 11/06/2023)   No facility-administered encounter medications on file as of 11/06/2023.    Allergies (verified) Patient  has no known allergies.   History: Past Medical History:  Diagnosis Date   Anxiety    Arthritis    Blood transfusion without reported diagnosis    Cancer (HCC)    small bowel ca   Depression    GERD (gastroesophageal reflux disease)    Hyperlipidemia    Hypertension    Thyroid disease    Past Surgical History:  Procedure Laterality Date   CESAREAN SECTION     x 1   CHOLECYSTECTOMY      COLONOSCOPY  10/12/2014   cleared for 3 years   COLONOSCOPY N/A 03/16/2017   Procedure: COLONOSCOPY;  Surgeon: Midge Minium, MD;  Location: [facility] ENDOSCOPY;  Service: Endoscopy;  Laterality: N/A;   COLONOSCOPY WITH PROPOFOL N/A 12/01/2017   Procedure: COLONOSCOPY WITH PROPOFOL;  Surgeon: Wyline Mood, MD;  Location: [facility] ENDOSCOPY;  Service: Gastroenterology;  Laterality: N/A;   COLONOSCOPY WITH PROPOFOL N/A 04/12/2021   Procedure: COLONOSCOPY WITH PROPOFOL;  Surgeon: Wyline Mood, MD;  Location: [facility] ENDOSCOPY;  Service: Gastroenterology;  Laterality: N/A;   ESOPHAGOGASTRODUODENOSCOPY (EGD) WITH PROPOFOL N/A 03/15/2017   Procedure: ESOPHAGOGASTRODUODENOSCOPY (EGD) WITH PROPOFOL;  Surgeon: Wyline Mood, MD;  Location: [facility] ENDOSCOPY;  Service: Endoscopy;  Laterality: N/A;   ESOPHAGOGASTRODUODENOSCOPY (EGD) WITH PROPOFOL N/A 03/30/2017   Procedure: ESOPHAGOGASTRODUODENOSCOPY (EGD) WITH PROPOFOL;  Surgeon: Wyline Mood, MD;  Location: [facility] ENDOSCOPY;  Service: Endoscopy;  Laterality: N/A;  Endoscopic Capsule Placement   ESOPHAGOGASTRODUODENOSCOPY (EGD) WITH PROPOFOL N/A 12/01/2017   Procedure: ESOPHAGOGASTRODUODENOSCOPY (EGD) WITH PROPOFOL;  Surgeon: Wyline Mood, MD;  Location: [facility] ENDOSCOPY;  Service: Gastroenterology;  Laterality: N/A;   ESOPHAGOGASTRODUODENOSCOPY (EGD) WITH PROPOFOL N/A 04/12/2021   Procedure: ESOPHAGOGASTRODUODENOSCOPY (EGD) WITH PROPOFOL;  Surgeon: Wyline Mood, MD;  Location: [facility] ENDOSCOPY;  Service: Gastroenterology;  Laterality: N/A;   FRACTURE SURGERY     GALLBLADDER SURGERY     GIVENS CAPSULE STUDY N/A 03/17/2017   Procedure: GIVENS CAPSULE STUDY;  Surgeon: Midge Minium, MD;  Location: [facility] ENDOSCOPY;  Service: Endoscopy;  Laterality: N/A;   GIVENS CAPSULE STUDY N/A 12/01/2017   Procedure: GIVENS CAPSULE STUDY, 12 HOUR;  Surgeon: Wyline Mood, MD;  Location: [facility] ENDOSCOPY;  Service: Gastroenterology;  Laterality: N/A;   SMALL INTESTINE SURGERY     THROAT SURGERY     nodule  on vocal chord   TIBIA FRACTURE SURGERY Right    WRIST SURGERY Right    arthritis   Family History  Problem Relation Age of Onset   Diabetes Mother    Heart disease Mother    Alzheimer's disease Mother    Other Father 18       "old age"   Diabetes Brother    Breast cancer Neg Hx    Social History   Socioeconomic History   Marital status: Married    Spouse name: Charles   Number of children: 1   Years of education: Not on file   Highest education level: Associate degree: academic program  Occupational History   Occupation: retired  Tobacco Use   Smoking status: Former    Current packs/day: 0.00    Average packs/day: 1.5 packs/day for 27.0 years (40.5 ttl pk-yrs)    Types: Cigarettes    Start date: 44    Quit date: 1999    Years since quitting: 25.9   Smokeless tobacco: Never  Vaping Use   Vaping status: Never Used  Substance and Sexual Activity   Alcohol use: Yes    Alcohol/week: 20.0 standard drinks of alcohol    Types: 20 Shots  of liquor per week    Comment: 3-4  cocktails 6 days per week   Drug use: No   Sexual activity: Not Currently  Other Topics Concern   Not on file  Social History Narrative   Retired, but substitute teaches occasionally. Married with 1 child and 1 step-child   Social Determinants of Health   Financial Resource Strain: Low Risk  (11/05/2023)   Overall Financial Resource Strain (CARDIA)    Difficulty of Paying Living Expenses: Not hard at all  Food Insecurity: No Food Insecurity (11/05/2023)   Hunger Vital Sign    Worried About Running Out of Food in the Last Year: Never true    Ran Out of Food in the Last Year: Never true  Transportation Needs: No Transportation Needs (11/05/2023)   PRAPARE - Administrator, Civil Service (Medical): No    Lack of Transportation (Non-Medical): No  Physical Activity: Insufficiently Active (11/05/2023)   Exercise Vital Sign    Days of Exercise per Week: 1 day    Minutes of Exercise per  Session: 20 min  Stress: No Stress Concern Present (11/05/2023)   [employer] of Occupational Health - Occupational Stress Questionnaire    Feeling of Stress : Only a little  Social Connections: Moderately Isolated (11/05/2023)   Social Connection and Isolation Panel [NHANES]    Frequency of Communication with Friends and Family: More than three times a week    Frequency of Social Gatherings with Friends and Family: Once a week    Attends Religious Services: Never    Database administrator or Organizations: No    Attends Engineer, structural: Never    Marital Status: Married    Tobacco Counseling Counseling given: Not Answered   Clinical Intake:  Pre-visit preparation completed: Yes  Pain : 0-10 Pain Score: 3  Pain Type: Chronic pain Pain Location: Shoulder Pain Orientation: Right Pain Descriptors / Indicators: Aching     BMI - recorded: 34.46 Nutritional Status: BMI > 30  Obese Nutritional Risks: None Diabetes: No  How often do you need to have someone help you when you read instructions, pamphlets, or other written materials from your doctor or pharmacy?: 1 - Never  Interpreter Needed?: No  Information entered by :: Tora Kindred, CMA   Activities of Daily Living    11/05/2023    5:17 PM  In your present state of health, do you have any difficulty performing the following activities:  Hearing? 0  Vision? 1  Comment will be having cataract surgery in March 2025  Difficulty concentrating or making decisions? 0  Walking or climbing stairs? 0  Dressing or bathing? 0  Doing errands, shopping? 0  Preparing Food and eating ? N  Using the Toilet? N  In the past six months, have you accidently leaked urine? Y  Comment sometimes wears pad or depends  Do you have problems with loss of bowel control? N  Managing your Medications? N  Managing your Finances? N  Housekeeping or managing your Housekeeping? N    Patient Care Team: Duanne Limerick, MD  as PCP - General (Family Medicine)  Indicate any recent Medical Services you may have received from other than [facility] providers in the past year (date may be approximate).     Assessment:   This is a routine wellness examination for Brenda.  Hearing/Vision screen Hearing Screening - Comments:: Denies hearing loss Vision Screening - Comments:: Gets eye exams   Goals Addressed  This Visit's Progress     Weight (lb) < 200 lb (90.7 kg) (pt-stated)        Depression Screen    11/06/2023    1:44 PM 08/31/2023    2:05 PM 03/08/2023    9:24 AM 02/03/2023   10:06 AM 10/26/2022    3:18 PM 09/07/2022    9:34 AM 03/07/2022   10:18 AM  PHQ 2/9 Scores  PHQ - 2 Score 0 0 0 0 0 0 0  PHQ- 9 Score 0 0 0 0 0 0 0    Fall Risk    11/05/2023    5:17 PM 08/31/2023    2:05 PM 03/08/2023    9:24 AM 02/03/2023   10:06 AM 10/26/2022    3:21 PM  Fall Risk   Falls in the past year? 1 0 0 0 1  Number falls in past yr: 0 0 0 0 1  Injury with Fall? 0 0 0 0 0  Risk for fall due to : History of fall(s)  No Fall Risks No Fall Risks History of fall(s)  Follow up Falls prevention discussed;Education provided;Falls evaluation completed  Falls evaluation completed Falls evaluation completed Falls evaluation completed    MEDICARE RISK AT HOME: Medicare Risk at Home Any stairs in or around the home?: Yes If so, are there any without handrails?: No Home free of loose throw rugs in walkways, pet beds, electrical cords, etc?: Yes Adequate lighting in your home to reduce risk of falls?: Yes Life alert?: No Use of a cane, walker or w/c?: No Grab bars in the bathroom?: No Shower chair or bench in shower?: No Elevated toilet seat or a handicapped toilet?: Yes  TIMED UP AND GO:  Was the test performed?  No    Cognitive Function:        11/06/2023    1:47 PM 10/26/2022    3:22 PM 09/30/2020    3:46 PM  6CIT Screen  What Year? 0 points 0 points 0 points  What month? 0 points 0 points  0 points  What time? 0 points 0 points 0 points  Count back from 20 0 points 0 points 0 points  Months in reverse 0 points 0 points 0 points  Repeat phrase 0 points 2 points 0 points  Total Score 0 points 2 points 0 points    Immunizations Immunization History  Administered Date(s) Administered   Fluad Quad(high Dose 65+) 11/19/2019, 10/05/2020, 09/07/2021, 09/19/2022   Fluad Trivalent(High Dose 65+) 11/03/2023   Influenza, High Dose Seasonal PF 10/14/2018   Influenza-Unspecified 09/12/2015, 10/14/2018   Moderna Sars-Covid-2 Vaccination 01/23/2020, 02/20/2020   PNEUMOCOCCAL CONJUGATE-20 03/23/2021   Pneumococcal Conjugate-13 12/30/2015, 10/14/2018   Pneumococcal Polysaccharide-23 12/30/2015, 12/30/2015   Tdap 12/21/2013   Zoster Recombinant(Shingrix) 10/14/2018    TDAP status: Due, Education has been provided regarding the importance of this vaccine. Advised may receive this vaccine at local pharmacy or Health Dept. Aware to provide a copy of the vaccination record if obtained from local pharmacy or Health Dept. Verbalized acceptance and understanding.  Flu Vaccine status: Up to date  Pneumococcal vaccine status: Up to date  Covid-19 vaccine status: Declined, Education has been provided regarding the importance of this vaccine but patient still declined. Advised may receive this vaccine at local pharmacy or Health Dept.or vaccine clinic. Aware to provide a copy of the vaccination record if obtained from local pharmacy or Health Dept. Verbalized acceptance and understanding.  Qualifies for Shingles Vaccine? Yes   Zostavax completed No  Shingrix Completed?: No.    Education has been provided regarding the importance of this vaccine. Patient has been advised to call insurance company to determine out of pocket expense if they have not yet received this vaccine. Advised may also receive vaccine at local pharmacy or Health Dept. Verbalized acceptance and understanding.  Screening  Tests Health Maintenance  Topic Date Due   Hepatitis C Screening  Never done   Zoster Vaccines- Shingrix (2 of 2) 12/09/2018   DTaP/Tdap/Td (2 - Td or Tdap) 12/22/2023   Colonoscopy  04/12/2024   Medicare Annual Wellness (AWV)  11/05/2024   MAMMOGRAM  06/27/2025   Pneumonia Vaccine 110+ Years old  Completed   INFLUENZA VACCINE  Completed   DEXA SCAN  Completed   HPV VACCINES  Aged Out   COVID-19 Vaccine  Discontinued    Health Maintenance  Health Maintenance Due  Topic Date Due   Hepatitis C Screening  Never done   Zoster Vaccines- Shingrix (2 of 2) 12/09/2018    Colorectal cancer screening: Type of screening: Colonoscopy. Completed 04/12/21. Repeat every 3 years  Mammogram status: Completed 06/28/23. Repeat every year  Bone Density status: Ordered 11/06/23. Pt provided with contact info and advised to call to schedule appt.  Lung Cancer Screening: (Low Dose CT Chest recommended if Age 54-80 years, 20 pack-year currently smoking OR have quit w/in 15years.) does not qualify.   Lung Cancer Screening Referral: n/a  Additional Screening:  Hepatitis C Screening: does qualify; Patient declined  Vision Screening: Recommended annual ophthalmology exams for early detection of glaucoma and other disorders of the eye.  Dental Screening: Recommended annual dental exams for proper oral hygiene   Community Resource Referral / Chronic Care Management: CRR required this visit?  No   CCM required this visit?  No     Plan:     I have personally reviewed and noted the following in the patient's chart:   Medical and social history Use of alcohol, tobacco or illicit drugs  Current medications and supplements including opioid prescriptions. Patient is not currently taking opioid prescriptions. Functional ability and status Nutritional status Physical activity Advanced directives List of other physicians Hospitalizations, surgeries, and ER visits in previous 12  months Vitals Screenings to include cognitive, depression, and falls Referrals and appointments  In addition, I have reviewed and discussed with patient certain preventive protocols, quality metrics, and best practice recommendations. A written personalized care plan for preventive services as well as general preventive health recommendations were provided to patient.     Tora Kindred, CMA   11/06/2023   After Visit Summary: (MyChart) Due to this being a telephonic visit, the after visit summary with patients personalized plan was offered to patient via MyChart   Nurse Notes:  Will need Tdap 12/2023 Needs 2nd shingles vaccine Declined covid and hep c screening Placed order for DEXA scan

## 2023-11-06 NOTE — Patient Instructions (Addendum)
Brenda Campbell , Thank you for taking time to come for your Medicare Wellness Visit. I appreciate your ongoing commitment to your health goals. Please review the following plan we discussed and let me know if I can assist you in the future.   Referrals/Orders/Follow-Ups/Clinician Recommendations:  You will be due for a tetanus shot January 2025. You need the 2nd shingles vaccine. I have placed an order for a bone density test. Call [facility] @ [phone] to schedule this at your earliest convenience. You may have this done at [facility].   This is a list of the screening recommended for you and due dates:  Health Maintenance  Topic Date Due   Hepatitis C Screening  Never done   Zoster (Shingles) Vaccine (2 of 2) 12/09/2018   DTaP/Tdap/Td vaccine (2 - Td or Tdap) 12/22/2023   Colon Cancer Screening  04/12/2024   Mammogram  06/27/2024   Medicare Annual Wellness Visit  11/05/2024   Pneumonia Vaccine  Completed   Flu Shot  Completed   DEXA scan (bone density measurement)  Completed   HPV Vaccine  Aged Out   COVID-19 Vaccine  Discontinued    Advanced directives: (ACP Link)Information on Advanced Care Planning can be found at [facility] [place] Advance Health Care Directives Advance Health Care Directives ([email or web address]   Once you have completed the forms, please bring a copy of your health care power of attorney and living will to the office to be added to your chart at your convenience.   Next Medicare Annual Wellness Visit scheduled for next year: Yes, 11/21/24 @ 9:20am  Fall Prevention in the Home, Adult Falls can cause injuries and affect people of all ages. There are many simple things that you can do to make your home safe and to help prevent falls. If you need it, ask for help making these changes. What actions can I take to prevent falls? General information Use good lighting in all rooms. Make sure to: Replace any light bulbs that burn  out. Turn on lights if it is dark and use night-lights. Keep items that you use often in easy-to-reach places. Lower the shelves around your home if needed. Move furniture so that there are clear paths around it. Do not keep throw rugs or other things on the floor that can make you trip. If any of your floors are uneven, fix them. Add color or contrast paint or tape to clearly mark and help you see: Grab bars or handrails. First and last steps of staircases. Where the edge of each step is. If you use a ladder or stepladder: Make sure that it is fully opened. Do not climb a closed ladder. Make sure the sides of the ladder are locked in place. Have someone hold the ladder while you use it. Know where your pets are as you move through your home. What can I do in the bathroom?     Keep the floor dry. Clean up any water that is on the floor right away. Remove soap buildup in the bathtub or shower. Buildup makes bathtubs and showers slippery. Use non-skid mats or decals on the floor of the bathtub or shower. Attach bath mats securely with double-sided, non-slip rug tape. If you need to sit down while you are in the shower, use a non-slip stool. Install grab bars by the toilet and in the bathtub and shower. Do not use towel bars as grab bars. What can I do in the bedroom? Make sure  that you have a light by your bed that is easy to reach. Do not use any sheets or blankets on your bed that hang to the floor. Have a firm bench or chair with side arms that you can use for support when you get dressed. What can I do in the kitchen? Clean up any spills right away. If you need to reach something above you, use a sturdy step stool that has a grab bar. Keep electrical cables out of the way. Do not use floor polish or wax that makes floors slippery. What can I do with my stairs? Do not leave anything on the stairs. Make sure that you have a light switch at the top and the bottom of the stairs.  Have them installed if you do not have them. Make sure that there are handrails on both sides of the stairs. Fix handrails that are broken or loose. Make sure that handrails are as long as the staircases. Install non-slip stair treads on all stairs in your home if they do not have carpet. Avoid having throw rugs at the top or bottom of stairs, or secure the rugs with carpet tape to prevent them from moving. Choose a carpet design that does not hide the edge of steps on the stairs. Make sure that carpet is firmly attached to the stairs. Fix any carpet that is loose or worn. What can I do on the outside of my home? Use bright outdoor lighting. Repair the edges of walkways and driveways and fix any cracks. Clear paths of anything that can make you trip, such as tools or rocks. Add color or contrast paint or tape to clearly mark and help you see high doorway thresholds. Trim any bushes or trees on the main path into your home. Check that handrails are securely fastened and in good repair. Both sides of all steps should have handrails. Install guardrails along the edges of any raised decks or porches. Have leaves, snow, and ice cleared regularly. Use sand, salt, or ice melt on walkways during winter months if you live where there is ice and snow. In the garage, clean up any spills right away, including grease or oil spills. What other actions can I take? Review your medicines with your health care provider. Some medicines can make you confused or feel dizzy. This can increase your chance of falling. Wear closed-toe shoes that fit well and support your feet. Wear shoes that have rubber soles and low heels. Use a cane, walker, scooter, or crutches that help you move around if needed. Talk with your provider about other ways that you can decrease your risk of falls. This may include seeing a physical therapist to learn to do exercises to improve movement and strength. Where to find more  information Centers for Disease Control and Prevention, STEADI: TonerPromos.no [employer] on Aging: BaseRingTones.pl National Institute on Aging: BaseRingTones.pl Contact a health care provider if: You are afraid of falling at home. You feel weak, drowsy, or dizzy at home. You fall at home. Get help right away if you: Lose consciousness or have trouble moving after a fall. Have a fall that causes a head injury. These symptoms may be an emergency. Get help right away. Call 911. Do not wait to see if the symptoms will go away. Do not drive yourself to the hospital. This information is not intended to replace advice given to you by your health care provider. Make sure you discuss any questions you have with your health  care provider. Document Revised: 08/01/2022 Document Reviewed: 08/01/2022 Elsevier Patient Education  2024 [employer].

## 2023-11-26 ENCOUNTER — Other Ambulatory Visit: Payer: Self-pay

## 2023-11-26 NOTE — Progress Notes (Signed)
This patient is appearing on a report for being at risk of failing the adherence measure for cholesterol (statin) medications this calendar year.   Medication: rosuvastatin 5 mg PO daily Last fill date: 08/30/21 for 90 day supply  Patient reported that she has several bottles of rosuvastatin left because she is not taking the medication. She has concerns about statins - based on things she has read. Discussed that Dr. Yetta Barre would like for her to take a statin because her LDL-C is 148 mg/dL which is above goal of less than 70 mg/dL to prevent heart attacks and strokes. Patient denied AE with medication in the past. She is willing to restart taking the medication and she would like to have a refill on file at her pharmacy.   Will coordinate with PCP to send refill of rosuvastatin 5 mg PO daily to the pharmacy.   No further action needed at this time.   Nils Pyle, PharmD PGY1 Pharmacy Resident

## 2023-11-27 ENCOUNTER — Other Ambulatory Visit: Payer: Self-pay | Admitting: Family Medicine

## 2023-11-27 ENCOUNTER — Telehealth: Payer: Self-pay | Admitting: *Deleted

## 2023-11-27 ENCOUNTER — Telehealth: Payer: PPO | Admitting: Family Medicine

## 2023-11-27 ENCOUNTER — Encounter: Payer: Self-pay | Admitting: Family Medicine

## 2023-11-27 DIAGNOSIS — R300 Dysuria: Secondary | ICD-10-CM

## 2023-11-27 MED ORDER — ROSUVASTATIN CALCIUM 5 MG PO TABS: 5.0000 mg | ORAL_TABLET | Freq: Every day | ORAL | 1 refills | Status: DC

## 2023-11-27 NOTE — Progress Notes (Unsigned)
11/29/2023 3:23 PM   Brenda Campbell 03-11-1951 [id number]  Referring provider: Duanne Limerick, MD [address] [place],  [place] [address]  Urological history: 1.  rUTI's -Contributing factors of age, vaginal atrophy, -RUS (09/2021) -  Echogenic foci in both kidneys without posterior shadowing. These may represent nonobstructing stones versus renal sinus fat -KUB (09/2021) - 3 punctate calculi measuring up to 2 mm overlying the upper pole the right kidney. A possible 2 mm calculus overlies the medial interpolar region of the left kidney. No urolithiasis. -cysto (12/2021) - Mild erythematous at trigone with whitish plaques consistent with squamous metasplasia -CTU (08/2022) - Bilateral nephrolithiasis.  -Documented urine urine cultures over the last year  October 16, 2023 MDRO Klebsiella pneumoniae  July 19, 2023 E.coli  April 10, 2023 E.coli   -Vaginal estrogen cream  2. Urge incontinence -Contributing factors of age, vaginal atrophy, former smoker, hypertension, anxiety, depression, obesity, arthritis and benzodiazepines  3.  High risk hematuria -Former smoker -CTU (08/2022) - no worrisome findings -cysto (12/2021) - NED  No chief complaint on file.  HPI: Brenda Campbell is a 72 y.o. female who presents today for UTI.   Previous records reviewed.   UA ***    PMH: Past Medical History:  Diagnosis Date   Anxiety    Arthritis    Blood transfusion without reported diagnosis    Cancer (HCC)    small bowel ca   Depression    GERD (gastroesophageal reflux disease)    Hyperlipidemia    Hypertension    Thyroid disease     Surgical History: Past Surgical History:  Procedure Laterality Date   CESAREAN SECTION     x 1   CHOLECYSTECTOMY     COLONOSCOPY  10/12/2014   cleared for 3 years   COLONOSCOPY N/A 03/16/2017   Procedure: COLONOSCOPY;  Surgeon: Midge Minium, MD;  Location: [facility] ENDOSCOPY;  Service: Endoscopy;  Laterality: N/A;    COLONOSCOPY WITH PROPOFOL N/A 12/01/2017   Procedure: COLONOSCOPY WITH PROPOFOL;  Surgeon: Wyline Mood, MD;  Location: [facility] ENDOSCOPY;  Service: Gastroenterology;  Laterality: N/A;   COLONOSCOPY WITH PROPOFOL N/A 04/12/2021   Procedure: COLONOSCOPY WITH PROPOFOL;  Surgeon: Wyline Mood, MD;  Location: [facility] ENDOSCOPY;  Service: Gastroenterology;  Laterality: N/A;   ESOPHAGOGASTRODUODENOSCOPY (EGD) WITH PROPOFOL N/A 03/15/2017   Procedure: ESOPHAGOGASTRODUODENOSCOPY (EGD) WITH PROPOFOL;  Surgeon: Wyline Mood, MD;  Location: [facility] ENDOSCOPY;  Service: Endoscopy;  Laterality: N/A;   ESOPHAGOGASTRODUODENOSCOPY (EGD) WITH PROPOFOL N/A 03/30/2017   Procedure: ESOPHAGOGASTRODUODENOSCOPY (EGD) WITH PROPOFOL;  Surgeon: Wyline Mood, MD;  Location: [facility] ENDOSCOPY;  Service: Endoscopy;  Laterality: N/A;  Endoscopic Capsule Placement   ESOPHAGOGASTRODUODENOSCOPY (EGD) WITH PROPOFOL N/A 12/01/2017   Procedure: ESOPHAGOGASTRODUODENOSCOPY (EGD) WITH PROPOFOL;  Surgeon: Wyline Mood, MD;  Location: [facility] ENDOSCOPY;  Service: Gastroenterology;  Laterality: N/A;   ESOPHAGOGASTRODUODENOSCOPY (EGD) WITH PROPOFOL N/A 04/12/2021   Procedure: ESOPHAGOGASTRODUODENOSCOPY (EGD) WITH PROPOFOL;  Surgeon: Wyline Mood, MD;  Location: [facility] ENDOSCOPY;  Service: Gastroenterology;  Laterality: N/A;   FRACTURE SURGERY     GALLBLADDER SURGERY     GIVENS CAPSULE STUDY N/A 03/17/2017   Procedure: GIVENS CAPSULE STUDY;  Surgeon: Midge Minium, MD;  Location: [facility] ENDOSCOPY;  Service: Endoscopy;  Laterality: N/A;   GIVENS CAPSULE STUDY N/A 12/01/2017   Procedure: GIVENS CAPSULE STUDY, 12 HOUR;  Surgeon: Wyline Mood, MD;  Location: [facility] ENDOSCOPY;  Service: Gastroenterology;  Laterality: N/A;   SMALL INTESTINE SURGERY     THROAT SURGERY  nodule on vocal chord   TIBIA FRACTURE SURGERY Right    WRIST SURGERY Right    arthritis    Home Medications:  Allergies as of 11/29/2023   No Known Allergies      Medication List         Accurate as of November 27, 2023  3:23 PM. If you have any questions, ask your nurse or doctor.          ALPRAZolam 0.25 MG tablet Commonly known as: XANAX Take 1 tablet (0.25 mg total) by mouth as needed.   clotrimazole-betamethasone cream Commonly known as: LOTRISONE Apply externally BID for 2 wks   ipratropium 0.06 % nasal spray Commonly known as: ATROVENT Place 2 sprays into both nostrils 4 (four) times daily.   levothyroxine 50 MCG tablet Commonly known as: SYNTHROID Take 1 tablet (50 mcg total) by mouth daily.   magnesium gluconate 500 MG tablet Commonly known as: MAGONATE Take 500 mg by mouth daily.   methenamine 1 g tablet Commonly known as: Hiprex Take 1 tablet (1 g total) by mouth 2 (two) times daily with a meal.   nystatin cream Commonly known as: MYCOSTATIN Apply 1 Application topically 2 (two) times daily.   omeprazole 20 MG capsule Commonly known as: PRILOSEC TAKE 1 CAPSULE BY MOUTH TWICE A DAY   Premarin vaginal cream Generic drug: conjugated estrogens Estrogen Cream Instruction Discard applicator Apply pea sized amount to tip of finger to urethra before bed. Wash hands well after application. Use Monday, Wednesday and Friday   Probiotic Daily Caps Take 1 capsule by mouth daily.   promethazine-dextromethorphan 6.25-15 MG/5ML syrup Commonly known as: PROMETHAZINE-DM Take 5 mLs by mouth 4 (four) times daily as needed.   rosuvastatin 5 MG tablet Commonly known as: CRESTOR Take 1 tablet (5 mg total) by mouth daily.   sertraline 50 MG tablet Commonly known as: ZOLOFT Take 1 tablet (50 mg total) by mouth daily.   UNABLE TO FIND Lion's Mane 1000mg  1 tablet daily   VITAMIN B-12 SL Place 2,000 mcg under the tongue daily.        Allergies: No Known Allergies  Family History: Family History  Problem Relation Age of Onset   Diabetes Mother    Heart disease Mother    Alzheimer's disease Mother    Other Father 2       "old age"   Diabetes  Brother    Breast cancer Neg Hx     Social History:  reports that she quit smoking about 25 years ago. Her smoking use included cigarettes. She started smoking about 52 years ago. She has a 40.5 pack-year smoking history. She has never used smokeless tobacco. She reports current alcohol use of about 20.0 standard drinks of alcohol per week. She reports that she does not use drugs.  ROS: Pertinent ROS in HPI  Physical Exam: There were no vitals taken for this visit.  Constitutional:  Well nourished. Alert and oriented, No acute distress. HEENT: [place] AT, moist mucus membranes.  Trachea midline, no masses. Cardiovascular: No clubbing, cyanosis, or edema. Respiratory: Normal respiratory effort, no increased work of breathing. GU: No CVA tenderness.  No bladder fullness or masses.  Recession of labia minora, dry, pale vulvar vaginal mucosa and loss of mucosal ridges and folds.  Normal urethral meatus, no lesions, no prolapse, no discharge.   No urethral masses, tenderness and/or tenderness. No bladder fullness, tenderness or masses. *** vagina mucosa, *** estrogen effect, no discharge, no lesions, *** pelvic support, ***  cystocele and *** rectocele noted.  No cervical motion tenderness.  Uterus is freely mobile and non-fixed.  No adnexal/parametria masses or tenderness noted.  Anus and perineum are without rashes or lesions.   ***  Neurologic: Grossly intact, no focal deficits, moving all 4 extremities. Psychiatric: Normal mood and affect.    Laboratory Data: Component     Latest Ref Rng 08/31/2023  Glucose     70 - 99 mg/dL 161 (H)   BUN     8 - 27 mg/dL 12   Creatinine     0.96 - 1.00 mg/dL 0.45   BUN/Creatinine Ratio     12 - 28  20   Sodium     134 - 144 mmol/L 140   Potassium     3.5 - 5.2 mmol/L 3.8   Chloride     96 - 106 mmol/L 102   CO2     20 - 29 mmol/L 23   Calcium     8.7 - 10.3 mg/dL 8.8   Phosphorus     3.0 - 4.3 mg/dL 3.2   Albumin     3.8 - 4.8 g/dL 4.0   eGFR      >40 JW/JXB/1.47 95     Legend: (H) High  Lab Results  Component Value Date   TSH 2.400 08/31/2023   Urinalysis See EPIC and HPI I have reviewed the labs.   Pertinent Imaging: N/A  Assessment & Plan:  ***  1. Suspected UTI -UA grossly infected  -Urine culture pending -Started empirically on ***, will adjust if necessary once urine culture and sensitivity results are available    2. rUTI's -Encouraged continue vaginal estrogen cream use 3 nights weekly  No follow-ups on file.  These notes generated with voice recognition software. I apologize for typographical errors.  Cloretta Ned  [facility] [address]  [address] [place], [place] [address] [phone]

## 2023-11-27 NOTE — Telephone Encounter (Signed)
Pt calling stating that she has the " usual " UTI symptoms and would like an abx called in. I explained to pt that she would need to be seen and give [place] a urine sample. Per pt we have her chart and can see what she needs, pt also states she's taking hipex and estradiol. Pt is have urinary frequency and burning. I offered pt appt tomorrow and she declined due to prior obligations. Pt was upset that we are not treating her, again I offered an appt and she declined.

## 2023-11-27 NOTE — Telephone Encounter (Signed)
Pt scheduled with Carollee Herter on 11/29/23

## 2023-11-27 NOTE — Progress Notes (Signed)
Because you have a complicated UTI history, we need to collect samples of urine prior to starting medication your condition warrants further evaluation and I recommend that you be seen in a face to face visit at your PCP, urologist and or local urgent care for this.   NOTE: There will be NO CHARGE for this eVisit   If you are having a true medical emergency please call 911.

## 2023-11-29 ENCOUNTER — Encounter: Payer: Self-pay | Admitting: Urology

## 2023-11-29 ENCOUNTER — Ambulatory Visit: Payer: PPO | Admitting: Urology

## 2023-11-29 VITALS — BP 126/85 | HR 105 | Temp 98.5°F | Ht 67.0 in | Wt 220.0 lb

## 2023-11-29 DIAGNOSIS — R82998 Other abnormal findings in urine: Secondary | ICD-10-CM

## 2023-11-29 DIAGNOSIS — R3129 Other microscopic hematuria: Secondary | ICD-10-CM | POA: Diagnosis not present

## 2023-11-29 DIAGNOSIS — R3989 Other symptoms and signs involving the genitourinary system: Secondary | ICD-10-CM

## 2023-11-29 DIAGNOSIS — N390 Urinary tract infection, site not specified: Secondary | ICD-10-CM

## 2023-11-29 DIAGNOSIS — Z87440 Personal history of urinary (tract) infections: Secondary | ICD-10-CM

## 2023-11-29 LAB — MICROSCOPIC EXAMINATION: Epithelial Cells (non renal): >10 /[HPF] — AB (ref 0–10)

## 2023-11-29 LAB — URINALYSIS, COMPLETE
Bilirubin, UA: NEGATIVE
Glucose, UA: NEGATIVE
Nitrite, UA: POSITIVE — AB
Protein,UA: NEGATIVE
Specific Gravity, UA: >1.03 — ABNORMAL HIGH (ref 1.005–1.030)
Urobilinogen, Ur: 0.2 mg/dL (ref 0.2–1.0)
pH, UA: 5.5 (ref 5.0–7.5)

## 2023-11-29 MED ORDER — CHLORHEXIDINE GLUCONATE 4 % EX SOLN: Freq: Every day | CUTANEOUS | 3 refills | Status: DC | PRN

## 2023-11-29 MED ORDER — FLUCONAZOLE 150 MG PO TABS
150.0000 mg | ORAL_TABLET | Freq: Once | ORAL | 0 refills | Status: AC
Start: 2023-11-29 — End: 2023-11-29

## 2023-11-29 MED ORDER — AMOXICILLIN-POT CLAVULANATE 875-125 MG PO TABS
1.0000 | ORAL_TABLET | Freq: Two times a day (BID) | ORAL | 0 refills | Status: DC
Start: 2023-11-29 — End: 2024-01-05

## 2023-12-03 LAB — CULTURE, URINE COMPREHENSIVE

## 2024-01-01 NOTE — Progress Notes (Unsigned)
01/02/2024 2:41 PM   [facility] [birth year] [id number]  Referring provider: Duanne Limerick, MD [address] [place],  [place] [address]  Urological history: 1.  rUTI's -Contributing factors of age, vaginal atrophy, -RUS (09/2021) -  Echogenic foci in both kidneys without posterior shadowing. These may represent nonobstructing stones versus renal sinus fat -KUB (09/2021) - 3 punctate calculi measuring up to 2 mm overlying the upper pole the right kidney. A possible 2 mm calculus overlies the medial interpolar region of the left kidney. No urolithiasis. -cysto (12/2021) - Mild erythematous at trigone with whitish plaques consistent with squamous metasplasia -CTU (08/2022) - Bilateral nephrolithiasis.  -Documented urine urine cultures over the last year  November 29, 2023 E. coli  October 16, 2023 MDRO Klebsiella pneumoniae  July 19, 2023 E.coli  April 10, 2023 E.coli   -Vaginal estrogen cream three nights weekly and Hiprex 1 gram daily (cannot tolerate two times daily)  2. Urge incontinence -Contributing factors of age, vaginal atrophy, former smoker, hypertension, anxiety, depression, obesity, arthritis and benzodiazepines  3.  High risk hematuria -Former smoker -CTU (08/2022) - no worrisome findings -cysto (12/2021) - NED  Chief Complaint  Patient presents with   Follow-up   HPI: Brenda Campbell is a 73 y.o. female who presents today for one month follow-up for recheck on micro heme after being treated for UTI.  Previous records reviewed.   At her visit on 11/29/2023, she has been having dysuria, frequency, urgency that started over the weekend.   Patient denies any modifying or aggravating factors.  Patient denies any recent UTI's, gross hematuria, dysuria or suprapubic/flank pain.  Patient denies any fevers, chills, nausea or vomiting.  UA nitrate positive, 11-30 WBCs, 3-10 RBCs, greater than 10 epithelial cells, mucus threads are present  and many bacteria.  Urine culture positive for E.coli.   She has been applying the vaginal estrogen cream 3 nights weekly.  She has been taking the Hiprex 1 g daily, she cannot tolerate taking it twice daily.  We also started using Hibiclens to clean the perineum after having bowel movements or voiding.  She is not having symptoms of urinary tract infection today.  Patient denies any modifying or aggravating factors.  Patient denies any recent UTI's, gross hematuria, dysuria or suprapubic/flank pain.  Patient denies any fevers, chills, nausea or vomiting.    UA yellow cloudy, specific gravity greater than 1.030, trace ketone, trace heme, pH 5.5, protein trace, nitrate positive, 2+ leukocyte, greater than 30 WBCs, 3-10 RBCs, greater than 10 epithelial cells and many bacteria.  CATH UA yellow clear, trace ketone, specific area 1.025, pH 5.5, nitrate positive (likely due to Hiprex), 1+ leukocyte, greater than 30 WBCs, mucus threads present, many bacteria.     PMH: Past Medical History:  Diagnosis Date   Anxiety    Arthritis    Blood transfusion without reported diagnosis    Cancer (HCC)    small bowel ca   Depression    GERD (gastroesophageal reflux disease)    Hyperlipidemia    Hypertension    Thyroid disease     Surgical History: Past Surgical History:  Procedure Laterality Date   CESAREAN SECTION     x 1   CHOLECYSTECTOMY     COLONOSCOPY  10/12/2014   cleared for 3 years   COLONOSCOPY N/A 03/16/2017   Procedure: COLONOSCOPY;  Surgeon: Midge Minium, MD;  Location: [facility] ENDOSCOPY;  Service: Endoscopy;  Laterality: N/A;   COLONOSCOPY WITH PROPOFOL N/A  12/01/2017   Procedure: COLONOSCOPY WITH PROPOFOL;  Surgeon: Wyline Mood, MD;  Location: [facility] ENDOSCOPY;  Service: Gastroenterology;  Laterality: N/A;   COLONOSCOPY WITH PROPOFOL N/A 04/12/2021   Procedure: COLONOSCOPY WITH PROPOFOL;  Surgeon: Wyline Mood, MD;  Location: [facility] ENDOSCOPY;  Service: Gastroenterology;  Laterality: N/A;    ESOPHAGOGASTRODUODENOSCOPY (EGD) WITH PROPOFOL N/A 03/15/2017   Procedure: ESOPHAGOGASTRODUODENOSCOPY (EGD) WITH PROPOFOL;  Surgeon: Wyline Mood, MD;  Location: [facility] ENDOSCOPY;  Service: Endoscopy;  Laterality: N/A;   ESOPHAGOGASTRODUODENOSCOPY (EGD) WITH PROPOFOL N/A 03/30/2017   Procedure: ESOPHAGOGASTRODUODENOSCOPY (EGD) WITH PROPOFOL;  Surgeon: Wyline Mood, MD;  Location: [facility] ENDOSCOPY;  Service: Endoscopy;  Laterality: N/A;  Endoscopic Capsule Placement   ESOPHAGOGASTRODUODENOSCOPY (EGD) WITH PROPOFOL N/A 12/01/2017   Procedure: ESOPHAGOGASTRODUODENOSCOPY (EGD) WITH PROPOFOL;  Surgeon: Wyline Mood, MD;  Location: [facility] ENDOSCOPY;  Service: Gastroenterology;  Laterality: N/A;   ESOPHAGOGASTRODUODENOSCOPY (EGD) WITH PROPOFOL N/A 04/12/2021   Procedure: ESOPHAGOGASTRODUODENOSCOPY (EGD) WITH PROPOFOL;  Surgeon: Wyline Mood, MD;  Location: [facility] ENDOSCOPY;  Service: Gastroenterology;  Laterality: N/A;   FRACTURE SURGERY     GALLBLADDER SURGERY     GIVENS CAPSULE STUDY N/A 03/17/2017   Procedure: GIVENS CAPSULE STUDY;  Surgeon: Midge Minium, MD;  Location: [facility] ENDOSCOPY;  Service: Endoscopy;  Laterality: N/A;   GIVENS CAPSULE STUDY N/A 12/01/2017   Procedure: GIVENS CAPSULE STUDY, 12 HOUR;  Surgeon: Wyline Mood, MD;  Location: [facility] ENDOSCOPY;  Service: Gastroenterology;  Laterality: N/A;   SMALL INTESTINE SURGERY     THROAT SURGERY     nodule on vocal chord   TIBIA FRACTURE SURGERY Right    WRIST SURGERY Right    arthritis    Home Medications:  Allergies as of 01/02/2024   No Known Allergies      Medication List        Accurate as of January 02, 2024  2:41 PM. If you have any questions, ask your nurse or doctor.          ALPRAZolam 0.25 MG tablet Commonly known as: XANAX Take 1 tablet (0.25 mg total) by mouth as needed.   amoxicillin-clavulanate 875-125 MG tablet Commonly known as: AUGMENTIN Take 1 tablet by mouth every 12 (twelve) hours.   chlorhexidine 4 % external  liquid Commonly known as: Hibiclens Apply topically daily as needed.   clotrimazole-betamethasone cream Commonly known as: LOTRISONE Apply externally BID for 2 wks   ipratropium 0.06 % nasal spray Commonly known as: ATROVENT Place 2 sprays into both nostrils 4 (four) times daily.   levothyroxine 50 MCG tablet Commonly known as: SYNTHROID Take 1 tablet (50 mcg total) by mouth daily.   magnesium gluconate 500 MG tablet Commonly known as: MAGONATE Take 500 mg by mouth daily.   methenamine 1 g tablet Commonly known as: Hiprex Take 1 tablet (1 g total) by mouth 2 (two) times daily with a meal.   nystatin cream Commonly known as: MYCOSTATIN Apply 1 Application topically 2 (two) times daily.   omeprazole 20 MG capsule Commonly known as: PRILOSEC TAKE 1 CAPSULE BY MOUTH TWICE A DAY   Premarin vaginal cream Generic drug: conjugated estrogens Estrogen Cream Instruction Discard applicator Apply pea sized amount to tip of finger to urethra before bed. Wash hands well after application. Use Monday, Wednesday and Friday   Probiotic Daily Caps Take 1 capsule by mouth daily.   promethazine-dextromethorphan 6.25-15 MG/5ML syrup Commonly known as: PROMETHAZINE-DM Take 5 mLs by mouth 4 (four) times daily as needed.   rosuvastatin 5 MG tablet Commonly known as: CRESTOR Take  1 tablet (5 mg total) by mouth daily.   sertraline 50 MG tablet Commonly known as: ZOLOFT Take 1 tablet (50 mg total) by mouth daily.   UNABLE TO FIND Lion's Mane 1000mg  1 tablet daily   VITAMIN B-12 SL Place 2,000 mcg under the tongue daily.        Allergies: No Known Allergies  Family History: Family History  Problem Relation Age of Onset   Diabetes Mother    Heart disease Mother    Alzheimer's disease Mother    Other Father 45       "old age"   Diabetes Brother    Breast cancer Neg Hx     Social History:  reports that she quit smoking about 26 years ago. Her smoking use included  cigarettes. She started smoking about 53 years ago. She has a 40.5 pack-year smoking history. She has never used smokeless tobacco. She reports current alcohol use of about 20.0 standard drinks of alcohol per week. She reports that she does not use drugs.  ROS: Pertinent ROS in HPI  Physical Exam: BP 114/87   Ht 5\' 7"  (1.702 m)   Wt 220 lb (99.8 kg)   BMI 34.46 kg/m   Constitutional:  Well nourished. Alert and oriented, No acute distress. HEENT: [place] AT, moist mucus membranes.  Trachea midline, no masses. Cardiovascular: No clubbing, cyanosis, or edema. Respiratory: Normal respiratory effort, no increased work of breathing. Neurologic: Grossly intact, no focal deficits, moving all 4 extremities. Psychiatric: Normal mood and affect.    Laboratory Data: Urinalysis See EPIC and HPI I have reviewed the labs.   Pertinent Imaging: N/A  Assessment & Plan:    1. rUTI's -Encouraged continue vaginal estrogen cream use 3 nights weekly -Continue Hibiclens to clean the perineal area when she finishes voiding or defecating, waiting for pharmacy to get a supply -Continue Hiprex 1 g daily, will try to increase it to 1-1/2 g daily  2. Microscopic hematuria -Patient is voiding sample was contaminated, so she was catheterized for specimen -CATH UA w/o micro heme  3. Vaginal atrophy -Continue vaginal estrogen cream 3 nights weekly  No follow-ups on file.  These notes generated with voice recognition software. I apologize for typographical errors.  Cloretta Ned  [facility] [address]  [address] [place], [place] [address] [phone]

## 2024-01-02 ENCOUNTER — Encounter: Payer: Self-pay | Admitting: Urology

## 2024-01-02 ENCOUNTER — Ambulatory Visit: Payer: PPO | Admitting: Urology

## 2024-01-02 VITALS — BP 114/87 | Ht 67.0 in | Wt 220.0 lb

## 2024-01-02 DIAGNOSIS — Z87440 Personal history of urinary (tract) infections: Secondary | ICD-10-CM | POA: Diagnosis not present

## 2024-01-02 DIAGNOSIS — N390 Urinary tract infection, site not specified: Secondary | ICD-10-CM | POA: Diagnosis not present

## 2024-01-02 DIAGNOSIS — R3129 Other microscopic hematuria: Secondary | ICD-10-CM

## 2024-01-02 DIAGNOSIS — N952 Postmenopausal atrophic vaginitis: Secondary | ICD-10-CM | POA: Diagnosis not present

## 2024-01-02 LAB — URINALYSIS, COMPLETE
Bilirubin, UA: NEGATIVE
Bilirubin, UA: NEGATIVE
Glucose, UA: NEGATIVE
Glucose, UA: NEGATIVE
Nitrite, UA: POSITIVE — AB
Nitrite, UA: POSITIVE — AB
Protein,UA: NEGATIVE
RBC, UA: NEGATIVE
Specific Gravity, UA: >1.03 — ABNORMAL HIGH (ref 1.005–1.030)
Specific Gravity, UA: 1.025 (ref 1.005–1.030)
Urobilinogen, Ur: 0.2 mg/dL (ref 0.2–1.0)
Urobilinogen, Ur: 0.2 mg/dL (ref 0.2–1.0)
pH, UA: 5.5 (ref 5.0–7.5)
pH, UA: 5.5 (ref 5.0–7.5)

## 2024-01-02 LAB — MICROSCOPIC EXAMINATION
Epithelial Cells (non renal): >10 /[HPF] — AB (ref 0–10)
WBC, UA: >30 /[HPF] — AB (ref 0–5)
WBC, UA: >30 /[HPF] — AB (ref 0–5)

## 2024-01-02 NOTE — Progress Notes (Signed)
In and Out Catheterization  Patient is present today for a I & O catheterization due to UTI . Patient was cleaned and prepped in a sterile fashion with betadine . A 14FR cath was inserted no complications were noted , 50ml of urine return was noted, urine was dark yellow in color. A clean urine sample was collected for UA . Bladder was drained  And catheter was removed with out difficulty.    Performed by: Vela Prose

## 2024-01-05 ENCOUNTER — Telehealth: Payer: Self-pay

## 2024-01-05 DIAGNOSIS — N390 Urinary tract infection, site not specified: Secondary | ICD-10-CM

## 2024-01-05 LAB — CULTURE, URINE COMPREHENSIVE

## 2024-01-05 MED ORDER — AMOXICILLIN-POT CLAVULANATE 875-125 MG PO TABS: 1.0000 | ORAL_TABLET | Freq: Two times a day (BID) | ORAL | 0 refills | Status: DC

## 2024-01-05 NOTE — Telephone Encounter (Signed)
Called pt informed her that RX was sent in. Pt voiced understanding.

## 2024-01-05 NOTE — Telephone Encounter (Signed)
Pt LM on triage line stating that she was sent a mychart message advising an antibiotic would be sent in, she has contacted the pharmacy and the RX is not there. Pharmacy in chart is correct. Please advise.

## 2024-01-16 DIAGNOSIS — Z872 Personal history of diseases of the skin and subcutaneous tissue: Secondary | ICD-10-CM | POA: Diagnosis not present

## 2024-01-16 DIAGNOSIS — L853 Xerosis cutis: Secondary | ICD-10-CM | POA: Diagnosis not present

## 2024-01-16 DIAGNOSIS — Q8501 Neurofibromatosis, type 1: Secondary | ICD-10-CM | POA: Diagnosis not present

## 2024-01-16 DIAGNOSIS — L578 Other skin changes due to chronic exposure to nonionizing radiation: Secondary | ICD-10-CM | POA: Diagnosis not present

## 2024-01-16 DIAGNOSIS — Z86018 Personal history of other benign neoplasm: Secondary | ICD-10-CM | POA: Diagnosis not present

## 2024-01-19 ENCOUNTER — Other Ambulatory Visit: Payer: Self-pay | Admitting: Obstetrics and Gynecology

## 2024-01-19 DIAGNOSIS — L292 Pruritus vulvae: Secondary | ICD-10-CM

## 2024-01-20 ENCOUNTER — Other Ambulatory Visit: Payer: Self-pay | Admitting: Obstetrics and Gynecology

## 2024-01-20 DIAGNOSIS — L292 Pruritus vulvae: Secondary | ICD-10-CM

## 2024-01-22 MED ORDER — CLOTRIMAZOLE-BETAMETHASONE 1-0.05 % EX CREA
TOPICAL_CREAM | CUTANEOUS | 0 refills | Status: DC
Start: 2024-01-22 — End: 2024-02-27

## 2024-01-26 ENCOUNTER — Telehealth: Payer: PPO | Admitting: Nurse Practitioner

## 2024-01-26 DIAGNOSIS — R3989 Other symptoms and signs involving the genitourinary system: Secondary | ICD-10-CM

## 2024-01-26 NOTE — Progress Notes (Signed)
  Because of your history of recurrent UTIs I feel your condition warrants further evaluation and I recommend that you be seen in a face-to-face visit.   NOTE: There will be NO CHARGE for this E-Visit   If you are having a true medical emergency, please call 911.     For an urgent face to face visit, [place] has multiple urgent care centers for your convenience.  Click the link below for the full list of locations and hours, walk-in wait times, appointment scheduling options and driving directions:  Urgent Care - [place], [place], [place], [place], [place], [place]  [place]     Your MyChart E-visit questionnaire answers were reviewed by a board certified advanced clinical practitioner to complete your personal care plan based on your specific symptoms.    Thank you for using e-Visits.

## 2024-01-29 ENCOUNTER — Ambulatory Visit: Payer: PPO | Admitting: Urology

## 2024-01-29 ENCOUNTER — Other Ambulatory Visit
Admission: RE | Admit: 2024-01-29 | Discharge: 2024-01-29 | Disposition: A | Discharge status: Home / Self Care | Payer: PPO | Attending: Urology | Admitting: Urology

## 2024-01-29 ENCOUNTER — Encounter: Payer: Self-pay | Admitting: Urology

## 2024-01-29 ENCOUNTER — Other Ambulatory Visit: Payer: Self-pay | Admitting: Urology

## 2024-01-29 VITALS — BP 124/80 | HR 78 | Ht 67.0 in | Wt 220.0 lb

## 2024-01-29 DIAGNOSIS — N390 Urinary tract infection, site not specified: Secondary | ICD-10-CM | POA: Diagnosis not present

## 2024-01-29 DIAGNOSIS — Z87440 Personal history of urinary (tract) infections: Secondary | ICD-10-CM

## 2024-01-29 DIAGNOSIS — N952 Postmenopausal atrophic vaginitis: Secondary | ICD-10-CM | POA: Diagnosis not present

## 2024-01-29 LAB — URINALYSIS, COMPLETE (UACMP) WITH MICROSCOPIC
Bilirubin Urine: NEGATIVE
Glucose, UA: NEGATIVE mg/dL
Ketones, ur: NEGATIVE mg/dL
Nitrite: NEGATIVE
Protein, ur: NEGATIVE mg/dL
Specific Gravity, Urine: 1.02 (ref 1.005–1.030)
WBC, UA: >50 WBC/hpf (ref 0–5)
pH: 5.5 (ref 5.0–8.0)

## 2024-01-29 MED ORDER — CIPROFLOXACIN HCL 250 MG PO TABS
250.0000 mg | ORAL_TABLET | Freq: Two times a day (BID) | ORAL | 0 refills | Status: DC
Start: 2024-01-29 — End: 2024-03-14

## 2024-01-29 NOTE — Progress Notes (Signed)
01/29/2024 2:22 PM   [facility] 06-Feb-1951 [id number]  Referring provider: Duanne Limerick, MD [address] [place],  [place] [address]  Urological history: 1.  rUTI's -Contributing factors of age, vaginal atrophy, -RUS (09/2021) -  Echogenic foci in both kidneys without posterior shadowing. These may represent nonobstructing stones versus renal sinus fat -KUB (09/2021) - 3 punctate calculi measuring up to 2 mm overlying the upper pole the right kidney. A possible 2 mm calculus overlies the medial interpolar region of the left kidney. No urolithiasis. -cysto (12/2021) - Mild erythematous at trigone with whitish plaques consistent with squamous metasplasia -CTU (08/2022) - Bilateral nephrolithiasis.  -Documented urine urine cultures over the last year  January 04, 2024, E. coli  November 29, 2023 E. coli  October 16, 2023 MDRO Klebsiella pneumoniae  July 19, 2023 E.coli  April 10, 2023 E.coli   -Vaginal estrogen cream three nights weekly and Hiprex 1 gram daily (cannot tolerate two times daily)  2. Urge incontinence -Contributing factors of age, vaginal atrophy, former smoker, hypertension, anxiety, depression, obesity, arthritis and benzodiazepines  3.  High risk hematuria -Former smoker -CTU (08/2022) - no worrisome findings -cysto (12/2021) - NED  Chief Complaint  Patient presents with   Recurrent UTI   HPI: Brenda Campbell is a 73 y.o. female who presents today for frequent urination and burning.  Previous records reviewed.   Over the weekend, she started to experience dysuria, urinary frequency, voiding small amounts and urgency.  Patient denies any modifying or aggravating factors.  Patient denies any recent UTI's, gross hematuria, dysuria or suprapubic/flank pain.  Patient denies any fevers, chills, nausea or vomiting.    UA yellow hazy, specific gravity 1.020, pH 5.5, trace heme, large leukocytes, 6-10 squames, > 50 WBC's, 0-5  RBC's, many bacteria and WBC clumps present.    PMH: Past Medical History:  Diagnosis Date   Anxiety    Arthritis    Blood transfusion without reported diagnosis    Cancer (HCC)    small bowel ca   Depression    GERD (gastroesophageal reflux disease)    Hyperlipidemia    Hypertension    Thyroid disease     Surgical History: Past Surgical History:  Procedure Laterality Date   CESAREAN SECTION     x 1   CHOLECYSTECTOMY     COLONOSCOPY  10/12/2014   cleared for 3 years   COLONOSCOPY N/A 03/16/2017   Procedure: COLONOSCOPY;  Surgeon: Midge Minium, MD;  Location: [facility] ENDOSCOPY;  Service: Endoscopy;  Laterality: N/A;   COLONOSCOPY WITH PROPOFOL N/A 12/01/2017   Procedure: COLONOSCOPY WITH PROPOFOL;  Surgeon: Wyline Mood, MD;  Location: [facility] ENDOSCOPY;  Service: Gastroenterology;  Laterality: N/A;   COLONOSCOPY WITH PROPOFOL N/A 04/12/2021   Procedure: COLONOSCOPY WITH PROPOFOL;  Surgeon: Wyline Mood, MD;  Location: [facility] ENDOSCOPY;  Service: Gastroenterology;  Laterality: N/A;   ESOPHAGOGASTRODUODENOSCOPY (EGD) WITH PROPOFOL N/A 03/15/2017   Procedure: ESOPHAGOGASTRODUODENOSCOPY (EGD) WITH PROPOFOL;  Surgeon: Wyline Mood, MD;  Location: [facility] ENDOSCOPY;  Service: Endoscopy;  Laterality: N/A;   ESOPHAGOGASTRODUODENOSCOPY (EGD) WITH PROPOFOL N/A 03/30/2017   Procedure: ESOPHAGOGASTRODUODENOSCOPY (EGD) WITH PROPOFOL;  Surgeon: Wyline Mood, MD;  Location: [facility] ENDOSCOPY;  Service: Endoscopy;  Laterality: N/A;  Endoscopic Capsule Placement   ESOPHAGOGASTRODUODENOSCOPY (EGD) WITH PROPOFOL N/A 12/01/2017   Procedure: ESOPHAGOGASTRODUODENOSCOPY (EGD) WITH PROPOFOL;  Surgeon: Wyline Mood, MD;  Location: [facility] ENDOSCOPY;  Service: Gastroenterology;  Laterality: N/A;   ESOPHAGOGASTRODUODENOSCOPY (EGD) WITH PROPOFOL N/A 04/12/2021   Procedure: ESOPHAGOGASTRODUODENOSCOPY (  EGD) WITH PROPOFOL;  Surgeon: Wyline Mood, MD;  Location: [facility] ENDOSCOPY;  Service: Gastroenterology;  Laterality: N/A;   FRACTURE  SURGERY     GALLBLADDER SURGERY     GIVENS CAPSULE STUDY N/A 03/17/2017   Procedure: GIVENS CAPSULE STUDY;  Surgeon: Midge Minium, MD;  Location: [facility] ENDOSCOPY;  Service: Endoscopy;  Laterality: N/A;   GIVENS CAPSULE STUDY N/A 12/01/2017   Procedure: GIVENS CAPSULE STUDY, 12 HOUR;  Surgeon: Wyline Mood, MD;  Location: [facility] ENDOSCOPY;  Service: Gastroenterology;  Laterality: N/A;   SMALL INTESTINE SURGERY     THROAT SURGERY     nodule on vocal chord   TIBIA FRACTURE SURGERY Right    WRIST SURGERY Right    arthritis    Home Medications:  Allergies as of 01/29/2024   No Known Allergies      Medication List        Accurate as of January 29, 2024  2:22 PM. If you have any questions, ask your nurse or doctor.          STOP taking these medications    amoxicillin-clavulanate 875-125 MG tablet Commonly known as: AUGMENTIN Stopped by: Carollee Herter Kaiyu Mirabal   nystatin cream Commonly known as: MYCOSTATIN Stopped by: Lilianne Delair       TAKE these medications    ALPRAZolam 0.25 MG tablet Commonly known as: XANAX Take 1 tablet (0.25 mg total) by mouth as needed.   chlorhexidine 4 % external liquid Commonly known as: Hibiclens Apply topically daily as needed.   ciprofloxacin 250 MG tablet Commonly known as: Cipro Take 1 tablet (250 mg total) by mouth 2 (two) times daily. Started by: Michiel Cowboy   clotrimazole-betamethasone cream Commonly known as: LOTRISONE Apply externally BID for 2 wks   ipratropium 0.06 % nasal spray Commonly known as: ATROVENT Place 2 sprays into both nostrils 4 (four) times daily.   levothyroxine 50 MCG tablet Commonly known as: SYNTHROID Take 1 tablet (50 mcg total) by mouth daily.   magnesium gluconate 500 MG tablet Commonly known as: MAGONATE Take 500 mg by mouth daily.   methenamine 1 g tablet Commonly known as: Hiprex Take 1 tablet (1 g total) by mouth 2 (two) times daily with a meal.   omeprazole 20 MG capsule Commonly known  as: PRILOSEC TAKE 1 CAPSULE BY MOUTH TWICE A DAY   Premarin vaginal cream Generic drug: conjugated estrogens Estrogen Cream Instruction Discard applicator Apply pea sized amount to tip of finger to urethra before bed. Wash hands well after application. Use Monday, Wednesday and Friday   Probiotic Daily Caps Take 1 capsule by mouth daily.   promethazine-dextromethorphan 6.25-15 MG/5ML syrup Commonly known as: PROMETHAZINE-DM Take 5 mLs by mouth 4 (four) times daily as needed.   rosuvastatin 5 MG tablet Commonly known as: CRESTOR Take 1 tablet (5 mg total) by mouth daily.   sertraline 50 MG tablet Commonly known as: ZOLOFT Take 1 tablet (50 mg total) by mouth daily.   UNABLE TO FIND Lion's Mane 1000mg  1 tablet daily   VITAMIN B-12 SL Place 2,000 mcg under the tongue daily.        Allergies: No Known Allergies  Family History: Family History  Problem Relation Age of Onset   Diabetes Mother    Heart disease Mother    Alzheimer's disease Mother    Other Father 26       "old age"   Diabetes Brother    Breast cancer Neg Hx     Social History:  reports that  she quit smoking about 26 years ago. Her smoking use included cigarettes. She started smoking about 53 years ago. She has a 40.5 pack-year smoking history. She has never used smokeless tobacco. She reports current alcohol use of about 20.0 standard drinks of alcohol per week. She reports that she does not use drugs.  ROS: Pertinent ROS in HPI  Physical Exam: BP 124/80 (BP Location: Left Arm, Patient Position: Sitting, Cuff Size: Large)   Pulse 78   Ht 5\' 7"  (1.702 m)   Wt 220 lb (99.8 kg)   SpO2 98%   BMI 34.46 kg/m   Constitutional:  Well nourished. Alert and oriented, No acute distress. HEENT: [place] AT, moist mucus membranes.  Trachea midline, no masses. Cardiovascular: No clubbing, cyanosis, or edema. Respiratory: Normal respiratory effort, no increased work of breathing. Neurologic: Grossly intact, no focal  deficits, moving all 4 extremities. Psychiatric: Normal mood and affect.     Laboratory Data: Urinalysis See EPIC and HPI I have reviewed the labs.   Pertinent Imaging: N/A  Assessment & Plan:    1. rUTI's -continue vaginal estrogen cream use 3 nights weekly -Continue Hibiclens to clean the perineal area when she finishes voiding or defecating, waiting for pharmacy to get a supply -Continue Hiprex 1 g daily, will try to increase it to 1-1/2 g daily -Today's UA appears grossly infected -Urine sent for culture -She started empirically on ciprofloxacin 250 mg twice daily for 7 days, will adjust if necessary once urine culture results are available -Once she has completed 7 days of culture appropriate antibiotics, we will place her on a once daily prophylactic antibiotic for 3 months  2. Vaginal atrophy -Continue vaginal estrogen cream 3 nights weekly  Return for pending urine cutlure results .  These notes generated with voice recognition software. I apologize for typographical errors.  Cloretta Ned  [facility] [address]  [address] [place], [place] [address] [phone]

## 2024-01-31 ENCOUNTER — Other Ambulatory Visit: Payer: Self-pay | Admitting: Urology

## 2024-01-31 DIAGNOSIS — N390 Urinary tract infection, site not specified: Secondary | ICD-10-CM

## 2024-01-31 LAB — URINE CULTURE: Culture: >=100000 — AB

## 2024-01-31 MED ORDER — NITROFURANTOIN MONOHYD MACRO 100 MG PO CAPS
100.0000 mg | ORAL_CAPSULE | Freq: Every day | ORAL | 0 refills | Status: DC
Start: 2024-01-31 — End: 2024-07-15

## 2024-02-17 ENCOUNTER — Telehealth: Admitting: Physician Assistant

## 2024-02-17 ENCOUNTER — Other Ambulatory Visit: Payer: Self-pay | Admitting: Obstetrics and Gynecology

## 2024-02-17 DIAGNOSIS — N760 Acute vaginitis: Secondary | ICD-10-CM | POA: Diagnosis not present

## 2024-02-17 DIAGNOSIS — L292 Pruritus vulvae: Secondary | ICD-10-CM

## 2024-02-17 MED ORDER — FLUCONAZOLE 150 MG PO TABS: 150.0000 mg | ORAL_TABLET | Freq: Once | ORAL | 0 refills | Status: AC

## 2024-02-17 NOTE — Progress Notes (Signed)
 I have spent 5 minutes in review of e-visit questionnaire, review and updating patient chart, medical decision making and response to patient.   Laure Kidney, PA-C

## 2024-02-17 NOTE — Progress Notes (Signed)

## 2024-02-23 DIAGNOSIS — H43813 Vitreous degeneration, bilateral: Secondary | ICD-10-CM | POA: Diagnosis not present

## 2024-02-23 DIAGNOSIS — H2513 Age-related nuclear cataract, bilateral: Secondary | ICD-10-CM | POA: Diagnosis not present

## 2024-02-23 DIAGNOSIS — H25013 Cortical age-related cataract, bilateral: Secondary | ICD-10-CM | POA: Diagnosis not present

## 2024-02-23 DIAGNOSIS — H04123 Dry eye syndrome of bilateral lacrimal glands: Secondary | ICD-10-CM | POA: Diagnosis not present

## 2024-02-23 DIAGNOSIS — H538 Other visual disturbances: Secondary | ICD-10-CM | POA: Diagnosis not present

## 2024-02-27 ENCOUNTER — Encounter: Payer: Self-pay | Admitting: Family Medicine

## 2024-02-27 ENCOUNTER — Ambulatory Visit (INDEPENDENT_AMBULATORY_CARE_PROVIDER_SITE_OTHER): Admitting: Family Medicine

## 2024-02-27 VITALS — BP 122/78 | Ht 67.0 in | Wt 229.6 lb

## 2024-02-27 DIAGNOSIS — F330 Major depressive disorder, recurrent, mild: Secondary | ICD-10-CM | POA: Diagnosis not present

## 2024-02-27 DIAGNOSIS — E782 Mixed hyperlipidemia: Secondary | ICD-10-CM | POA: Diagnosis not present

## 2024-02-27 DIAGNOSIS — E039 Hypothyroidism, unspecified: Secondary | ICD-10-CM | POA: Diagnosis not present

## 2024-02-27 DIAGNOSIS — R051 Acute cough: Secondary | ICD-10-CM

## 2024-02-27 DIAGNOSIS — L292 Pruritus vulvae: Secondary | ICD-10-CM

## 2024-02-27 DIAGNOSIS — R739 Hyperglycemia, unspecified: Secondary | ICD-10-CM | POA: Diagnosis not present

## 2024-02-27 MED ORDER — ROSUVASTATIN CALCIUM 5 MG PO TABS: 5.0000 mg | ORAL_TABLET | Freq: Every day | ORAL | 1 refills | Status: AC

## 2024-02-27 MED ORDER — BENZONATATE 100 MG PO CAPS
100.0000 mg | ORAL_CAPSULE | Freq: Three times a day (TID) | ORAL | 0 refills | Status: DC | PRN
Start: 2024-02-27 — End: 2024-08-08

## 2024-02-27 MED ORDER — ROSUVASTATIN CALCIUM 5 MG PO TABS: 5.0000 mg | ORAL_TABLET | Freq: Every day | ORAL | 1 refills | Status: DC

## 2024-02-27 MED ORDER — CLOTRIMAZOLE-BETAMETHASONE 1-0.05 % EX CREA: TOPICAL_CREAM | CUTANEOUS | 0 refills | Status: DC

## 2024-02-27 MED ORDER — LEVOTHYROXINE SODIUM 50 MCG PO TABS: 50.0000 ug | ORAL_TABLET | Freq: Every day | ORAL | 1 refills | Status: AC

## 2024-02-27 MED ORDER — SERTRALINE HCL 50 MG PO TABS: 50.0000 mg | ORAL_TABLET | Freq: Every day | ORAL | 1 refills | Status: DC

## 2024-02-27 NOTE — Progress Notes (Signed)
 Date:  02/27/2024   Name:  Brenda Campbell   DOB:  11/05/1951   MRN:  [id number]   Chief Complaint: Medical Management of Chronic Issues (6 month follow-up) and Diabetes (Pt has cataracts and was asked if she was diabetic and would like to be checked to be sure as she has family history. )  Thyroid Problem Presents for follow-up visit. Symptoms include anxiety and hair loss. Patient reports no cold intolerance, constipation, depressed mood, diarrhea, dry skin, fatigue, heat intolerance, hoarse voice, menstrual problem, palpitations, visual change, weight gain or weight loss. The symptoms have been stable. Her past medical history is significant for hyperlipidemia. There is no history of diabetes.  Hyperlipidemia This is a chronic problem. The current episode started more than 1 year ago. The problem is controlled. Recent lipid tests were reviewed and are normal. Exacerbating diseases include hypothyroidism. She has no history of chronic renal disease, diabetes, obesity or nephrotic syndrome. Pertinent negatives include no chest pain, focal sensory loss, focal weakness, leg pain, myalgias or shortness of breath. Current antihyperlipidemic treatment includes statins. The current treatment provides moderate improvement of lipids. There are no compliance problems.   Depression        This is a chronic problem.  The current episode started more than 1 year ago.   The problem has been gradually improving since onset.  Associated symptoms include no decreased concentration, no fatigue, no helplessness, no hopelessness, does not have insomnia, not irritable, no restlessness, no decreased interest, no appetite change, no body aches, no myalgias, no headaches, no indigestion, not sad and no suicidal ideas.  Past treatments include SSRIs - Selective serotonin reuptake inhibitors.  Past medical history includes hypothyroidism and thyroid problem.     Lab Results  Component Value Date   NA 140  08/31/2023   K 3.8 08/31/2023   CO2 23 08/31/2023   GLUCOSE 103 (H) 08/31/2023   BUN 12 08/31/2023   CREATININE 0.61 08/31/2023   CALCIUM 8.8 08/31/2023   EGFR 95 08/31/2023   GFRNONAA 90 10/05/2020   Lab Results  Component Value Date   CHOL 239 (H) 03/08/2023   HDL 65 03/08/2023   LDLCALC 148 (H) 03/08/2023   TRIG 149 03/08/2023   CHOLHDL 4.3 10/22/2018   Lab Results  Component Value Date   TSH 2.400 08/31/2023   No results found for: "HGBA1C" Lab Results  Component Value Date   WBC 5.7 02/14/2020   HGB 14.5 09/07/2021   HCT 42.4 02/14/2020   MCV 98 (H) 02/14/2020   PLT 248 02/14/2020   Lab Results  Component Value Date   ALT 14 03/08/2023   AST 17 03/08/2023   ALKPHOS 88 03/08/2023   BILITOT 0.5 03/08/2023   No results found for: "25OHVITD2", "25OHVITD3", "VD25OH"   Review of Systems  Constitutional: Negative.  Negative for appetite change, chills, fatigue, fever, unexpected weight change, weight gain and weight loss.  HENT:  Negative for congestion, ear discharge, ear pain, hoarse voice, rhinorrhea, sinus pressure, sneezing and sore throat.   Respiratory:  Negative for cough, shortness of breath, wheezing and stridor.   Cardiovascular:  Negative for chest pain and palpitations.  Gastrointestinal:  Negative for abdominal pain, blood in stool, constipation, diarrhea and nausea.  Endocrine: Negative for cold intolerance and heat intolerance.  Genitourinary:  Negative for dysuria, flank pain, frequency, hematuria, menstrual problem, urgency and vaginal discharge.  Musculoskeletal:  Negative for arthralgias, back pain and myalgias.  Skin:  Negative for rash.  Neurological:  Negative for dizziness, focal weakness, weakness and headaches.  Hematological:  Negative for adenopathy. Does not bruise/bleed easily.  Psychiatric/Behavioral:  Positive for depression. Negative for decreased concentration, dysphoric mood and suicidal ideas. The patient is nervous/anxious. The  patient does not have insomnia.     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   Gastroesophageal reflux disease 12/22/2014   Hypothyroidism 12/22/2014   Obesity 12/22/2014   Tachycardia 12/22/2014   Malaise 12/22/2014    No Known Allergies  Past Surgical History:  Procedure Laterality Date   CESAREAN SECTION     x 1   CHOLECYSTECTOMY     COLONOSCOPY  10/12/2014   cleared for 3 years   COLONOSCOPY N/A 03/16/2017   Procedure: COLONOSCOPY;  Surgeon: Midge Minium, MD;  Location: [facility] ENDOSCOPY;  Service: Endoscopy;  Laterality: N/A;   COLONOSCOPY WITH PROPOFOL N/A 12/01/2017   Procedure: COLONOSCOPY WITH PROPOFOL;  Surgeon: Wyline Mood, MD;  Location: [facility] ENDOSCOPY;  Service: Gastroenterology;  Laterality: N/A;   COLONOSCOPY WITH PROPOFOL N/A 04/12/2021   Procedure: COLONOSCOPY WITH PROPOFOL;  Surgeon: Wyline Mood, MD;  Location: [facility] ENDOSCOPY;  Service: Gastroenterology;  Laterality: N/A;   ESOPHAGOGASTRODUODENOSCOPY (EGD) WITH PROPOFOL N/A 03/15/2017   Procedure: ESOPHAGOGASTRODUODENOSCOPY (EGD) WITH PROPOFOL;  Surgeon: Wyline Mood, MD;  Location: [facility] ENDOSCOPY;  Service: Endoscopy;  Laterality: N/A;   ESOPHAGOGASTRODUODENOSCOPY (EGD) WITH PROPOFOL N/A 03/30/2017   Procedure: ESOPHAGOGASTRODUODENOSCOPY (EGD) WITH PROPOFOL;  Surgeon: Wyline Mood, MD;  Location: [facility] ENDOSCOPY;  Service: Endoscopy;  Laterality: N/A;  Endoscopic Capsule Placement   ESOPHAGOGASTRODUODENOSCOPY (EGD) WITH PROPOFOL N/A 12/01/2017    Procedure: ESOPHAGOGASTRODUODENOSCOPY (EGD) WITH PROPOFOL;  Surgeon: Wyline Mood, MD;  Location: [facility] ENDOSCOPY;  Service: Gastroenterology;  Laterality: N/A;   ESOPHAGOGASTRODUODENOSCOPY (EGD) WITH PROPOFOL N/A 04/12/2021   Procedure: ESOPHAGOGASTRODUODENOSCOPY (EGD) WITH PROPOFOL;  Surgeon: Wyline Mood, MD;  Location: [facility] ENDOSCOPY;  Service: Gastroenterology;  Laterality: N/A;   FRACTURE SURGERY     GALLBLADDER SURGERY     GIVENS CAPSULE STUDY N/A 03/17/2017   Procedure: GIVENS CAPSULE STUDY;  Surgeon: Midge Minium, MD;  Location: [facility] ENDOSCOPY;  Service: Endoscopy;  Laterality: N/A;   GIVENS CAPSULE STUDY N/A 12/01/2017   Procedure: GIVENS CAPSULE STUDY, 12 HOUR;  Surgeon: Wyline Mood, MD;  Location: [facility] ENDOSCOPY;  Service: Gastroenterology;  Laterality: N/A;   SMALL INTESTINE SURGERY     THROAT SURGERY     nodule on vocal chord   TIBIA FRACTURE SURGERY Right    WRIST SURGERY Right    arthritis    Social History   Tobacco Use   Smoking status: Former    Current packs/day: 0.00    Average packs/day: 1.5 packs/day for 27.0 years (40.5 ttl pk-yrs)    Types: Cigarettes    Start date: 48    Quit date: 1999    Years since quitting: 26.2   Smokeless tobacco: Never  Vaping Use   Vaping status: Never Used  Substance Use Topics   Alcohol use: Yes    Alcohol/week: 20.0 standard drinks of alcohol    Types: 20 Shots of liquor per  week    Comment: 3-4  cocktails 6 days per week   Drug use: No     Medication list has been reviewed and updated.  Current Meds  Medication Sig   benzonatate (TESSALON) 100 MG capsule Take 100 mg by mouth 3 (three) times daily as needed for cough.   chlorhexidine (HIBICLENS) 4 % external liquid Apply topically daily as needed.   ciprofloxacin (CIPRO) 250 MG tablet Take 1 tablet (250 mg total) by mouth 2 (two) times daily.   clotrimazole-betamethasone (LOTRISONE) cream Apply externally BID for 2 wks   conjugated estrogens (PREMARIN) vaginal cream  Estrogen Cream Instruction Discard applicator Apply pea sized amount to tip of finger to urethra before bed. Wash hands well after application. Use Monday, Wednesday and Friday   Cyanocobalamin (VITAMIN B-12 SL) Place 2,000 mcg under the tongue daily.   ipratropium (ATROVENT) 0.06 % nasal spray Place 2 sprays into both nostrils 4 (four) times daily.   levothyroxine (SYNTHROID) 50 MCG tablet Take 1 tablet (50 mcg total) by mouth daily.   magnesium gluconate (MAGONATE) 500 MG tablet Take 500 mg by mouth daily.   methenamine (HIPREX) 1 g tablet Take 1 tablet (1 g total) by mouth 2 (two) times daily with a meal.   nitrofurantoin, macrocrystal-monohydrate, (MACROBID) 100 MG capsule Take 1 capsule (100 mg total) by mouth at bedtime.   omeprazole (PRILOSEC) 20 MG capsule TAKE 1 CAPSULE BY MOUTH TWICE A DAY   Probiotic Product (PROBIOTIC DAILY) CAPS Take 1 capsule by mouth daily.   promethazine-dextromethorphan (PROMETHAZINE-DM) 6.25-15 MG/5ML syrup Take 5 mLs by mouth 4 (four) times daily as needed.   rosuvastatin (CRESTOR) 5 MG tablet Take 1 tablet (5 mg total) by mouth daily.   sertraline (ZOLOFT) 50 MG tablet Take 1 tablet (50 mg total) by mouth daily.   UNABLE TO FIND Lion's Mane 1000mg  1 tablet daily   [DISCONTINUED] ALPRAZolam (XANAX) 0.25 MG tablet Take 1 tablet (0.25 mg total) by mouth as needed.       08/31/2023    2:05 PM 03/08/2023    9:24 AM 02/03/2023   10:06 AM 09/07/2022    9:34 AM  GAD 7 : Generalized Anxiety Score  Nervous, Anxious, on Edge 0 0 0 0  Control/stop worrying 0 0 0 0  Worry too much - different things 0 0 0 0  Trouble relaxing 0 0 0 0  Restless 0 0 0 0  Easily annoyed or irritable 0 0 0 0  Afraid - awful might happen 0 0 0 0  Total GAD 7 Score 0 0 0 0  Anxiety Difficulty Not difficult at all Not difficult at all Not difficult at all Not difficult at all       11/06/2023    1:44 PM 08/31/2023    2:05 PM 03/08/2023    9:24 AM  Depression screen PHQ 2/9  Decreased  Interest 0 0 0  Down, Depressed, Hopeless 0 0 0  PHQ - 2 Score 0 0 0  Altered sleeping 0 0 0  Tired, decreased energy 0 0 0  Change in appetite 0 0 0  Feeling bad or failure about yourself  0 0 0  Trouble concentrating 0 0 0  Moving slowly or fidgety/restless 0 0 0  Suicidal thoughts 0 0 0  PHQ-9 Score 0 0 0  Difficult doing work/chores Not difficult at all Not difficult at all Not difficult at all    BP Readings from Last 3 Encounters:  02/27/24 122/78  01/29/24 124/80  01/02/24 114/87    Physical Exam Vitals and nursing note reviewed.  Constitutional:      General: She is not irritable.She is not in acute distress.    Appearance: She is not diaphoretic.  HENT:     Head: Normocephalic and atraumatic.     Right Ear: External ear normal.     Left Ear: External ear normal.     Nose: Nose normal.  Eyes:     General:        Right eye: No discharge.        Left eye: No discharge.     Conjunctiva/sclera: Conjunctivae normal.     Pupils: Pupils are equal, round, and reactive to light.  Neck:     Thyroid: No thyromegaly.     Vascular: No JVD.  Cardiovascular:     Rate and Rhythm: Normal rate and regular rhythm.     Heart sounds: Normal heart sounds, S1 normal and S2 normal. No murmur heard.    No systolic murmur is present.     No diastolic murmur is present.     No friction rub. No gallop. No S3 or S4 sounds.  Pulmonary:     Effort: Pulmonary effort is normal.     Breath sounds: Normal breath sounds. No wheezing, rhonchi or rales.  Abdominal:     General: Bowel sounds are normal.     Palpations: Abdomen is soft. There is no mass.     Tenderness: There is no abdominal tenderness. There is no guarding.  Musculoskeletal:        General: Normal range of motion.     Cervical back: Normal range of motion and neck supple.  Lymphadenopathy:     Cervical: No cervical adenopathy.  Skin:    General: Skin is warm and dry.  Neurological:     Mental Status: She is alert.      Deep Tendon Reflexes: Reflexes are normal and symmetric.     Wt Readings from Last 3 Encounters:  02/27/24 229 lb 9.6 oz (104.1 kg)  01/29/24 220 lb (99.8 kg)  01/02/24 220 lb (99.8 kg)    BP 122/78 (BP Location: Right Arm, Patient Position: Sitting, Cuff Size: Large)   Ht 5\' 7"  (1.702 m)   Wt 229 lb 9.6 oz (104.1 kg)   BMI 35.96 kg/m   Assessment and Plan:  1. Hypothyroidism, unspecified type (Primary) Chronic.  Controlled.  Stable.  Asymptomatic.  Tolerating current dosing of the levothyroxine 50 mcg daily.  Will check TSH and if in appropriate range we will likely continue on this. - TSH - levothyroxine (SYNTHROID) 50 MCG tablet; Take 1 tablet (50 mcg total) by mouth daily.  Dispense: 90 tablet; Refill: 1  2. 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  Controlled.  Stable.  PHQ was 0 GAD score 0 continue sertraline 50 mg once a day. - sertraline (ZOLOFT) 50 MG tablet; Take 1 tablet (50 mg total) by mouth daily.  Dispense: 90 tablet; Refill: 1  3. Mixed hyperlipidemia Chronic.  Controlled.  Stable.  Asymptomatic.  Currently controlled on Crestor 5 mg once a day.  Will check CMP for hepatic concerns as well as lipid panel for current level of LDL control. - Comprehensive metabolic panel - Lipid Panel With LDL/HDL Ratio  4. Hyperglycemia Patient currently is scheduled for cataract surgery and that was brought up whether or not she has had a history of diabetes.  Patient has not been diabetic and in  the past with blood sugars no higher than 113 and the last fasting was 103.  We will check an A1c in case there is some missed hyperglycemic spikes and we will treat accordingly. - Hemoglobin A1c  5. Vulvar itching Chronic.  Controlled with Lotrisone prescribed by GYN for uncertain circumstance.  Patient's dog chewed up her tube and would like a refill and we will provide until she can see her gynecologist. - clotrimazole-betamethasone (LOTRISONE) cream; Apply  externally BID for 2 wks  Dispense: 15 g; Refill: 0  6. Acute cough New onset.  Episodic.  Stable.  Patient would like Tessalon Perles called in and we will accommodate. - benzonatate (TESSALON) 100 MG capsule; Take 1 capsule (100 mg total) by mouth 3 (three) times daily as needed for cough.  Dispense: 20 capsule; Refill: 0    Elizabeth Sauer, MD

## 2024-02-28 ENCOUNTER — Encounter: Payer: Self-pay | Admitting: Family Medicine

## 2024-02-28 LAB — COMPREHENSIVE METABOLIC PANEL
ALT: 21 IU/L (ref 0–32)
AST: 26 IU/L (ref 0–40)
Albumin: 4.2 g/dL (ref 3.8–4.8)
Alkaline Phosphatase: 95 IU/L (ref 44–121)
BUN/Creatinine Ratio: 17 (ref 12–28)
BUN: 10 mg/dL (ref 8–27)
Bilirubin Total: 0.4 mg/dL (ref 0.0–1.2)
CO2: 26 mmol/L (ref 20–29)
Calcium: 8.9 mg/dL (ref 8.7–10.3)
Chloride: 100 mmol/L (ref 96–106)
Creatinine, Ser: 0.58 mg/dL (ref 0.57–1.00)
Globulin, Total: 2.7 g/dL (ref 1.5–4.5)
Glucose: 95 mg/dL (ref 70–99)
Potassium: 4.3 mmol/L (ref 3.5–5.2)
Sodium: 141 mmol/L (ref 134–144)
Total Protein: 6.9 g/dL (ref 6.0–8.5)
eGFR: 95 mL/min/{1.73_m2} (ref >59)

## 2024-02-28 LAB — LIPID PANEL WITH LDL/HDL RATIO
Cholesterol, Total: 184 mg/dL (ref 100–199)
HDL: 66 mg/dL (ref >39)
LDL Chol Calc (NIH): 90 mg/dL (ref 0–99)
LDL/HDL Ratio: 1.4 ratio (ref 0.0–3.2)
Triglycerides: 163 mg/dL — ABNORMAL HIGH (ref 0–149)
VLDL Cholesterol Cal: 28 mg/dL (ref 5–40)

## 2024-02-28 LAB — HEMOGLOBIN A1C
Est. average glucose Bld gHb Est-mCnc: 111 mg/dL
Hgb A1c MFr Bld: 5.5 % (ref 4.8–5.6)

## 2024-02-28 LAB — TSH: TSH: 2.62 u[IU]/mL (ref 0.450–4.500)

## 2024-02-29 ENCOUNTER — Ambulatory Visit: Payer: Self-pay | Admitting: Family Medicine

## 2024-03-12 DIAGNOSIS — H25011 Cortical age-related cataract, right eye: Secondary | ICD-10-CM | POA: Diagnosis not present

## 2024-03-12 DIAGNOSIS — H2513 Age-related nuclear cataract, bilateral: Secondary | ICD-10-CM | POA: Diagnosis not present

## 2024-03-12 DIAGNOSIS — H2511 Age-related nuclear cataract, right eye: Secondary | ICD-10-CM | POA: Diagnosis not present

## 2024-03-14 ENCOUNTER — Encounter: Payer: Self-pay | Admitting: Ophthalmology

## 2024-03-21 NOTE — Discharge Instructions (Signed)

## 2024-03-25 ENCOUNTER — Ambulatory Visit (INDEPENDENT_AMBULATORY_CARE_PROVIDER_SITE_OTHER): Admitting: Family Medicine

## 2024-03-25 ENCOUNTER — Encounter: Payer: Self-pay | Admitting: Family Medicine

## 2024-03-25 ENCOUNTER — Telehealth: Payer: Self-pay

## 2024-03-25 ENCOUNTER — Ambulatory Visit
Admission: RE | Admit: 2024-03-25 | Discharge: 2024-03-25 | Disposition: A | Discharge status: Home / Self Care | Attending: Ophthalmology | Admitting: Ophthalmology

## 2024-03-25 ENCOUNTER — Ambulatory Visit: Payer: Self-pay | Admitting: Anesthesiology

## 2024-03-25 ENCOUNTER — Encounter: Payer: Self-pay | Admitting: Ophthalmology

## 2024-03-25 ENCOUNTER — Other Ambulatory Visit: Payer: Self-pay

## 2024-03-25 ENCOUNTER — Encounter
Admission: RE | Disposition: A | Discharge status: Home / Self Care | Payer: Self-pay | Source: Home / Self Care | Attending: Ophthalmology

## 2024-03-25 VITALS — BP 132/84 | HR 105 | Resp 16 | Ht 67.01 in | Wt 232.8 lb

## 2024-03-25 DIAGNOSIS — Z87891 Personal history of nicotine dependence: Secondary | ICD-10-CM | POA: Diagnosis not present

## 2024-03-25 DIAGNOSIS — I10 Essential (primary) hypertension: Secondary | ICD-10-CM | POA: Diagnosis not present

## 2024-03-25 DIAGNOSIS — F419 Anxiety disorder, unspecified: Secondary | ICD-10-CM | POA: Insufficient documentation

## 2024-03-25 DIAGNOSIS — H25011 Cortical age-related cataract, right eye: Secondary | ICD-10-CM | POA: Diagnosis not present

## 2024-03-25 DIAGNOSIS — I4891 Unspecified atrial fibrillation: Secondary | ICD-10-CM | POA: Diagnosis not present

## 2024-03-25 DIAGNOSIS — K219 Gastro-esophageal reflux disease without esophagitis: Secondary | ICD-10-CM | POA: Diagnosis not present

## 2024-03-25 DIAGNOSIS — R000 Tachycardia, unspecified: Secondary | ICD-10-CM

## 2024-03-25 DIAGNOSIS — I471 Supraventricular tachycardia, unspecified: Secondary | ICD-10-CM | POA: Diagnosis not present

## 2024-03-25 DIAGNOSIS — M1990 Unspecified osteoarthritis, unspecified site: Secondary | ICD-10-CM | POA: Diagnosis not present

## 2024-03-25 DIAGNOSIS — F32A Depression, unspecified: Secondary | ICD-10-CM | POA: Diagnosis not present

## 2024-03-25 DIAGNOSIS — H2511 Age-related nuclear cataract, right eye: Secondary | ICD-10-CM | POA: Diagnosis not present

## 2024-03-25 HISTORY — DX: Rheumatic tricuspid insufficiency: I07.1

## 2024-03-25 HISTORY — DX: Nonrheumatic mitral (valve) insufficiency: I34.0

## 2024-03-25 HISTORY — PX: CATARACT EXTRACTION W/PHACO: SHX586

## 2024-03-25 SURGERY — PHACOEMULSIFICATION, CATARACT, WITH IOL INSERTION
Anesthesia: Monitor Anesthesia Care | Site: Eye | Laterality: Right

## 2024-03-25 MED ORDER — ARMC OPHTHALMIC DILATING DROPS
1.0000 | OPHTHALMIC | Status: DC | PRN
  Administered 2024-03-25 (×3): 1 via OPHTHALMIC

## 2024-03-25 MED ORDER — TETRACAINE HCL 0.5 % OP SOLN
OPHTHALMIC | Status: AC
  Filled 2024-03-25: qty 4

## 2024-03-25 MED ORDER — FENTANYL CITRATE (PF) 100 MCG/2ML IJ SOLN
INTRAMUSCULAR | Status: DC | PRN
  Administered 2024-03-25 (×2): 50 ug via INTRAVENOUS

## 2024-03-25 MED ORDER — TETRACAINE HCL 0.5 % OP SOLN
1.0000 [drp] | OPHTHALMIC | Status: DC | PRN
  Administered 2024-03-25 (×3): 1 [drp] via OPHTHALMIC

## 2024-03-25 MED ORDER — MOXIFLOXACIN HCL 0.5 % OP SOLN
OPHTHALMIC | Status: DC | PRN
Start: 2024-03-25 — End: 2024-03-25
  Administered 2024-03-25: .2 mL via OPHTHALMIC

## 2024-03-25 MED ORDER — SIGHTPATH DOSE#1 BSS IO SOLN
INTRAOCULAR | Status: DC | PRN
  Administered 2024-03-25: 15 mL via INTRAOCULAR

## 2024-03-25 MED ORDER — SIGHTPATH DOSE#1 BSS IO SOLN
INTRAOCULAR | Status: DC | PRN
  Administered 2024-03-25: 121 mL via OPHTHALMIC

## 2024-03-25 MED ORDER — MIDAZOLAM HCL 2 MG/2ML IJ SOLN
INTRAMUSCULAR | Status: AC
  Filled 2024-03-25: qty 2

## 2024-03-25 MED ORDER — ARMC OPHTHALMIC DILATING DROPS
OPHTHALMIC | Status: AC
  Filled 2024-03-25: qty 0.5

## 2024-03-25 MED ORDER — MIDAZOLAM HCL 2 MG/2ML IJ SOLN
INTRAMUSCULAR | Status: DC | PRN
  Administered 2024-03-25 (×2): 1 mg via INTRAVENOUS

## 2024-03-25 MED ORDER — LIDOCAINE HCL (PF) 2 % IJ SOLN
INTRAOCULAR | Status: DC | PRN
  Administered 2024-03-25: 4 mL via INTRAOCULAR

## 2024-03-25 MED ORDER — FENTANYL CITRATE (PF) 100 MCG/2ML IJ SOLN
INTRAMUSCULAR | Status: AC
  Filled 2024-03-25: qty 2

## 2024-03-25 MED ORDER — SIGHTPATH DOSE#1 NA HYALUR & NA CHOND-NA HYALUR IO KIT
PACK | INTRAOCULAR | Status: DC | PRN
  Administered 2024-03-25: 1 via OPHTHALMIC

## 2024-03-25 SURGICAL SUPPLY — 13 items
CATARACT SUITE SIGHTPATH (MISCELLANEOUS) ×1 IMPLANT
DISSECTOR HYDRO NUCLEUS 50X22 (MISCELLANEOUS) ×1 IMPLANT
FEE CATARACT SUITE SIGHTPATH (MISCELLANEOUS) ×1 IMPLANT
GLOVE PI ULTRA LF STRL 7.5 (GLOVE) ×1 IMPLANT
GLOVE SURG POLYISOPRENE 8.5 (GLOVE) ×1 IMPLANT
GLOVE SURG PROTEXIS BL SZ6.5 (GLOVE) ×1 IMPLANT
GLOVE SURG SYN 6.5 PF PI BL (GLOVE) ×1 IMPLANT
GLOVE SURG SYN 8.5 PF PI BL (GLOVE) ×1 IMPLANT
LENS CLAREON VIVITY 21.0 ×1 IMPLANT
LENS IOL CLRN VT YLW 21.0 IMPLANT
NDL FILTER BLUNT 18X1 1/2 (NEEDLE) ×1 IMPLANT
NEEDLE FILTER BLUNT 18X1 1/2 (NEEDLE) ×1 IMPLANT
SYR 3ML LL SCALE MARK (SYRINGE) ×1 IMPLANT

## 2024-03-25 NOTE — Anesthesia Preprocedure Evaluation (Addendum)
 Anesthesia Evaluation  Patient identified by MRN, date of birth, ID band Patient awake    Reviewed: Allergy & Precautions, H&P , NPO status , Patient's Chart, lab work & pertinent test results  Airway Mallampati: III  TM Distance: <3 FB Neck ROM: Full    Dental no notable dental hx.    Pulmonary former smoker   Pulmonary exam normal breath sounds clear to auscultation       Cardiovascular hypertension, Normal cardiovascular exam Rhythm:Regular Rate:Normal  01-01-19 DOPPLER ECHO and OTHER SPECIAL PROCEDURES     Aortic:No AR                      No AS            143.0 cm/sec peak vel      8.2 mmHg peak grad      Mitral:MILD MR                    No MS            MV Inflow E Vel=nm*        MV Annulus E'Vel=nm*            E/E'Ratio=nm*   Tricuspid:MILD TR                    No TS            203.7 cm/sec peak TR vel   21.6 mmHg peak RV pressure   Pulmonary:No PR                      No PS            92.9 cm/sec peak vel       3.5 mmHg peak grad  Normal stress echo (showed mild MR)      Neuro/Psych  PSYCHIATRIC DISORDERS Anxiety Depression    negative neurological ROS  negative psych ROS   GI/Hepatic negative GI ROS, Neg liver ROS,GERD  ,,  Endo/Other  negative endocrine ROSHypothyroidism    Renal/GU negative Renal ROS  negative genitourinary   Musculoskeletal negative musculoskeletal ROS (+) Arthritis ,    Abdominal   Peds negative pediatric ROS (+)  Hematology negative hematology ROS (+)   Anesthesia Other Findings Thyroid disease  Hypertension Hyperlipidemia  Depression Anxiety  GERD (gastroesophageal reflux disease) Cancer (HCC)  Arthritis Blood transfusion without reported diagnosis  Mild tricuspid regurg Mild mitral regurg    Reproductive/Obstetrics negative OB ROS                              Anesthesia Physical Anesthesia Plan  ASA: 2  Anesthesia Plan: MAC    Post-op Pain Management:    Induction: Intravenous  PONV Risk Score and Plan:   Airway Management Planned: Natural Airway and Nasal Cannula  Additional Equipment:   Intra-op Plan:   Post-operative Plan:   Informed Consent: I have reviewed the patients History and Physical, chart, labs and discussed the procedure including the risks, benefits and alternatives for the proposed anesthesia with the patient or authorized representative who has indicated his/her understanding and acceptance.     Dental Advisory Given  Plan Discussed with: Anesthesiologist, CRNA and Surgeon  Anesthesia Plan Comments: (Patient consented for risks of anesthesia including but not limited to:  - adverse reactions to medications - damage to eyes, teeth, lips or other oral mucosa - nerve damage due  to positioning  - sore throat or hoarseness - Damage to heart, brain, nerves, lungs, other parts of body or loss of life  Patient voiced understanding and assent.)         Anesthesia Quick Evaluation

## 2024-03-25 NOTE — Op Note (Signed)
 OPERATIVE NOTE  [facility] [id number] 03/25/2024   PREOPERATIVE DIAGNOSIS:  Nuclear sclerotic cataract right eye.  H25.11   POSTOPERATIVE DIAGNOSIS:    Nuclear sclerotic cataract right eye.     PROCEDURE:  Phacoemusification with posterior chamber intraocular lens placement of the right eye   LENS:   Implant Name Type Inv. Item Serial No. Manufacturer Lot No. LRB No. Used Action  LENS [employer] VIVITY 21.0 - [id number]  LENS CLAREON VIVITY 21.0 [id number] [employer]  Right 1 Implanted       Procedure(s): PHACOEMULSIFICATION, CATARACT, WITH IOL INSERTION 13.25 01:12.0 (Right)  SURGEON:  Dusty Gin, MD, MPH  ANESTHESIOLOGIST: Anesthesiologist: Emilie Harden, MD CRNA: Jahoo, Sonia, CRNA   ANESTHESIA:  Topical with tetracaine drops augmented with 1% preservative-free intracameral lidocaine.  ESTIMATED BLOOD LOSS: less than 1 mL.   COMPLICATIONS:  None.   DESCRIPTION OF PROCEDURE:  The patient was identified in the holding room and transported to the operating room and placed in the supine position under the operating microscope.  The right eye was identified as the operative eye and it was prepped and draped in the usual sterile ophthalmic fashion.   A 1.0 millimeter clear-corneal paracentesis was made at the 10:30 position. 0.5 ml of preservative-free 1% lidocaine with epinephrine was injected into the anterior chamber.  The anterior chamber was filled with viscoelastic.  A 2.4 millimeter keratome was used to make a near-clear corneal incision at the 8:00 position.  A curvilinear capsulorrhexis was made with a cystotome and capsulorrhexis forceps.  Balanced salt solution was used to hydrodissect and hydrodelineate the nucleus.   Phacoemulsification was then used in stop and chop fashion to remove the lens nucleus and epinucleus.  The remaining cortex was then removed using the irrigation and aspiration handpiece. Viscoelastic was then placed into the capsular bag to  distend it for lens placement.  A lens was then injected into the capsular bag.  The remaining viscoelastic was aspirated.   Wounds were hydrated with balanced salt solution.  The anterior chamber was inflated to a physiologic pressure with balanced salt solution.   Intracameral vigamox 0.1 mL undiluted was injected into the eye and a drop placed onto the ocular surface.  No wound leaks were noted.  The patient was taken to the recovery room in stable condition without complications of anesthesia or surgery  Dusty Gin 03/25/2024, 9:44 AM

## 2024-03-25 NOTE — Telephone Encounter (Signed)
 Called and scheduled patient with Dr Gwendalyn Lemma at [facility] 04/02/24 1030AM. Patient aware of appointment.

## 2024-03-25 NOTE — Progress Notes (Signed)
 Date:  03/25/2024   Name:  Brenda Campbell   DOB:  1951-07-03   MRN:  [id number]   Chief Complaint: Atrial Fibrillation  Atrial Fibrillation Presents for initial visit. Symptoms are negative for bradycardia, chest pain, dizziness, hemodynamic instability, hypertension, hypotension, pacemaker problem, palpitations, shortness of breath, syncope, tachycardia and weakness. The symptoms have been stable. Past treatments include nothing. Past compliance problems: his upper gi bleed. Past medical history includes atrial fibrillation and hyperlipidemia. There is no history of HTN.    Lab Results  Component Value Date   NA 141 02/27/2024   K 4.3 02/27/2024   CO2 26 02/27/2024   GLUCOSE 95 02/27/2024   BUN 10 02/27/2024   CREATININE 0.58 02/27/2024   CALCIUM 8.9 02/27/2024   EGFR 95 02/27/2024   GFRNONAA 90 10/05/2020   Lab Results  Component Value Date   CHOL 184 02/27/2024   HDL 66 02/27/2024   LDLCALC 90 02/27/2024   TRIG 163 (H) 02/27/2024   CHOLHDL 4.3 10/22/2018   Lab Results  Component Value Date   TSH 2.620 02/27/2024   Lab Results  Component Value Date   HGBA1C 5.5 02/27/2024   Lab Results  Component Value Date   WBC 5.7 02/14/2020   HGB 14.5 09/07/2021   HCT 42.4 02/14/2020   MCV 98 (H) 02/14/2020   PLT 248 02/14/2020   Lab Results  Component Value Date   ALT 21 02/27/2024   AST 26 02/27/2024   ALKPHOS 95 02/27/2024   BILITOT 0.4 02/27/2024   No results found for: "25OHVITD2", "25OHVITD3", "VD25OH"   Review of Systems  Respiratory:  Negative for chest tightness, shortness of breath and wheezing.   Cardiovascular:  Negative for chest pain, palpitations and syncope.  Gastrointestinal:  Negative for abdominal distention and abdominal pain.  Neurological:  Negative for dizziness, seizures, facial asymmetry, speech difficulty, weakness and light-headedness.    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   Gastroesophageal reflux disease 12/22/2014   Hypothyroidism 12/22/2014   Obesity 12/22/2014   Tachycardia 12/22/2014   Malaise 12/22/2014    Allergies  Allergen Reactions   Aspirin     Avoids D/T Hx of GI Bleed    Past Surgical History:  Procedure Laterality Date   CESAREAN SECTION     x 1   CHOLECYSTECTOMY     COLONOSCOPY  10/12/2014   cleared for 3 years   COLONOSCOPY N/A 03/16/2017   Procedure: COLONOSCOPY;  Surgeon: Midge Minium, MD;  Location: [facility] ENDOSCOPY;  Service: Endoscopy;  Laterality: N/A;   COLONOSCOPY WITH PROPOFOL N/A 12/01/2017   Procedure: COLONOSCOPY WITH PROPOFOL;  Surgeon: Wyline Mood, MD;  Location: [facility] ENDOSCOPY;  Service: Gastroenterology;  Laterality: N/A;   COLONOSCOPY WITH PROPOFOL N/A 04/12/2021   Procedure: COLONOSCOPY WITH PROPOFOL;  Surgeon: Wyline Mood, MD;  Location: [facility] ENDOSCOPY;  Service: Gastroenterology;  Laterality: N/A;   ESOPHAGOGASTRODUODENOSCOPY (EGD) WITH PROPOFOL N/A 03/15/2017   Procedure: ESOPHAGOGASTRODUODENOSCOPY (EGD) WITH PROPOFOL;  Surgeon: Wyline Mood, MD;  Location: [facility] ENDOSCOPY;  Service: Endoscopy;  Laterality: N/A;  ESOPHAGOGASTRODUODENOSCOPY (EGD) WITH PROPOFOL N/A 03/30/2017   Procedure: ESOPHAGOGASTRODUODENOSCOPY (EGD) WITH PROPOFOL;  Surgeon: Luke Salaam, MD;  Location: [facility] ENDOSCOPY;  Service: Endoscopy;  Laterality: N/A;  Endoscopic Capsule Placement   ESOPHAGOGASTRODUODENOSCOPY (EGD) WITH PROPOFOL N/A 12/01/2017   Procedure: ESOPHAGOGASTRODUODENOSCOPY (EGD) WITH PROPOFOL;  Surgeon: Luke Salaam, MD;  Location: [facility] ENDOSCOPY;  Service: Gastroenterology;  Laterality: N/A;   ESOPHAGOGASTRODUODENOSCOPY (EGD) WITH PROPOFOL N/A 04/12/2021   Procedure: ESOPHAGOGASTRODUODENOSCOPY (EGD) WITH PROPOFOL;  Surgeon: Luke Salaam, MD;  Location: [facility] ENDOSCOPY;  Service: Gastroenterology;  Laterality: N/A;   FRACTURE SURGERY     GALLBLADDER SURGERY     GIVENS CAPSULE STUDY N/A 03/17/2017   Procedure: GIVENS CAPSULE STUDY;  Surgeon: Marnee Sink, MD;  Location: [facility] ENDOSCOPY;  Service: Endoscopy;  Laterality: N/A;   GIVENS CAPSULE STUDY N/A 12/01/2017   Procedure: GIVENS CAPSULE STUDY, 12 HOUR;  Surgeon: Luke Salaam, MD;  Location: [facility] ENDOSCOPY;  Service: Gastroenterology;  Laterality: N/A;   SMALL INTESTINE SURGERY     THROAT SURGERY     nodule on vocal chord   TIBIA FRACTURE SURGERY Right    WRIST SURGERY Right    arthritis    Social History   Tobacco Use   Smoking status: Former    Current packs/day: 0.00    Average packs/day: 1.5 packs/day for 27.0 years (40.5 ttl pk-yrs)    Types: Cigarettes    Start date: 36    Quit date: 1999    Years since quitting: 26.3   Smokeless tobacco: Never  Vaping Use   Vaping status: Never Used  Substance Use Topics   Alcohol use: Yes    Alcohol/week: 20.0 standard drinks of alcohol    Types: 20 Shots of liquor per week    Comment: 3-4  cocktails 6 days per week   Drug use: No     Medication list has been reviewed and updated.  Current Meds  Medication Sig   benzonatate (TESSALON) 100 MG capsule Take 1 capsule (100 mg total) by mouth 3 (three) times daily as needed for cough.   cetirizine (ZYRTEC) 10 MG tablet Take 10 mg by mouth daily as needed for allergies.   clotrimazole-betamethasone (LOTRISONE) cream Apply externally BID for 2 wks   conjugated estrogens (PREMARIN) vaginal cream Estrogen Cream Instruction Discard applicator Apply pea sized amount to tip of finger to urethra before bed. Wash hands well after application. Use  Monday, Wednesday and Friday   Cyanocobalamin (VITAMIN B-12 SL) Place 2,000 mcg under the tongue daily.   ipratropium (ATROVENT) 0.06 % nasal spray Place 2 sprays into both nostrils 4 (four) times daily.   levothyroxine (SYNTHROID) 50 MCG tablet Take 1 tablet (50 mcg total) by mouth daily.   magnesium gluconate (MAGONATE) 500 MG tablet Take 500 mg by mouth daily.   methenamine (HIPREX) 1 g tablet Take 1 tablet (1 g total) by mouth 2 (two) times daily with a meal.   nitrofurantoin, macrocrystal-monohydrate, (MACROBID) 100 MG capsule Take 1 capsule (100 mg total) by mouth at bedtime.   omeprazole (PRILOSEC) 20 MG capsule TAKE 1 CAPSULE BY MOUTH TWICE A DAY   Probiotic Product (PROBIOTIC DAILY) CAPS Take 1 capsule by mouth daily.   promethazine-dextromethorphan (PROMETHAZINE-DM) 6.25-15 MG/5ML syrup Take 5 mLs by mouth 4 (four) times daily as needed.   rosuvastatin (CRESTOR) 5 MG tablet Take 1 tablet (5 mg total) by mouth daily.   sertraline (ZOLOFT) 50 MG tablet Take 1 tablet (50 mg total) by mouth daily.   UNABLE TO FIND  Lion's Mane 1000mg  1 tablet daily       02/27/2024    2:58 PM 08/31/2023    2:05 PM 03/08/2023    9:24 AM 02/03/2023   10:06 AM  GAD 7 : Generalized Anxiety Score  Nervous, Anxious, on Edge 1 0 0 0  Control/stop worrying 1 0 0 0  Worry too much - different things 1 0 0 0  Trouble relaxing 0 0 0 0  Restless 0 0 0 0  Easily annoyed or irritable 0 0 0 0  Afraid - awful might happen 0 0 0 0  Total GAD 7 Score 3 0 0 0  Anxiety Difficulty Not difficult at all Not difficult at all Not difficult at all Not difficult at all       02/27/2024    2:57 PM 11/06/2023    1:44 PM 08/31/2023    2:05 PM  Depression screen PHQ 2/9  Decreased Interest 2 0 0  Down, Depressed, Hopeless 0 0 0  PHQ - 2 Score 2 0 0  Altered sleeping 0 0 0  Tired, decreased energy 3 0 0  Change in appetite 2 0 0  Feeling bad or failure about yourself  0 0 0  Trouble concentrating 1 0 0  Moving slowly  or fidgety/restless 2 0 0  Suicidal thoughts 0 0 0  PHQ-9 Score 10 0 0  Difficult doing work/chores Somewhat difficult Not difficult at all Not difficult at all    BP Readings from Last 3 Encounters:  03/25/24 132/84  03/25/24 135/77  02/27/24 122/78    Physical Exam Cardiovascular:     Rate and Rhythm: Normal rate.     Pulses: Normal pulses.     Heart sounds: Normal heart sounds. No murmur heard.    No friction rub. No gallop.  Pulmonary:     Effort: No respiratory distress.     Breath sounds: No stridor. No wheezing, rhonchi or rales.  Chest:     Chest wall: No tenderness.  Abdominal:     General: There is no distension.     Tenderness: There is no abdominal tenderness. There is no guarding or rebound.  Musculoskeletal:     Cervical back: Normal range of motion.     Wt Readings from Last 3 Encounters:  03/25/24 232 lb 12.8 oz (105.6 kg)  03/25/24 232 lb 12.8 oz (105.6 kg)  02/27/24 229 lb 9.6 oz (104.1 kg)    BP 132/84   Pulse (!) 105   Resp 16   Ht 5' 7.01" (1.702 m)   Wt 232 lb 12.8 oz (105.6 kg)   SpO2 99%   BMI 36.45 kg/m   Assessment and Plan:  1. Tachycardia (Primary) Patient was noted to be in tacky dysrhythmia which was captured in atrial fibrillation while undergoing surgery.  Patient proceeded to have cataract surgery without complications and I was called given the new development and asked patient to come over and I would see her that afternoon for evaluation.  We started with a repeat of an EKG because there was low voltage and we wanted to make sure there was indeed no P waves that were noted and it was not an artifact of the low voltage of the EKG.  This was confirmed that there were no P waves and that there was indeed irregularity consistent with atrial fibrillation. - EKG 12-Lead  2. Atrial fibrillation, new onset [facility]) Patient was noted to be new onset atrial fibrillation and has had cardiology evaluation  in the not-too-distant past which did  not note any of this previous.  There is no valvular disease by history we will refer to cardiology for evaluation in that the patient has a second cataract surgery that is pending and has an upcoming cruise/trip that she would like to be able to fit in as well and I told her that we need to evaluate her prior.  The other complicating factor is that she has had a history of an upper GI bleed which she was taking Goody's powder on a daily basis however her GI evaluation did not reveal a cause and affect due to her taking of Goody powder medication. - EKG 12-Lead - Ambulatory referral to Cardiology   Alayne Allis, MD

## 2024-03-25 NOTE — H&P (Signed)
 [facility]   Primary Care Physician:  Duanne Limerick, MD Ophthalmologist: Dr. Willey Blade  Pre-Procedure History & Physical: HPI:  Brenda Campbell is a 73 y.o. female here for cataract surgery.   Past Medical History:  Diagnosis Date   Anxiety    Arthritis    Blood transfusion without reported diagnosis    Cancer (HCC)    small bowel ca   Depression    GERD (gastroesophageal reflux disease)    Hyperlipidemia    Hypertension    Mild mitral regurgitation by prior echocardiogram    Thyroid disease     Past Surgical History:  Procedure Laterality Date   CESAREAN SECTION     x 1   CHOLECYSTECTOMY     COLONOSCOPY  10/12/2014   cleared for 3 years   COLONOSCOPY N/A 03/16/2017   Procedure: COLONOSCOPY;  Surgeon: Midge Minium, MD;  Location: [facility] ENDOSCOPY;  Service: Endoscopy;  Laterality: N/A;   COLONOSCOPY WITH PROPOFOL N/A 12/01/2017   Procedure: COLONOSCOPY WITH PROPOFOL;  Surgeon: Wyline Mood, MD;  Location: [facility] ENDOSCOPY;  Service: Gastroenterology;  Laterality: N/A;   COLONOSCOPY WITH PROPOFOL N/A 04/12/2021   Procedure: COLONOSCOPY WITH PROPOFOL;  Surgeon: Wyline Mood, MD;  Location: [facility] ENDOSCOPY;  Service: Gastroenterology;  Laterality: N/A;   ESOPHAGOGASTRODUODENOSCOPY (EGD) WITH PROPOFOL N/A 03/15/2017   Procedure: ESOPHAGOGASTRODUODENOSCOPY (EGD) WITH PROPOFOL;  Surgeon: Wyline Mood, MD;  Location: [facility] ENDOSCOPY;  Service: Endoscopy;  Laterality: N/A;   ESOPHAGOGASTRODUODENOSCOPY (EGD) WITH PROPOFOL N/A 03/30/2017   Procedure: ESOPHAGOGASTRODUODENOSCOPY (EGD) WITH PROPOFOL;  Surgeon: Wyline Mood, MD;  Location: [facility] ENDOSCOPY;  Service: Endoscopy;  Laterality: N/A;  Endoscopic Capsule Placement   ESOPHAGOGASTRODUODENOSCOPY (EGD) WITH PROPOFOL N/A 12/01/2017   Procedure: ESOPHAGOGASTRODUODENOSCOPY (EGD) WITH PROPOFOL;  Surgeon: Wyline Mood, MD;  Location: [facility] ENDOSCOPY;  Service: Gastroenterology;  Laterality: N/A;   ESOPHAGOGASTRODUODENOSCOPY (EGD)  WITH PROPOFOL N/A 04/12/2021   Procedure: ESOPHAGOGASTRODUODENOSCOPY (EGD) WITH PROPOFOL;  Surgeon: Wyline Mood, MD;  Location: [facility] ENDOSCOPY;  Service: Gastroenterology;  Laterality: N/A;   FRACTURE SURGERY     GALLBLADDER SURGERY     GIVENS CAPSULE STUDY N/A 03/17/2017   Procedure: GIVENS CAPSULE STUDY;  Surgeon: Midge Minium, MD;  Location: [facility] ENDOSCOPY;  Service: Endoscopy;  Laterality: N/A;   GIVENS CAPSULE STUDY N/A 12/01/2017   Procedure: GIVENS CAPSULE STUDY, 12 HOUR;  Surgeon: Wyline Mood, MD;  Location: [facility] ENDOSCOPY;  Service: Gastroenterology;  Laterality: N/A;   SMALL INTESTINE SURGERY     THROAT SURGERY     nodule on vocal chord   TIBIA FRACTURE SURGERY Right    WRIST SURGERY Right    arthritis    Prior to Admission medications   Medication Sig Start Date End Date Taking? Authorizing Provider  cetirizine (ZYRTEC) 10 MG tablet Take 10 mg by mouth daily as needed for allergies.   Yes [provider]  clotrimazole-betamethasone (LOTRISONE) cream Apply externally BID for 2 wks 02/27/24  Yes Duanne Limerick, MD  conjugated estrogens (PREMARIN) vaginal cream Estrogen Cream Instruction Discard applicator Apply pea sized amount to tip of finger to urethra before bed. Wash hands well after application. Use Monday, Wednesday and Friday 08/13/21  Yes Vanna Scotland, MD  Cyanocobalamin (VITAMIN B-12 SL) Place 2,000 mcg under the tongue daily.   Yes [provider]  ipratropium (ATROVENT) 0.06 % nasal spray Place 2 sprays into both nostrils 4 (four) times daily. 09/16/23  Yes Becky Augusta, NP  levothyroxine (SYNTHROID) 50 MCG tablet Take 1 tablet (50 mcg total) by mouth  daily. 02/27/24  Yes Duanne Limerick, MD  magnesium gluconate (MAGONATE) 500 MG tablet Take 500 mg by mouth daily.   Yes [provider]  methenamine (HIPREX) 1 g tablet Take 1 tablet (1 g total) by mouth 2 (two) times daily with a meal. 07/19/23  Yes Vaillancourt, Samantha, PA-C  nitrofurantoin,  macrocrystal-monohydrate, (MACROBID) 100 MG capsule Take 1 capsule (100 mg total) by mouth at bedtime. 01/31/24  Yes McGowan, Carollee Herter A, PA-C  omeprazole (PRILOSEC) 20 MG capsule TAKE 1 CAPSULE BY MOUTH TWICE A DAY 10/13/23  Yes Duanne Limerick, MD  Probiotic Product (PROBIOTIC DAILY) CAPS Take 1 capsule by mouth daily.   Yes [provider]  rosuvastatin (CRESTOR) 5 MG tablet Take 1 tablet (5 mg total) by mouth daily. 02/27/24  Yes Duanne Limerick, MD  sertraline (ZOLOFT) 50 MG tablet Take 1 tablet (50 mg total) by mouth daily. 02/27/24  Yes Duanne Limerick, MD  UNABLE TO FIND Lion's Mane 1000mg  1 tablet daily   Yes [provider]  benzonatate (TESSALON) 100 MG capsule Take 1 capsule (100 mg total) by mouth 3 (three) times daily as needed for cough. Patient not taking: Reported on 03/14/2024 02/27/24   Duanne Limerick, MD  promethazine-dextromethorphan (PROMETHAZINE-DM) 6.25-15 MG/5ML syrup Take 5 mLs by mouth 4 (four) times daily as needed. Patient not taking: Reported on 03/14/2024 09/16/23   Becky Augusta, NP    Allergies as of 02/27/2024   (No Known Allergies)    Family History  Problem Relation Age of Onset   Diabetes Mother    Heart disease Mother    Alzheimer's disease Mother    Other Father 81       "old age"   Diabetes Brother    Breast cancer Neg Hx     Social History   Socioeconomic History   Marital status: Married    Spouse name: Leonette Most   Number of children: 1   Years of education: Not on file   Highest education level: Associate degree: occupational, Scientist, product/process development, or vocational program  Occupational History   Occupation: retired  Tobacco Use   Smoking status: Former    Current packs/day: 0.00    Average packs/day: 1.5 packs/day for 27.0 years (40.5 ttl pk-yrs)    Types: Cigarettes    Start date: 73    Quit date: 1999    Years since quitting: 26.3   Smokeless tobacco: Never  Vaping Use   Vaping status: Never Used  Substance and Sexual Activity    Alcohol use: Yes    Alcohol/week: 20.0 standard drinks of alcohol    Types: 20 Shots of liquor per week    Comment: 3-4  cocktails 6 days per week   Drug use: No   Sexual activity: Not Currently  Other Topics Concern   Not on file  Social History Narrative   Retired, but substitute teaches occasionally. Married with 1 child and 1 step-child   Social Drivers of Corporate investment banker Strain: Low Risk  (02/26/2024)   Overall Financial Resource Strain (CARDIA)    Difficulty of Paying Living Expenses: Not hard at all  Food Insecurity: No Food Insecurity (02/26/2024)   Hunger Vital Sign    Worried About Running Out of Food in the Last Year: Never true    Ran Out of Food in the Last Year: Never true  Transportation Needs: No Transportation Needs (02/26/2024)   PRAPARE - Administrator, Civil Service (Medical): No  Lack of Transportation (Non-Medical): No  Physical Activity: Insufficiently Active (02/26/2024)   Exercise Vital Sign    Days of Exercise per Week: 1 day    Minutes of Exercise per Session: 30 min  Stress: Stress Concern Present (02/26/2024)   [employer] of Occupational Health - Occupational Stress Questionnaire    Feeling of Stress : To some extent  Social Connections: Moderately Integrated (02/26/2024)   Social Connection and Isolation Panel [NHANES]    Frequency of Communication with Friends and Family: More than three times a week    Frequency of Social Gatherings with Friends and Family: Once a week    Attends Religious Services: 1 to 4 times per year    Active Member of [employer] or Organizations: No    Attends Banker Meetings: Never    Marital Status: Married  Catering manager Violence: Not At Risk (11/06/2023)   Humiliation, Afraid, Rape, and Kick questionnaire    Fear of Current or Ex-Partner: No    Emotionally Abused: No    Physically Abused: No    Sexually Abused: No    Review of Systems: See HPI, otherwise negative  ROS  Physical Exam: BP (!) 130/90   Temp (!) 97.1 F (36.2 C) (Temporal)   Resp 14   Ht 5' 7.01" (1.702 m)   Wt 105.6 kg   SpO2 96%   BMI 36.45 kg/m  General:   Alert, cooperative. Head:  Normocephalic and atraumatic. Respiratory:  Normal work of breathing. Cardiovascular:  NAD  Impression/Plan: Turkey Ward Rudden is here for cataract surgery.  Risks, benefits, limitations, and alternatives regarding cataract surgery have been reviewed with the patient.  Questions have been answered.  All parties agreeable.   Dusty Gin, MD  03/25/2024, 9:18 AM

## 2024-03-25 NOTE — Anesthesia Postprocedure Evaluation (Signed)
 Anesthesia Post Note  Patient: Brenda Campbell  Procedure(s) Performed: PHACOEMULSIFICATION, CATARACT, WITH IOL INSERTION 13.25 01:12.0 (Right: Eye)  Patient location during evaluation: PACU Anesthesia Type: MAC Level of consciousness: awake and alert Pain management: pain level controlled Vital Signs Assessment: post-procedure vital signs reviewed and stable Respiratory status: spontaneous breathing, nonlabored ventilation, respiratory function stable and patient connected to nasal cannula oxygen Cardiovascular status: stable and blood pressure returned to baseline Postop Assessment: no apparent nausea or vomiting Anesthetic complications: no Comments: I don't see a place to document "irregular" heartbeat above under "Cardiovascular status."  Irregular heart rate, EKG shows new onset atrial fibrillation, low voltage, spoke on phone with patient's physician, Dr. Rochelle Chu, who has office just above the ASC. Patient to go home and return to Dr. Rochelle Chu at 1340 today (1:40 pm, explained to patient).  Patient and husband aware, patient will go home and return to Dr. Rochelle Chu' office today at 1:40pm.  Patient is not distressed, in fact, she is too nonchalent, as her father had atrial fibrillation "and he lived to be 54!"  Patient stated she could "take enough goodie powders and I won't need to take blood thinners." Discussed w/patient that is a bad option; she already had a GI bleed, plus other medical reasons not to "take enough goodie powders" to try to prevent blood clots.  Discussed that with Dr. Rochelle Chu, who will discuss with patient and remind patient why "taking enough goodie powders" is a very bad idea.  Patient discharged  via wheelchair, accompanied by husband, husband will drive car, patient to return to Dr. Rochelle Chu' office or call ambulance if sighs/symptoms stroke or heart attack.  Discussed.    No notable events documented.   Last Vitals:  Vitals:   03/25/24 0945 03/25/24 1000  BP: (!)  130/94 135/77  Pulse: (!) 103 91  Resp: 14 15  Temp: (!) 36.3 C   SpO2: 95% 95%    Last Pain:  Vitals:   03/25/24 1000  TempSrc:   PainSc: 0-No pain                 Chloey Ricard C Satrina Magallanes

## 2024-03-25 NOTE — Progress Notes (Signed)
 Patient noted to be in atrial fibrillation in recovery room. MD Pelham contacting patient's PCP to get an appointment to be seen.

## 2024-03-25 NOTE — Transfer of Care (Signed)
 Immediate Anesthesia Transfer of Care Note  Patient: [name]  Procedure(s) Performed: PHACOEMULSIFICATION, CATARACT, WITH IOL INSERTION 13.25 01:12.0 (Right: Eye)  Patient Location: PACU  Anesthesia Type: MAC  Level of Consciousness: awake, alert  and patient cooperative  Airway and Oxygen Therapy: Patient Spontanous Breathing and Patient connected to supplemental oxygen  Post-op Assessment: Post-op Vital signs reviewed, Patient's Cardiovascular Status Stable, Respiratory Function Stable, Patent Airway and No signs of Nausea or vomiting  Post-op Vital Signs: Reviewed and stable  Complications: No notable events documented.

## 2024-03-26 ENCOUNTER — Encounter: Payer: Self-pay | Admitting: Ophthalmology

## 2024-03-26 DIAGNOSIS — H2512 Age-related nuclear cataract, left eye: Secondary | ICD-10-CM | POA: Diagnosis not present

## 2024-03-26 NOTE — Anesthesia Preprocedure Evaluation (Addendum)
 Anesthesia Evaluation  Patient identified by MRN, date of birth, ID band Patient awake    Reviewed: Allergy & Precautions, H&P , NPO status , Patient's Chart, lab work & pertinent test results  Airway Mallampati: III  TM Distance: <3 FB Neck ROM: Full    Dental no notable dental hx.    Pulmonary neg pulmonary ROS, former smoker   Pulmonary exam normal breath sounds clear to auscultation       Cardiovascular hypertension, negative cardio ROS Normal cardiovascular exam Rhythm:Regular Rate:Normal  01-01-19 DOPPLER ECHO and OTHER SPECIAL PROCEDURES     Aortic:No AR                      No AS            143.0 cm/sec peak vel      8.2 mmHg peak grad       Mitral:MILD MR                    No MS            MV Inflow E Vel=nm*        MV Annulus E'Vel=nm*            E/E'Ratio=nm*    Tricuspid:MILD TR                    No TS            203.7 cm/sec peak TR vel   21.6 mmHg peak RV pressure    Pulmonary:No PR                      No PS            92.9 cm/sec peak vel       3.5 mmHg peak grad  Normal stress echo (showed mild MR)        Neuro/Psych  PSYCHIATRIC DISORDERS Anxiety Depression    negative neurological ROS  negative psych ROS   GI/Hepatic negative GI ROS, Neg liver ROS,GERD  ,,  Endo/Other  negative endocrine ROSHypothyroidism    Renal/GU negative Renal ROS  negative genitourinary   Musculoskeletal negative musculoskeletal ROS (+) Arthritis ,    Abdominal   Peds negative pediatric ROS (+)  Hematology negative hematology ROS (+)   Anesthesia Other Findings Previous Cataract surgery 03-25-24 Dr. Aldo Amble  Thyroid  disease  Hypertension Hyperlipidemia  Depression Anxiety  GERD (gastroesophageal reflux disease) Cancer (HCC)  Arthritis Blood transfusion without reported diagnosis Mild mitral regurgitation by prior echocardiogram Mild mitral regurgitation by prior echocardiogram  Mild tricuspid  regurgitation by prior echocardiogram A-fib (HCC)      Reproductive/Obstetrics negative OB ROS                             Anesthesia Physical Anesthesia Plan  ASA: 2  Anesthesia Plan: MAC   Post-op Pain Management:    Induction: Intravenous  PONV Risk Score and Plan:   Airway Management Planned: Natural Airway and Nasal Cannula  Additional Equipment:   Intra-op Plan:   Post-operative Plan:   Informed Consent: I have reviewed the patients History and Physical, chart, labs and discussed the procedure including the risks, benefits and alternatives for the proposed anesthesia with the patient or authorized representative who has indicated his/her understanding and acceptance.     Dental Advisory Given  Plan Discussed with: Anesthesiologist, CRNA and Surgeon  Anesthesia  Plan Comments: (Patient consented for risks of anesthesia including but not limited to:  - adverse reactions to medications - damage to eyes, teeth, lips or other oral mucosa - nerve damage due to positioning  - sore throat or hoarseness - Damage to heart, brain, nerves, lungs, other parts of body or loss of life  Patient voiced understanding and assent.)        Anesthesia Quick Evaluation

## 2024-04-02 DIAGNOSIS — I480 Paroxysmal atrial fibrillation: Secondary | ICD-10-CM | POA: Diagnosis not present

## 2024-04-02 DIAGNOSIS — I471 Supraventricular tachycardia, unspecified: Secondary | ICD-10-CM | POA: Diagnosis not present

## 2024-04-02 DIAGNOSIS — E782 Mixed hyperlipidemia: Secondary | ICD-10-CM | POA: Diagnosis not present

## 2024-04-03 NOTE — Discharge Instructions (Signed)

## 2024-04-08 ENCOUNTER — Ambulatory Visit: Payer: Self-pay | Admitting: Anesthesiology

## 2024-04-08 ENCOUNTER — Other Ambulatory Visit: Payer: Self-pay

## 2024-04-08 ENCOUNTER — Encounter
Admission: RE | Disposition: A | Discharge status: Home / Self Care | Payer: Self-pay | Source: Home / Self Care | Attending: Ophthalmology

## 2024-04-08 ENCOUNTER — Ambulatory Visit
Admission: RE | Admit: 2024-04-08 | Discharge: 2024-04-08 | Disposition: A | Discharge status: Home / Self Care | Attending: Ophthalmology | Admitting: Ophthalmology

## 2024-04-08 ENCOUNTER — Encounter: Payer: Self-pay | Admitting: Ophthalmology

## 2024-04-08 DIAGNOSIS — Z87891 Personal history of nicotine dependence: Secondary | ICD-10-CM | POA: Insufficient documentation

## 2024-04-08 DIAGNOSIS — I10 Essential (primary) hypertension: Secondary | ICD-10-CM | POA: Insufficient documentation

## 2024-04-08 DIAGNOSIS — M1990 Unspecified osteoarthritis, unspecified site: Secondary | ICD-10-CM | POA: Diagnosis not present

## 2024-04-08 DIAGNOSIS — K219 Gastro-esophageal reflux disease without esophagitis: Secondary | ICD-10-CM | POA: Diagnosis not present

## 2024-04-08 DIAGNOSIS — I4891 Unspecified atrial fibrillation: Secondary | ICD-10-CM | POA: Diagnosis not present

## 2024-04-08 DIAGNOSIS — F32A Depression, unspecified: Secondary | ICD-10-CM | POA: Diagnosis not present

## 2024-04-08 DIAGNOSIS — Z8249 Family history of ischemic heart disease and other diseases of the circulatory system: Secondary | ICD-10-CM | POA: Insufficient documentation

## 2024-04-08 DIAGNOSIS — H2512 Age-related nuclear cataract, left eye: Secondary | ICD-10-CM | POA: Insufficient documentation

## 2024-04-08 DIAGNOSIS — F419 Anxiety disorder, unspecified: Secondary | ICD-10-CM | POA: Insufficient documentation

## 2024-04-08 HISTORY — PX: CATARACT EXTRACTION W/PHACO: SHX586

## 2024-04-08 SURGERY — PHACOEMULSIFICATION, CATARACT, WITH IOL INSERTION
Anesthesia: Monitor Anesthesia Care | Site: Eye | Laterality: Left

## 2024-04-08 MED ORDER — LIDOCAINE HCL (PF) 2 % IJ SOLN
INTRAOCULAR | Status: DC | PRN
  Administered 2024-04-08: 4 mL via INTRAOCULAR

## 2024-04-08 MED ORDER — SIGHTPATH DOSE#1 BSS IO SOLN
INTRAOCULAR | Status: DC | PRN
  Administered 2024-04-08: 89 mL via OPHTHALMIC

## 2024-04-08 MED ORDER — TETRACAINE HCL 0.5 % OP SOLN
OPHTHALMIC | Status: AC
  Filled 2024-04-08: qty 4

## 2024-04-08 MED ORDER — ARMC OPHTHALMIC DILATING DROPS
OPHTHALMIC | Status: AC
  Filled 2024-04-08: qty 0.5

## 2024-04-08 MED ORDER — ARMC OPHTHALMIC DILATING DROPS
1.0000 | OPHTHALMIC | Status: DC | PRN
Start: 2024-04-08 — End: 2024-04-08
  Administered 2024-04-08 (×3): 1 via OPHTHALMIC

## 2024-04-08 MED ORDER — SIGHTPATH DOSE#1 BSS IO SOLN
INTRAOCULAR | Status: DC | PRN
  Administered 2024-04-08: 15 mL via INTRAOCULAR

## 2024-04-08 MED ORDER — MIDAZOLAM HCL 2 MG/2ML IJ SOLN
INTRAMUSCULAR | Status: AC
  Filled 2024-04-08: qty 2

## 2024-04-08 MED ORDER — SIGHTPATH DOSE#1 NA HYALUR & NA CHOND-NA HYALUR IO KIT
PACK | INTRAOCULAR | Status: DC | PRN
  Administered 2024-04-08: 1 via OPHTHALMIC

## 2024-04-08 MED ORDER — MIDAZOLAM HCL 2 MG/2ML IJ SOLN
INTRAMUSCULAR | Status: DC | PRN
  Administered 2024-04-08: 2 mg via INTRAVENOUS

## 2024-04-08 MED ORDER — FENTANYL CITRATE (PF) 100 MCG/2ML IJ SOLN
INTRAMUSCULAR | Status: AC
  Filled 2024-04-08: qty 2

## 2024-04-08 MED ORDER — MOXIFLOXACIN HCL 0.5 % OP SOLN
OPHTHALMIC | Status: DC | PRN
Start: 2024-04-08 — End: 2024-04-08
  Administered 2024-04-08: .2 mL via OPHTHALMIC

## 2024-04-08 MED ORDER — FENTANYL CITRATE (PF) 100 MCG/2ML IJ SOLN
INTRAMUSCULAR | Status: DC | PRN
  Administered 2024-04-08 (×2): 50 ug via INTRAVENOUS

## 2024-04-08 MED ORDER — TETRACAINE HCL 0.5 % OP SOLN
1.0000 [drp] | OPHTHALMIC | Status: DC | PRN
  Administered 2024-04-08 (×3): 1 [drp] via OPHTHALMIC

## 2024-04-08 SURGICAL SUPPLY — 13 items
CATARACT SUITE SIGHTPATH (MISCELLANEOUS) ×1 IMPLANT
DISSECTOR HYDRO NUCLEUS 50X22 (MISCELLANEOUS) ×1 IMPLANT
FEE CATARACT SUITE SIGHTPATH (MISCELLANEOUS) ×1 IMPLANT
GLOVE PI ULTRA LF STRL 7.5 (GLOVE) ×1 IMPLANT
GLOVE SURG POLYISOPRENE 8.5 (GLOVE) ×1 IMPLANT
GLOVE SURG PROTEXIS BL SZ6.5 (GLOVE) ×1 IMPLANT
GLOVE SURG SYN 6.5 PF PI BL (GLOVE) ×1 IMPLANT
GLOVE SURG SYN 8.5 PF PI BL (GLOVE) ×1 IMPLANT
LENS CLAREON VIVITY 20.5 ×1 IMPLANT
LENS IOL CLRN VT YLW 20.5 IMPLANT
NDL FILTER BLUNT 18X1 1/2 (NEEDLE) ×1 IMPLANT
NEEDLE FILTER BLUNT 18X1 1/2 (NEEDLE) ×1 IMPLANT
SYR 3ML LL SCALE MARK (SYRINGE) ×1 IMPLANT

## 2024-04-08 NOTE — H&P (Signed)
 [facility]   Primary Care Physician:  Clarise Crooks, MD Ophthalmologist: Dr. Dusty Gin  Pre-Procedure History & Physical: HPI:  Brenda Campbell is a 73 y.o. female here for cataract surgery.   Past Medical History:  Diagnosis Date   A-fib (HCC) 03/26/2023   cardiac appt scheduled   Anxiety    Arthritis    Blood transfusion without reported diagnosis    Cancer (HCC)    small bowel ca   Depression    GERD (gastroesophageal reflux disease)    Hyperlipidemia    Hypertension    Mild mitral regurgitation by prior echocardiogram    Mild mitral regurgitation by prior echocardiogram    Mild tricuspid regurgitation by prior echocardiogram    Thyroid  disease     Past Surgical History:  Procedure Laterality Date   CATARACT EXTRACTION W/PHACO Right 03/25/2024   Procedure: PHACOEMULSIFICATION, CATARACT, WITH IOL INSERTION 13.25 01:12.0;  Surgeon: Rosa College, MD;  Location: [facility];  Service: Ophthalmology;  Laterality: Right;   CESAREAN SECTION     x 1   CHOLECYSTECTOMY     COLONOSCOPY  10/12/2014   cleared for 3 years   COLONOSCOPY N/A 03/16/2017   Procedure: COLONOSCOPY;  Surgeon: Marnee Sink, MD;  Location: [facility] ENDOSCOPY;  Service: Endoscopy;  Laterality: N/A;   COLONOSCOPY WITH PROPOFOL  N/A 12/01/2017   Procedure: COLONOSCOPY WITH PROPOFOL ;  Surgeon: Luke Salaam, MD;  Location: [facility] ENDOSCOPY;  Service: Gastroenterology;  Laterality: N/A;   COLONOSCOPY WITH PROPOFOL  N/A 04/12/2021   Procedure: COLONOSCOPY WITH PROPOFOL ;  Surgeon: Luke Salaam, MD;  Location: [facility] ENDOSCOPY;  Service: Gastroenterology;  Laterality: N/A;   ESOPHAGOGASTRODUODENOSCOPY (EGD) WITH PROPOFOL  N/A 03/15/2017   Procedure: ESOPHAGOGASTRODUODENOSCOPY (EGD) WITH PROPOFOL ;  Surgeon: Luke Salaam, MD;  Location: [facility] ENDOSCOPY;  Service: Endoscopy;  Laterality: N/A;   ESOPHAGOGASTRODUODENOSCOPY (EGD) WITH PROPOFOL  N/A 03/30/2017   Procedure: ESOPHAGOGASTRODUODENOSCOPY (EGD)  WITH PROPOFOL ;  Surgeon: Luke Salaam, MD;  Location: [facility] ENDOSCOPY;  Service: Endoscopy;  Laterality: N/A;  Endoscopic Capsule Placement   ESOPHAGOGASTRODUODENOSCOPY (EGD) WITH PROPOFOL  N/A 12/01/2017   Procedure: ESOPHAGOGASTRODUODENOSCOPY (EGD) WITH PROPOFOL ;  Surgeon: Luke Salaam, MD;  Location: [facility] ENDOSCOPY;  Service: Gastroenterology;  Laterality: N/A;   ESOPHAGOGASTRODUODENOSCOPY (EGD) WITH PROPOFOL  N/A 04/12/2021   Procedure: ESOPHAGOGASTRODUODENOSCOPY (EGD) WITH PROPOFOL ;  Surgeon: Luke Salaam, MD;  Location: [facility] ENDOSCOPY;  Service: Gastroenterology;  Laterality: N/A;   FRACTURE SURGERY     GALLBLADDER SURGERY     GIVENS CAPSULE STUDY N/A 03/17/2017   Procedure: GIVENS CAPSULE STUDY;  Surgeon: Marnee Sink, MD;  Location: [facility] ENDOSCOPY;  Service: Endoscopy;  Laterality: N/A;   GIVENS CAPSULE STUDY N/A 12/01/2017   Procedure: GIVENS CAPSULE STUDY, 12 HOUR;  Surgeon: Luke Salaam, MD;  Location: [facility] ENDOSCOPY;  Service: Gastroenterology;  Laterality: N/A;   SMALL INTESTINE SURGERY     THROAT SURGERY     nodule on vocal chord   TIBIA FRACTURE SURGERY Right    WRIST SURGERY Right    arthritis    Prior to Admission medications   Medication Sig Start Date End Date Taking? Authorizing Provider  benzonatate  (TESSALON ) 100 MG capsule Take 1 capsule (100 mg total) by mouth 3 (three) times daily as needed for cough. 02/27/24  Yes Jones, Deanna C, MD  cetirizine (ZYRTEC) 10 MG tablet Take 10 mg by mouth daily as needed for allergies.   Yes [provider]  clotrimazole -betamethasone  (LOTRISONE ) cream Apply externally BID for 2 wks 02/27/24  Yes Clarise Crooks, MD  conjugated estrogens  (  PREMARIN ) vaginal cream Estrogen Cream Instruction Discard applicator Apply pea sized amount to tip of finger to urethra before bed. Wash hands well after application. Use Monday, Wednesday and Friday 08/13/21  Yes Dustin Gimenez, MD  Cyanocobalamin (VITAMIN B-12 SL) Place 2,000 mcg under the tongue  daily.   Yes [provider]  levothyroxine  (SYNTHROID ) 50 MCG tablet Take 1 tablet (50 mcg total) by mouth daily. 02/27/24  Yes Clarise Crooks, MD  magnesium gluconate (MAGONATE) 500 MG tablet Take 500 mg by mouth daily.   Yes [provider]  methenamine  (HIPREX ) 1 g tablet Take 1 tablet (1 g total) by mouth 2 (two) times daily with a meal. 07/19/23  Yes Vaillancourt, Samantha, PA-C  Probiotic Product (PROBIOTIC DAILY) CAPS Take 1 capsule by mouth daily.   Yes [provider]  rosuvastatin  (CRESTOR ) 5 MG tablet Take 1 tablet (5 mg total) by mouth daily. 02/27/24  Yes Clarise Crooks, MD  sertraline  (ZOLOFT ) 50 MG tablet Take 1 tablet (50 mg total) by mouth daily. 02/27/24  Yes Clarise Crooks, MD  UNABLE TO FIND Lion's Mane 1000mg  1 tablet daily   Yes [provider]  ipratropium (ATROVENT ) 0.06 % nasal spray Place 2 sprays into both nostrils 4 (four) times daily. 09/16/23   Kent Pear, NP  nitrofurantoin , macrocrystal-monohydrate, (MACROBID ) 100 MG capsule Take 1 capsule (100 mg total) by mouth at bedtime. 01/31/24   McGowan, Cathleen Coach A, PA-C  omeprazole  (PRILOSEC) 20 MG capsule TAKE 1 CAPSULE BY MOUTH TWICE A DAY 10/13/23   Clarise Crooks, MD  promethazine -dextromethorphan (PROMETHAZINE -DM) 6.25-15 MG/5ML syrup Take 5 mLs by mouth 4 (four) times daily as needed. 09/16/23   Kent Pear, NP    Allergies as of 02/27/2024   (No Known Allergies)    Family History  Problem Relation Age of Onset   Diabetes Mother    Heart disease Mother    Alzheimer's disease Mother    Other Father 58       "old age"   Diabetes Brother    Breast cancer Neg Hx     Social History   Socioeconomic History   Marital status: Married    Spouse name: Charles   Number of children: 1   Years of education: Not on file   Highest education level: Associate degree: occupational, Scientist, product/process development, or vocational program  Occupational History   Occupation: retired  Tobacco Use   Smoking  status: Former    Current packs/day: 0.00    Average packs/day: 1.5 packs/day for 27.0 years (40.5 ttl pk-yrs)    Types: Cigarettes    Start date: 63    Quit date: 1999    Years since quitting: 26.3   Smokeless tobacco: Never  Vaping Use   Vaping status: Never Used  Substance and Sexual Activity   Alcohol use: Yes    Alcohol/week: 20.0 standard drinks of alcohol    Types: 20 Shots of liquor per week    Comment: 3-4  cocktails 6 days per week   Drug use: No   Sexual activity: Not Currently  Other Topics Concern   Not on file  Social History Narrative   Retired, but substitute teaches occasionally. Married with 1 child and 1 step-child   Social Drivers of Corporate investment banker Strain: Low Risk  (04/02/2024)   Received from [facility]   Overall Financial Resource Strain (CARDIA)    Difficulty of Paying Living Expenses: Not hard at all  Food Insecurity: No  Food Insecurity (04/02/2024)   Received from [facility]   Hunger Vital Sign    Worried About Running Out of Food in the Last Year: Never true    Ran Out of Food in the Last Year: Never true  Transportation Needs: No Transportation Needs (04/02/2024)   Received from [facility] - Transportation    In the past 12 months, has lack of transportation kept you from medical appointments or from getting medications?: No    Lack of Transportation (Non-Medical): No  Physical Activity: Insufficiently Active (02/26/2024)   Exercise Vital Sign    Days of Exercise per Week: 1 day    Minutes of Exercise per Session: 30 min  Stress: Stress Concern Present (02/26/2024)   [employer] of Occupational Health - Occupational Stress Questionnaire    Feeling of Stress : To some extent  Social Connections: Moderately Integrated (02/26/2024)   Social Connection and Isolation Panel [NHANES]    Frequency of Communication with Friends and Family: More than three times a week     Frequency of Social Gatherings with Friends and Family: Once a week    Attends Religious Services: 1 to 4 times per year    Active Member of [employer] or Organizations: No    Attends Banker Meetings: Never    Marital Status: Married  Catering manager Violence: Not At Risk (11/06/2023)   Humiliation, Afraid, Rape, and Kick questionnaire    Fear of Current or Ex-Partner: No    Emotionally Abused: No    Physically Abused: No    Sexually Abused: No    Review of Systems: See HPI, otherwise negative ROS  Physical Exam: BP (!) 142/89   Pulse 99   Temp 98 F (36.7 C) (Temporal)   Resp 18   Ht 5\' 7"  (1.702 m)   Wt 105.7 kg   SpO2 98%   BMI 36.49 kg/m  General:   Alert, cooperative. Head:  Normocephalic and atraumatic. Respiratory:  Normal work of breathing. Cardiovascular:  NAD  Impression/Plan: Brenda Campbell is here for cataract surgery.  Risks, benefits, limitations, and alternatives regarding cataract surgery have been reviewed with the patient.  Questions have been answered.  All parties agreeable.   Dusty Gin, MD  04/08/2024, 11:58 AM

## 2024-04-08 NOTE — Op Note (Signed)
 OPERATIVE NOTE  [facility] [id number] 04/08/2024   PREOPERATIVE DIAGNOSIS:  Nuclear sclerotic cataract left eye.  H25.12   POSTOPERATIVE DIAGNOSIS:    Nuclear sclerotic cataract left eye.     PROCEDURE:  Phacoemusification with posterior chamber intraocular lens placement of the left eye   LENS:   Implant Name Type Inv. Item Serial No. Manufacturer Lot No. LRB No. Used Action  LENS CLAREON VIVITY 20.5 - [id number]  LENS CLAREON VIVITY 20.5 [id number] SIGHTPATH  Left 1 Implanted      Procedure(s): PHACOEMULSIFICATION, CATARACT, WITH IOL INSERTION 7.43 00:52.5 (Left)  SURGEON:  Dusty Gin, MD, MPH   ANESTHESIA:  Topical with tetracaine  drops augmented with 1% preservative-free intracameral lidocaine .  ESTIMATED BLOOD LOSS: <1 mL   COMPLICATIONS:  None.   DESCRIPTION OF PROCEDURE:  The patient was identified in the holding room and transported to the operating room and placed in the supine position under the operating microscope.  The left eye was identified as the operative eye and it was prepped and draped in the usual sterile ophthalmic fashion.   A 1.0 millimeter clear-corneal paracentesis was made at the 5:00 position. 0.5 ml of preservative-free 1% lidocaine  with epinephrine  was injected into the anterior chamber.  The anterior chamber was filled with viscoelastic.  A 2.4 millimeter keratome was used to make a near-clear corneal incision at the 2:00 position.  A curvilinear capsulorrhexis was made with a cystotome and capsulorrhexis forceps.  Balanced salt  solution was used to hydrodissect and hydrodelineate the nucleus.   Phacoemulsification was then used in stop and chop fashion to remove the lens nucleus and epinucleus.  The remaining cortex was then removed using the irrigation and aspiration handpiece. Viscoelastic was then placed into the capsular bag to distend it for lens placement.  A lens was then injected into the capsular bag.  The remaining viscoelastic  was aspirated.   Wounds were hydrated with balanced salt  solution.  The anterior chamber was inflated to a physiologic pressure with balanced salt  solution.  Intracameral vigamox  0.1 mL undiltued was injected into the eye and a drop placed onto the ocular surface.  No wound leaks were noted.  The patient was taken to the recovery room in stable condition without complications of anesthesia or surgery  Dusty Gin 04/08/2024, 12:23 PM

## 2024-04-08 NOTE — Anesthesia Postprocedure Evaluation (Signed)
 Anesthesia Post Note  Patient: Turkey Ward [place]  Procedure(s) Performed: PHACOEMULSIFICATION, CATARACT, WITH IOL INSERTION 7.43 00:52.5 (Left: Eye)  Patient location during evaluation: PACU Anesthesia Type: MAC Level of consciousness: awake and alert Pain management: pain level controlled Vital Signs Assessment: post-procedure vital signs reviewed and stable Respiratory status: spontaneous breathing, nonlabored ventilation, respiratory function stable and patient connected to nasal cannula oxygen Cardiovascular status: stable and blood pressure returned to baseline Postop Assessment: no apparent nausea or vomiting Anesthetic complications: no   No notable events documented.   Last Vitals:  Vitals:   04/08/24 1224 04/08/24 1231  BP: 122/85 (!) 131/98  Pulse: 95 68  Resp: 18 14  Temp: (!) 36.2 C (!) 36.2 C  SpO2: 95% 93%    Last Pain:  Vitals:   04/08/24 1231  TempSrc:   PainSc: 0-No pain                 Keonna Raether C Sharonne Ricketts

## 2024-04-08 NOTE — Transfer of Care (Signed)
 Immediate Anesthesia Transfer of Care Note  Patient: Turkey Ward [place]  Procedure(s) Performed: PHACOEMULSIFICATION, CATARACT, WITH IOL INSERTION 7.43 00:52.5 (Left: Eye)  Patient Location: PACU  Anesthesia Type: MAC  Level of Consciousness: awake, alert  and patient cooperative  Airway and Oxygen Therapy: Patient Spontanous Breathing and Patient connected to supplemental oxygen  Post-op Assessment: Post-op Vital signs reviewed, Patient's Cardiovascular Status Stable, Respiratory Function Stable, Patent Airway and No signs of Nausea or vomiting  Post-op Vital Signs: Reviewed and stable  Complications: No notable events documented.

## 2024-04-09 ENCOUNTER — Encounter: Payer: Self-pay | Admitting: Ophthalmology

## 2024-04-25 DIAGNOSIS — I480 Paroxysmal atrial fibrillation: Secondary | ICD-10-CM | POA: Diagnosis not present

## 2024-05-10 ENCOUNTER — Other Ambulatory Visit: Payer: Self-pay | Admitting: Family Medicine

## 2024-05-10 DIAGNOSIS — L292 Pruritus vulvae: Secondary | ICD-10-CM

## 2024-05-17 ENCOUNTER — Other Ambulatory Visit: Payer: Self-pay | Admitting: Urology

## 2024-05-17 DIAGNOSIS — N390 Urinary tract infection, site not specified: Secondary | ICD-10-CM

## 2024-05-17 MED ORDER — ESTRADIOL 0.1 MG/GM VA CREA: TOPICAL_CREAM | VAGINAL | 12 refills | Status: AC

## 2024-05-17 MED ORDER — PREMARIN 0.625 MG/GM VA CREA: TOPICAL_CREAM | VAGINAL | 12 refills | Status: DC

## 2024-06-06 DIAGNOSIS — I4819 Other persistent atrial fibrillation: Secondary | ICD-10-CM | POA: Diagnosis not present

## 2024-06-10 ENCOUNTER — Other Ambulatory Visit: Payer: Self-pay

## 2024-06-10 DIAGNOSIS — K2900 Acute gastritis without bleeding: Secondary | ICD-10-CM

## 2024-06-10 DIAGNOSIS — K219 Gastro-esophageal reflux disease without esophagitis: Secondary | ICD-10-CM

## 2024-06-10 MED ORDER — OMEPRAZOLE 20 MG PO CPDR: 20.0000 mg | DELAYED_RELEASE_CAPSULE | Freq: Two times a day (BID) | ORAL | 0 refills | Status: AC

## 2024-06-21 DIAGNOSIS — R5382 Chronic fatigue, unspecified: Secondary | ICD-10-CM | POA: Diagnosis not present

## 2024-06-21 DIAGNOSIS — E039 Hypothyroidism, unspecified: Secondary | ICD-10-CM | POA: Diagnosis not present

## 2024-06-21 DIAGNOSIS — Z1331 Encounter for screening for depression: Secondary | ICD-10-CM | POA: Diagnosis not present

## 2024-06-21 DIAGNOSIS — I4811 Longstanding persistent atrial fibrillation: Secondary | ICD-10-CM | POA: Diagnosis not present

## 2024-06-21 DIAGNOSIS — K219 Gastro-esophageal reflux disease without esophagitis: Secondary | ICD-10-CM | POA: Diagnosis not present

## 2024-06-21 DIAGNOSIS — M150 Primary generalized (osteo)arthritis: Secondary | ICD-10-CM | POA: Diagnosis not present
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
## 2024-06-21 NOTE — Patient Instructions (Addendum)
 Dear Brenda Campbell;  I want to taper off sertraline  and start you on Cymbalta to treat both mood, but also use the extra benefit of cymbalta for osteoarthritis   TAPER OFF The sertraline  as such>> - START  taking Sertraline , half tablet (25mg ) daily for 2 weeks - After that, START Taking half tablet every other day for another 2 weeks  - START Cymbalta, 1 tablet (30mg ) daily - After 30 days, notify me on my chart so I can send you the next higher dose of Cymbalta  - If you have any uncomfortable symptoms or side effects on this regimen, please notify me on my chart

## 2024-06-24 MED ORDER — SODIUM CHLORIDE 0.9 % IV SOLN: INTRAVENOUS | Status: DC

## 2024-06-25 ENCOUNTER — Encounter: Payer: Self-pay | Admitting: Cardiology

## 2024-06-25 ENCOUNTER — Encounter
Admission: RE | Disposition: A | Discharge status: Home / Self Care | Payer: Self-pay | Source: Home / Self Care | Attending: Cardiology

## 2024-06-25 ENCOUNTER — Ambulatory Visit
Admission: RE | Admit: 2024-06-25 | Discharge: 2024-06-25 | Disposition: A | Discharge status: Home / Self Care | Source: Home / Self Care

## 2024-06-25 ENCOUNTER — Ambulatory Visit: Admitting: Registered Nurse

## 2024-06-25 ENCOUNTER — Other Ambulatory Visit: Payer: Self-pay

## 2024-06-25 ENCOUNTER — Ambulatory Visit
Admission: RE | Admit: 2024-06-25 | Discharge: 2024-06-25 | Disposition: A | Discharge status: Home / Self Care | Attending: Cardiology | Admitting: Cardiology

## 2024-06-25 DIAGNOSIS — Z87891 Personal history of nicotine dependence: Secondary | ICD-10-CM | POA: Insufficient documentation

## 2024-06-25 DIAGNOSIS — I4819 Other persistent atrial fibrillation: Secondary | ICD-10-CM | POA: Insufficient documentation

## 2024-06-25 DIAGNOSIS — Z79899 Other long term (current) drug therapy: Secondary | ICD-10-CM | POA: Insufficient documentation

## 2024-06-25 DIAGNOSIS — F32A Depression, unspecified: Secondary | ICD-10-CM | POA: Insufficient documentation

## 2024-06-25 DIAGNOSIS — F419 Anxiety disorder, unspecified: Secondary | ICD-10-CM | POA: Insufficient documentation

## 2024-06-25 DIAGNOSIS — I340 Nonrheumatic mitral (valve) insufficiency: Secondary | ICD-10-CM | POA: Insufficient documentation

## 2024-06-25 DIAGNOSIS — Z7901 Long term (current) use of anticoagulants: Secondary | ICD-10-CM | POA: Insufficient documentation

## 2024-06-25 DIAGNOSIS — K219 Gastro-esophageal reflux disease without esophagitis: Secondary | ICD-10-CM | POA: Diagnosis not present

## 2024-06-25 DIAGNOSIS — E039 Hypothyroidism, unspecified: Secondary | ICD-10-CM | POA: Diagnosis not present

## 2024-06-25 DIAGNOSIS — E785 Hyperlipidemia, unspecified: Secondary | ICD-10-CM | POA: Diagnosis not present

## 2024-06-25 DIAGNOSIS — I4891 Unspecified atrial fibrillation: Secondary | ICD-10-CM | POA: Diagnosis not present

## 2024-06-25 HISTORY — PX: CARDIOVERSION: SHX1299

## 2024-06-25 HISTORY — PX: TEE WITHOUT CARDIOVERSION: SHX5443

## 2024-06-25 LAB — ECHO TEE

## 2024-06-25 SURGERY — ECHOCARDIOGRAM, TRANSESOPHAGEAL
Anesthesia: General

## 2024-06-25 MED ORDER — PROPOFOL 10 MG/ML IV BOLUS
INTRAVENOUS | Status: DC | PRN
  Administered 2024-06-25: 30 mg via INTRAVENOUS
  Administered 2024-06-25: 20 mg via INTRAVENOUS
  Administered 2024-06-25 (×3): 30 mg via INTRAVENOUS
  Administered 2024-06-25: 50 mg via INTRAVENOUS
  Administered 2024-06-25 (×2): 30 mg via INTRAVENOUS

## 2024-06-25 MED ORDER — BUTAMBEN-TETRACAINE-BENZOCAINE 2-2-14 % EX AERO
INHALATION_SPRAY | CUTANEOUS | Status: AC
  Filled 2024-06-25: qty 5

## 2024-06-25 MED ORDER — LIDOCAINE VISCOUS HCL 2 % MT SOLN
OROMUCOSAL | Status: AC
  Filled 2024-06-25: qty 15

## 2024-06-25 NOTE — Transfer of Care (Signed)
 Immediate Anesthesia Transfer of Care Note  Patient: Brenda Campbell  Procedure(s) Performed: ECHOCARDIOGRAM, TRANSESOPHAGEAL CARDIOVERSION  Patient Location: Cath Lab  Anesthesia Type:General  Level of Consciousness: awake, drowsy, and patient cooperative  Airway & Oxygen Therapy: Patient Spontanous Breathing and Patient connected to nasal cannula oxygen  Post-op Assessment: Report given to RN and Post -op Vital signs reviewed and stable  Post vital signs: Reviewed and stable  Last Vitals:  Vitals Value Taken Time  BP    Temp    Pulse 84 06/25/24 12:35  Resp 18 06/25/24 12:35  SpO2 92 % 06/25/24 12:35    Last Pain:  Vitals:   06/25/24 1113  TempSrc: Temporal  PainSc: 0-No pain         Complications: No notable events documented.

## 2024-06-25 NOTE — Procedures (Signed)
 Electrical Cardioversion Procedure Note  Indication: Atrial fibrillation  Procedure Details: Consent: Indication, Risk/benefits of procedure as well as the alternatives explained to patient and informed consent obtained. Time out performed. Verified patient identification, verified procedure, verified correct patient position, special equipment/implants available, medications/allergies/relevent history reviewed, required imaging and test results reviewed.  Deep sedation was provided by anesthesia with propofol . Patient was delivered with 200 Joules of electricity X 1 and 300 J X 1 with out success, continued to stay in atrial fibrillation. Patient tolerated the procedure well. No immediate complication noted.   Unsuccessful cardioversion  Brenda Paterson, MD [facility] Cardiology- [facility]

## 2024-06-25 NOTE — H&P (Signed)
 H&P:  History of Present Illness: Brenda Campbell is a 73 y.o.female patient who presented to her clinic for management of atrial fibrillation.  Established with me 05/2024. Has history of persistent atrial fibrillation. Noted to have atrial fibrillation during her cataract surgery 03/2024. Echocardiogram 04/2024 with normal biventricular systolic function, LVEF greater than 55%, mild mitral regurgitation, mild left atrial enlargement. A-fib noted throughout study.  Patient complains of fatigue. Has intermittent dizziness. Has baseline exertional dyspnea which is unchanged. No complaints of chest pain/pressure. Irregular rhythm on exam today.  Past Medical and Surgical History  Past Medical History No past medical history on file.  Past Surgical History She has no past surgical history on file.   Medications and Allergies  Current Medications  Current Outpatient Medications  Medication Sig Dispense Refill  ALPRAZolam  (XANAX ) 0.25 MG tablet Take by mouth.  conjugated estrogens  (PREMARIN ) 0.625 mg/gram vaginal cream Place 0.5 g vaginally twice a week  ipratropium (ATROVENT ) 0.06 % nasal spray Place 2 sprays into one nostril 4 (four) times daily  levothyroxine  (SYNTHROID ) 50 MCG tablet Take 50 mcg by mouth every morning before breakfast (0630)  magnesium gluconate (MAGONATE) 27.5 mg magne- sium (500 mg) tablet Take 500 mg by mouth once daily  methenamine  hippurate (HIPREX ) 1 gram tablet Take 1 g by mouth 2 (two) times daily  metoprolol SUCCinate (TOPROL-XL) 25 MG XL tablet Take 1 tablet (25 mg total) by mouth once daily as needed 30 tablet 11  nitrofurantoin , macrocrystal-monohydrate, (MACROBID ) 100 MG capsule Take 100 mg by mouth at bedtime  omeprazole  (PRILOSEC) 20 MG DR capsule Take 1 capsule by mouth 2 (two) times daily  rosuvastatin  (CRESTOR ) 5 MG tablet Take 5 mg by mouth once daily  sertraline  (ZOLOFT ) 50 MG tablet Take by mouth.  apixaban (ELIQUIS) 5 mg tablet Take 1 tablet (5 mg total)  by mouth every 12 (twelve) hours 60 tablet 5  benzonatate  (TESSALON ) 100 MG capsule Take 100 mg by mouth 3 (three) times daily as needed (Patient not taking: Reported on 06/06/2024)  diclofenac  (VOLTAREN ) 1 % topical gel Apply 2 g topically 4 (four) times daily (Patient not taking: Reported on 06/06/2024)  levothyroxine  (SYNTHROID , LEVOTHROID) 50 MCG tablet Take by mouth.  NON FORMULARY  ranitidine  (ZANTAC ) 75 MG tablet Take by mouth.  traMADoL  (ULTRAM ) 50 mg tablet Take 1-2 tablets (50-100 mg total) by mouth every 6 (six) hours as needed for up to 30 doses 30 tablet 0   No current facility-administered medications for this visit.   Allergies: Aspirin  Social and Family History  Social History reports that she has quit smoking. She does not have any smokeless tobacco history on file.  Family History No family history on file.  Review of Systems   Review of Systems: The patient denies chest pain, shortness of breath, orthopnea, paroxysmal nocturnal dyspnea, pedal edema, palpitations, heart racing, presyncope, syncope.   Physical Examination    Today's Vitals   06/25/24 1113  BP: 112/72  Pulse: 85  Resp: 18  Temp: 97.9 F (36.6 C)  TempSrc: Temporal  SpO2: 96%  Weight: 103.9 kg  Height: 5' 6 (1.676 m)  PainSc: 0-No pain   Body mass index is 36.96 kg/m. SABRA  HEENT: Pupils equally reactive to light and accomodation  Neck: Supple, no significant JVD Lungs: clear to auscultation bilaterally; no wheezes, rales, rhonchi Heart: Regular rate and rhythm. No murmur Extremities: no pedal edema  Assessment and Plan   73 y.o. female with  Persistent atrial fibrillation History of GI  bleed many years ago  Plan for TEE/CV today Continue anticoagulation  Brenda Siegfried, MD [facility] Cardiology- [facility]

## 2024-06-25 NOTE — Anesthesia Preprocedure Evaluation (Addendum)
 Anesthesia Evaluation  Patient identified by MRN, date of birth, ID band Patient awake    Reviewed: Allergy & Precautions, NPO status , Patient's Chart, lab work & pertinent test results  History of Anesthesia Complications Negative for: history of anesthetic complications  Airway Mallampati: IV   Neck ROM: Full    Dental  (+) Missing   Pulmonary former smoker (quit 1999)   Pulmonary exam normal breath sounds clear to auscultation       Cardiovascular + dysrhythmias (a fib on Eliquis)  Rhythm:Irregular Rate:Normal  Echo 04/25/24:  NORMAL LEFT VENTRICULAR SYSTOLIC FUNCTION WITH MILD LVH  ESTIMATED EF: >55%, CALC EF(2D): 57%  NORMAL LA PRESSURES WITH NORMAL DIASTOLIC FUNCTION  NORMAL RIGHT VENTRICULAR SYSTOLIC FUNCTION  VALVULAR REGURGITATION: No AR, MILD MR, No PR, TRIVIAL TR  NO VALVULAR STENOSIS  IRREGULAR HEART RHYTHM CAPTURED THROUGHOUT EXAM  GRADIENT MEASUREMENTS POSSIBLY UNDERVALUED DUE TO ARRHYTHMIA  STRESS ECHO DONE 12/2014 SHOWED MILD MR, TR: NO TTE FOR COMPARISON     Neuro/Psych  PSYCHIATRIC DISORDERS Anxiety Depression    negative neurological ROS     GI/Hepatic ,GERD  ,,  Endo/Other  Hypothyroidism  Obesity   Renal/GU negative Renal ROS     Musculoskeletal  (+) Arthritis ,    Abdominal   Peds  Hematology negative hematology ROS (+)   Anesthesia Other Findings   Reproductive/Obstetrics                              Anesthesia Physical Anesthesia Plan  ASA: 3  Anesthesia Plan: General   Post-op Pain Management:    Induction: Intravenous  PONV Risk Score and Plan: 3 and Propofol  infusion, TIVA and Treatment may vary due to age or medical condition  Airway Management Planned: Natural Airway  Additional Equipment:   Intra-op Plan:   Post-operative Plan:   Informed Consent: I have reviewed the patients History and Physical, chart, labs and discussed the procedure  including the risks, benefits and alternatives for the proposed anesthesia with the patient or authorized representative who has indicated his/her understanding and acceptance.       Plan Discussed with: CRNA  Anesthesia Plan Comments: (LMA/GETA backup discussed.  Patient consented for risks of anesthesia including but not limited to:  - adverse reactions to medications - damage to eyes, teeth, lips or other oral mucosa - nerve damage due to positioning  - sore throat or hoarseness - damage to heart, brain, nerves, lungs, other parts of body or loss of life  Informed patient about role of CRNA in peri- and intra-operative care.  Patient voiced understanding.)         Anesthesia Quick Evaluation

## 2024-06-25 NOTE — Progress Notes (Signed)
*  PRELIMINARY RESULTS* Echocardiogram Echocardiogram Transesophageal has been performed.  Floydene Harder 06/25/2024, 12:36 PM

## 2024-06-25 NOTE — Anesthesia Postprocedure Evaluation (Signed)
 Anesthesia Post Note  Patient: Turkey Ward Horrell  Procedure(s) Performed: ECHOCARDIOGRAM, TRANSESOPHAGEAL CARDIOVERSION  Patient location during evaluation: PACU Anesthesia Type: General Level of consciousness: awake and alert, oriented and patient cooperative Pain management: pain level controlled Vital Signs Assessment: post-procedure vital signs reviewed and stable Respiratory status: spontaneous breathing, nonlabored ventilation and respiratory function stable Cardiovascular status: blood pressure returned to baseline and stable Postop Assessment: adequate PO intake Anesthetic complications: no   No notable events documented.   Last Vitals:  Vitals:   06/25/24 1245 06/25/24 1300  BP: 104/77 113/78  Pulse: 79 77  Resp: 15 12  Temp:    SpO2: 95% 96%    Last Pain:  Vitals:   06/25/24 1300  TempSrc:   PainSc: 0-No pain                 Alfonso Ruths

## 2024-06-26 ENCOUNTER — Encounter: Payer: Self-pay | Admitting: Cardiology

## 2024-07-11 DIAGNOSIS — R000 Tachycardia, unspecified: Secondary | ICD-10-CM | POA: Diagnosis not present

## 2024-07-11 DIAGNOSIS — E782 Mixed hyperlipidemia: Secondary | ICD-10-CM | POA: Diagnosis not present

## 2024-07-11 DIAGNOSIS — I4819 Other persistent atrial fibrillation: Secondary | ICD-10-CM | POA: Diagnosis not present

## 2024-07-12 DIAGNOSIS — Z961 Presence of intraocular lens: Secondary | ICD-10-CM | POA: Diagnosis not present

## 2024-07-12 DIAGNOSIS — H579 Unspecified disorder of eye and adnexa: Secondary | ICD-10-CM | POA: Diagnosis not present

## 2024-07-12 DIAGNOSIS — H04123 Dry eye syndrome of bilateral lacrimal glands: Secondary | ICD-10-CM | POA: Diagnosis not present

## 2024-07-15 ENCOUNTER — Telehealth: Admitting: Physician Assistant

## 2024-07-15 DIAGNOSIS — N390 Urinary tract infection, site not specified: Secondary | ICD-10-CM

## 2024-07-15 MED ORDER — CIPROFLOXACIN HCL 250 MG PO TABS: 250.0000 mg | ORAL_TABLET | Freq: Two times a day (BID) | ORAL | 0 refills | Status: AC

## 2024-07-15 NOTE — Progress Notes (Signed)
 E-Visit for Urinary Problems  We are sorry that you are not feeling well.  Here is how we plan to help!  Based on what you shared with me it looks like you most likely have a simple urinary tract infection.  A UTI (Urinary Tract Infection) is a bacterial infection of the bladder.  Most cases of urinary tract infections are simple to treat but a key part of your care is to encourage you to drink plenty of fluids and watch your symptoms carefully.  I have prescribed Ciprofloxacin  250mg  Take 1 tablet twice daily for 5 days.  Your symptoms should gradually improve. Call us  if the burning in your urine worsens, you develop worsening fever, back pain or pelvic pain or if your symptoms do not resolve after completing the antibiotic.  Urinary tract infections can be prevented by drinking plenty of water to keep your body hydrated.  Also be sure when you wipe, wipe from front to back and don't hold it in!  If possible, empty your bladder every 4 hours.  HOME CARE Drink plenty of fluids Compete the full course of the antibiotics even if the symptoms resolve Remember, when you need to go.go. Holding in your urine can increase the likelihood of getting a UTI! GET HELP RIGHT AWAY IF: You cannot urinate You get a high fever Worsening back pain occurs You see blood in your urine You feel sick to your stomach or throw up You feel like you are going to pass out  MAKE SURE YOU  Understand these instructions. Will watch your condition. Will get help right away if you are not doing well or get worse.   Thank you for choosing an e-visit.  Your e-visit answers were reviewed by a board certified advanced clinical practitioner to complete your personal care plan. Depending upon the condition, your plan could have included both over the counter or prescription medications.  Please review your pharmacy choice. Make sure the pharmacy is open so you can pick up prescription now. If there is a problem, you  may contact your provider through [employer] and have the prescription routed to another pharmacy.  Your safety is important to us . If you have drug allergies check your prescription carefully.   For the next 24 hours you can use MyChart to ask questions about today's visit, request a non-urgent call back, or ask for a work or school excuse. You will get an email in the next two days asking about your experience. I hope that your e-visit has been valuable and will speed your recovery.    I have spent 5 minutes in review of e-visit questionnaire, review and updating patient chart, medical decision making and response to patient.   Delon CHRISTELLA Dickinson, PA-C

## 2024-07-16 ENCOUNTER — Other Ambulatory Visit: Payer: Self-pay | Admitting: Physician Assistant

## 2024-07-16 DIAGNOSIS — B3731 Acute candidiasis of vulva and vagina: Secondary | ICD-10-CM

## 2024-07-18 ENCOUNTER — Telehealth: Payer: Self-pay

## 2024-07-18 ENCOUNTER — Other Ambulatory Visit: Payer: Self-pay

## 2024-07-18 DIAGNOSIS — F330 Major depressive disorder, recurrent, mild: Secondary | ICD-10-CM

## 2024-07-18 MED ORDER — SERTRALINE HCL 50 MG PO TABS: 50.0000 mg | ORAL_TABLET | Freq: Every day | ORAL | 0 refills | Status: DC

## 2024-07-18 NOTE — Telephone Encounter (Signed)
 Please call pt to schedule a TOC to another provider.  KP

## 2024-07-18 NOTE — Telephone Encounter (Signed)
 Patient has new pcp at other facility.

## 2024-07-19 ENCOUNTER — Other Ambulatory Visit: Payer: Self-pay | Admitting: Physician Assistant

## 2024-07-19 DIAGNOSIS — N390 Urinary tract infection, site not specified: Secondary | ICD-10-CM

## 2024-07-22 ENCOUNTER — Ambulatory Visit: Admitting: Urology

## 2024-08-13 ENCOUNTER — Ambulatory Visit

## 2024-08-13 ENCOUNTER — Encounter: Payer: Self-pay | Admitting: Cardiology

## 2024-08-13 ENCOUNTER — Ambulatory Visit
Admission: RE | Admit: 2024-08-13 | Discharge: 2024-08-13 | Disposition: A | Discharge status: Home / Self Care | Attending: Cardiology | Admitting: Cardiology

## 2024-08-13 ENCOUNTER — Encounter
Admission: RE | Disposition: A | Discharge status: Home / Self Care | Payer: Self-pay | Source: Home / Self Care | Attending: Cardiology

## 2024-08-13 ENCOUNTER — Other Ambulatory Visit: Payer: Self-pay

## 2024-08-13 DIAGNOSIS — K219 Gastro-esophageal reflux disease without esophagitis: Secondary | ICD-10-CM | POA: Insufficient documentation

## 2024-08-13 DIAGNOSIS — F418 Other specified anxiety disorders: Secondary | ICD-10-CM | POA: Insufficient documentation

## 2024-08-13 DIAGNOSIS — E039 Hypothyroidism, unspecified: Secondary | ICD-10-CM | POA: Insufficient documentation

## 2024-08-13 DIAGNOSIS — Z7901 Long term (current) use of anticoagulants: Secondary | ICD-10-CM | POA: Insufficient documentation

## 2024-08-13 DIAGNOSIS — I4819 Other persistent atrial fibrillation: Secondary | ICD-10-CM | POA: Insufficient documentation

## 2024-08-13 DIAGNOSIS — Z87891 Personal history of nicotine dependence: Secondary | ICD-10-CM | POA: Insufficient documentation

## 2024-08-13 DIAGNOSIS — Z79899 Other long term (current) drug therapy: Secondary | ICD-10-CM | POA: Insufficient documentation

## 2024-08-13 DIAGNOSIS — I10 Essential (primary) hypertension: Secondary | ICD-10-CM | POA: Diagnosis not present

## 2024-08-13 DIAGNOSIS — I4891 Unspecified atrial fibrillation: Secondary | ICD-10-CM | POA: Diagnosis not present

## 2024-08-13 HISTORY — PX: CARDIOVERSION: SHX1299

## 2024-08-13 SURGERY — CARDIOVERSION
Anesthesia: General

## 2024-08-13 MED ORDER — OXYCODONE HCL 5 MG/5ML PO SOLN: 5.0000 mg | Freq: Once | ORAL | Status: DC | PRN

## 2024-08-13 MED ORDER — OXYCODONE HCL 5 MG PO TABS: 5.0000 mg | ORAL_TABLET | Freq: Once | ORAL | Status: DC | PRN

## 2024-08-13 MED ORDER — ACETAMINOPHEN 10 MG/ML IV SOLN: 1000.0000 mg | Freq: Once | INTRAVENOUS | Status: DC | PRN

## 2024-08-13 MED ORDER — PROPOFOL 10 MG/ML IV BOLUS
INTRAVENOUS | Status: DC | PRN
  Administered 2024-08-13: 50 mg via INTRAVENOUS

## 2024-08-13 MED ORDER — PROPOFOL 1000 MG/100ML IV EMUL
INTRAVENOUS | Status: AC
  Filled 2024-08-13: qty 100

## 2024-08-13 MED ORDER — FENTANYL CITRATE (PF) 100 MCG/2ML IJ SOLN: 25.0000 ug | INTRAMUSCULAR | Status: DC | PRN

## 2024-08-13 MED ORDER — SODIUM CHLORIDE 0.9 % IV SOLN: INTRAVENOUS | Status: DC

## 2024-08-13 MED ORDER — DROPERIDOL 2.5 MG/ML IJ SOLN: 0.6250 mg | Freq: Once | INTRAMUSCULAR | Status: DC | PRN

## 2024-08-13 NOTE — Procedures (Signed)
 Electrical Cardioversion Procedure Note  Indication: Atrial fibrillation  Procedure Details: Consent: Indication, Risk/benefits of procedure as well as the alternatives explained to patient and informed consent obtained. Time out performed. Verified patient identification, verified procedure, verified correct patient position, special equipment/implants available, medications/allergies/relevent history reviewed, required imaging and test results reviewed.  Deep sedation was provided by anesthesia with propofol. Patient was delivered with 200 Joules of electricity X 1 with success to Sinus rhythm. Patient tolerated the procedure well. No immediate complication noted.   Successful cardioversion  Windell Norfolk, MD [facility] Cardiology- [facility]

## 2024-08-13 NOTE — Transfer of Care (Signed)
 Immediate Anesthesia Transfer of Care Note  Patient: Brenda Campbell  Procedure(s) Performed: CARDIOVERSION  Patient Location: Cath Lab  Anesthesia Type:General  Level of Consciousness: drowsy  Airway & Oxygen Therapy: Patient Spontanous Breathing and Patient connected to nasal cannula oxygen  Post-op Assessment: Report given to RN  Post vital signs: stable  Last Vitals:  Vitals Value Taken Time  BP 98/56 08/13/24 12:15  Temp    Pulse 63 08/13/24 12:20  Resp 18 08/13/24 12:20  SpO2 94 % 08/13/24 12:20    Last Pain:  Vitals:   08/13/24 1132  TempSrc: Oral  PainSc: 0-No pain         Complications: No notable events documented.

## 2024-08-13 NOTE — H&P (Signed)
 H&P:  Ms. Brenda Campbell is a 73 y.o.female patient who presented to her clinic for management of atrial fibrillation.   Established with me 05/2024.  Has history of persistent atrial fibrillation.  Noted to have atrial fibrillation during her cataract surgery 03/2024.  Echocardiogram 04/2024 with normal biventricular systolic function, LVEF greater than 55%, mild mitral regurgitation, mild left atrial enlargement.  A-fib noted throughout study.  Had TEE 06/2024 which showed normal biventricular systolic function, LVEF 55 to 39%, mild mitral regurgitation, left atrium severely dilated.  No left atrial thrombus.  Unsuccessful cardioversion attempt.  Started on amiodarone   Patient complains of continued symptoms fatigue.Has baseline exertional dyspnea which is unchanged.  No complaints of chest pain/pressure.  EKG today showed atrial fibrillation with heart rate of 84 bpm   Past Medical and Surgical History  Past Medical History No past medical history on file.   Past Surgical History She has a past surgical history that includes Cesarean section; Cholecystectomy; Wrist Surgery (Right); and Fracture Repair (Right).    Medications and Allergies  Current Medications         Current Outpatient Medications  Medication Sig Dispense Refill   ALPRAZolam  (XANAX ) 0.25 MG tablet Take by mouth.       AMIOdarone (PACERONE) 200 MG tablet Take 1 tablet (200 mg total) by mouth once daily 30 tablet 11   apixaban (ELIQUIS) 5 mg tablet Take 1 tablet (5 mg total) by mouth every 12 (twelve) hours 60 tablet 5   carboxymethylcellulose (REFRESH TEARS) 0.5 % ophthalmic solution Apply 1 drop to eye as needed for Dry Eyes       conjugated estrogens  (PREMARIN ) 0.625 mg/gram vaginal cream Place 0.5 g vaginally twice a week       DULoxetine (CYMBALTA) 30 MG DR capsule Take 1 capsule (30 mg total) by mouth once daily for 30 days 30 capsule 0   ergocalciferol, vitamin D2, 1,250 mcg (50,000 unit) capsule Take 1 capsule (50,000  Units total) by mouth once a week for 360 days 12 capsule 3   ipratropium (ATROVENT ) 0.06 % nasal spray Place 2 sprays into one nostril 4 (four) times daily       Lactobacillus acidophilus (PROBIOTIC ORAL) Take by mouth       levothyroxine  (SYNTHROID ) 50 MCG tablet Take 50 mcg by mouth every morning before breakfast (0630)       magnesium gluconate (MAGONATE) 27.5 mg magne- sium (500 mg) tablet Take 500 mg by mouth once daily       methenamine  hippurate (HIPREX ) 1 gram tablet Take 1 g by mouth once daily       metoprolol SUCCinate (TOPROL-XL) 25 MG XL tablet Take 1 tablet (25 mg total) by mouth once daily       omeprazole  (PRILOSEC) 20 MG DR capsule Take 1 capsule by mouth 2 (two) times daily       rosuvastatin  (CRESTOR ) 5 MG tablet Take 5 mg by mouth once daily       sertraline  (ZOLOFT ) 50 MG tablet Take by mouth.       ZINC ORAL Take by mouth       levothyroxine  (SYNTHROID , LEVOTHROID) 50 MCG tablet Take by mouth.        No current facility-administered medications for this visit.      Allergies: Aspirin   Social and Family History  Social History  reports that she has quit smoking. She has never used smokeless tobacco.   Family History       Family History  Problem Relation Name Age of Onset   Diabetes type II Mother       Atrial fibrillation (Abnormal heart rhythm sometimes requiring treatment with blood thinners) Mother       No Known Problems Father       Atrial fibrillation (Abnormal heart rhythm sometimes requiring treatment with blood thinners) Brother          Review of Systems    Review of Systems: The patient denies chest pain, shortness of breath, orthopnea, paroxysmal nocturnal dyspnea, pedal edema, palpitations, heart racing, presyncope, syncope.    Physical Examination    Vitals:BP 122/76   Pulse 89   Ht 167.6 cm (5' 6)   Wt (!) 103.4 kg (228 lb)   SpO2 98%   BMI 36.80 kg/m  Ht:167.6 cm (5' 6) Wt:(!) 103.4 kg (228 lb) ADJ:Anib surface area is 2.19 meters  squared. Body mass index is 36.8 kg/m.   HEENT: Pupils equally reactive to light and accomodation    Neck: Supple, no significant JVD Lungs: clear to auscultation bilaterally; no wheezes, rales, rhonchi Heart: Regular rate and rhythm. No murmur Extremities: no pedal edema   Assessment and Plan    73 y.o. female with  Persistent atrial fibrillation TEE with unsuccessful cardioversion 06/2024 Left atrium severely dilated History of GI bleed many years ago   Will proceed with cardioversion today Continue amiodarone Continue Eliquis for anticoagulation  Keller Paterson, MD [facility] Cardiology- [facility]

## 2024-08-13 NOTE — Anesthesia Preprocedure Evaluation (Signed)
 Anesthesia Evaluation  Patient identified by MRN, date of birth, ID band Patient awake    Reviewed: Allergy & Precautions, H&P , NPO status , Patient's Chart, lab work & pertinent test results, reviewed documented beta blocker date and time   Airway Mallampati: II   Neck ROM: full    Dental  (+) Poor Dentition   Pulmonary neg pulmonary ROS, former smoker   Pulmonary exam normal        Cardiovascular Exercise Tolerance: Poor hypertension, On Medications negative cardio ROS Atrial Fibrillation  Rhythm:regular Rate:Normal     Neuro/Psych   Anxiety Depression    negative neurological ROS  negative psych ROS   GI/Hepatic Neg liver ROS,GERD  Medicated,,  Endo/Other  Hypothyroidism    Renal/GU negative Renal ROS  negative genitourinary   Musculoskeletal   Abdominal   Peds  Hematology negative hematology ROS (+)   Anesthesia Other Findings Past Medical History: 03/26/2023: A-fib [facility])     Comment:  cardiac appt scheduled No date: Anxiety No date: Arthritis No date: Blood transfusion without reported diagnosis No date: Cancer [facility])     Comment:  small bowel ca No date: Depression No date: GERD (gastroesophageal reflux disease) No date: Hyperlipidemia No date: Hypertension No date: Mild mitral regurgitation by prior echocardiogram No date: Mild mitral regurgitation by prior echocardiogram No date: Mild tricuspid regurgitation by prior echocardiogram No date: Thyroid  disease Past Surgical History: 06/25/2024: CARDIOVERSION; N/A     Comment:  Procedure: CARDIOVERSION;  Surgeon: Wilburn Keller BROCKS,               MD;  Location: [facility] ORS;  Service: Cardiovascular;                Laterality: N/A; 03/25/2024: CATARACT EXTRACTION W/PHACO; Right     Comment:  Procedure: PHACOEMULSIFICATION, CATARACT, WITH IOL               INSERTION 13.25 01:12.0;  Surgeon: Myrna Adine Anes,               MD;  Location: [facility];  Service:               Ophthalmology;  Laterality: Right; 04/08/2024: CATARACT EXTRACTION W/PHACO; Left     Comment:  Procedure: PHACOEMULSIFICATION, CATARACT, WITH IOL               INSERTION 7.43 00:52.5;  Surgeon: Myrna Adine Anes, MD;              Location: [facility];  Service: Ophthalmology;                Laterality: Left; No date: CESAREAN SECTION     Comment:  x 1 No date: CHOLECYSTECTOMY 10/12/2014: COLONOSCOPY     Comment:  cleared for 3 years 03/16/2017: COLONOSCOPY; N/A     Comment:  Procedure: COLONOSCOPY;  Surgeon: Rogelia Copping, MD;                Location: [facility] ENDOSCOPY;  Service: Endoscopy;                Laterality: N/A; 12/01/2017: COLONOSCOPY WITH PROPOFOL ; N/A     Comment:  Procedure: COLONOSCOPY WITH PROPOFOL ;  Surgeon: Therisa Bi, MD;  Location: [facility] ENDOSCOPY;  Service:               Gastroenterology;  Laterality: N/A; 04/12/2021: COLONOSCOPY WITH PROPOFOL ; N/A  Comment:  Procedure: COLONOSCOPY WITH PROPOFOL ;  Surgeon: Therisa Bi, MD;  Location: [facility] ENDOSCOPY;  Service:               Gastroenterology;  Laterality: N/A; 03/15/2017: ESOPHAGOGASTRODUODENOSCOPY (EGD) WITH PROPOFOL ; N/A     Comment:  Procedure: ESOPHAGOGASTRODUODENOSCOPY (EGD) WITH               PROPOFOL ;  Surgeon: Bi Therisa, MD;  Location: [facility]               ENDOSCOPY;  Service: Endoscopy;  Laterality: N/A; 03/30/2017: ESOPHAGOGASTRODUODENOSCOPY (EGD) WITH PROPOFOL ; N/A     Comment:  Procedure: ESOPHAGOGASTRODUODENOSCOPY (EGD) WITH               PROPOFOL ;  Surgeon: Bi Therisa, MD;  Location: [facility]               ENDOSCOPY;  Service: Endoscopy;  Laterality: N/A;                Endoscopic Capsule Placement 12/01/2017: ESOPHAGOGASTRODUODENOSCOPY (EGD) WITH PROPOFOL ; N/A     Comment:  Procedure: ESOPHAGOGASTRODUODENOSCOPY (EGD) WITH               PROPOFOL ;  Surgeon: Therisa Bi, MD;  Location: [facility]               ENDOSCOPY;  Service:  Gastroenterology;  Laterality: N/A; 04/12/2021: ESOPHAGOGASTRODUODENOSCOPY (EGD) WITH PROPOFOL ; N/A     Comment:  Procedure: ESOPHAGOGASTRODUODENOSCOPY (EGD) WITH               PROPOFOL ;  Surgeon: Therisa Bi, MD;  Location: [facility]               ENDOSCOPY;  Service: Gastroenterology;  Laterality: N/A; No date: FRACTURE SURGERY No date: GALLBLADDER SURGERY 03/17/2017: GIVENS CAPSULE STUDY; N/A     Comment:  Procedure: GIVENS CAPSULE STUDY;  Surgeon: Rogelia Copping,               MD;  Location: [facility] ENDOSCOPY;  Service: Endoscopy;                Laterality: N/A; 12/01/2017: GIVENS CAPSULE STUDY; N/A     Comment:  Procedure: GIVENS CAPSULE STUDY, 12 HOUR;  Surgeon:               Therisa Bi, MD;  Location: [facility] ENDOSCOPY;  Service:               Gastroenterology;  Laterality: N/A; No date: SMALL INTESTINE SURGERY 06/25/2024: TEE WITHOUT CARDIOVERSION; N/A     Comment:  Procedure: ECHOCARDIOGRAM, TRANSESOPHAGEAL;  Surgeon:               Alluri, Keller BROCKS, MD;  Location: [facility] ORS;  Service:               Cardiovascular;  Laterality: N/A; No date: THROAT SURGERY     Comment:  nodule on vocal chord No date: TIBIA FRACTURE SURGERY; Right No date: WRIST SURGERY; Right     Comment:  arthritis   Reproductive/Obstetrics negative OB ROS                              Anesthesia Physical Anesthesia Plan  ASA: 3  Anesthesia Plan: General   Post-op Pain Management:    Induction:   PONV Risk Score and Plan:   Airway Management Planned:   Additional Equipment:  Intra-op Plan:   Post-operative Plan:   Informed Consent: I have reviewed the patients History and Physical, chart, labs and discussed the procedure including the risks, benefits and alternatives for the proposed anesthesia with the patient or authorized representative who has indicated his/her understanding and acceptance.     Dental Advisory Given  Plan Discussed with: CRNA  Anesthesia Plan Comments:          Anesthesia Quick Evaluation

## 2024-08-14 ENCOUNTER — Encounter: Payer: Self-pay | Admitting: Cardiology

## 2024-08-14 NOTE — Anesthesia Postprocedure Evaluation (Signed)
 Anesthesia Post Note  Patient: Brenda Campbell  Procedure(s) Performed: CARDIOVERSION  Patient location during evaluation: PACU Anesthesia Type: General Level of consciousness: awake and alert Pain management: pain level controlled Vital Signs Assessment: post-procedure vital signs reviewed and stable Respiratory status: spontaneous breathing, nonlabored ventilation, respiratory function stable and patient connected to nasal cannula oxygen Cardiovascular status: blood pressure returned to baseline and stable Postop Assessment: no apparent nausea or vomiting Anesthetic complications: no   No notable events documented.   Last Vitals:  Vitals:   08/13/24 1315 08/13/24 1320  BP: 95/80 96/75  Pulse: 64 65  Resp: 18 17  Temp: 36.4 C   SpO2: 98% 98%    Last Pain:  Vitals:   08/13/24 1320  TempSrc:   PainSc: 0-No pain                 Lynwood KANDICE Clause

## 2024-08-19 ENCOUNTER — Telehealth: Admitting: Physician Assistant

## 2024-08-19 DIAGNOSIS — N390 Urinary tract infection, site not specified: Secondary | ICD-10-CM

## 2024-08-19 NOTE — Progress Notes (Signed)
  Because of having a UTI last month and having recurring symptoms, I feel your condition warrants further evaluation and I recommend that you be seen in a face-to-face visit to have a new urine culture obtained.   NOTE: There will be NO CHARGE for this E-Visit   If you are having a true medical emergency, please call 911.     For an urgent face to face visit, [place] has multiple urgent care centers for your convenience.  Click the link below for the full list of locations and hours, walk-in wait times, appointment scheduling options and driving directions:  Urgent Care - [place], [place], [place], [place], [place], [place]  [place]     Your MyChart E-visit questionnaire answers were reviewed by a board certified advanced clinical practitioner to complete your personal care plan based on your specific symptoms.    Thank you for using e-Visits.      I have spent 5 minutes in review of e-visit questionnaire, review and updating patient chart, medical decision making and response to patient.   Delon CHRISTELLA Dickinson, PA-C

## 2024-08-20 ENCOUNTER — Telehealth: Admitting: Physician Assistant

## 2024-08-20 DIAGNOSIS — J069 Acute upper respiratory infection, unspecified: Secondary | ICD-10-CM

## 2024-08-20 MED ORDER — BENZONATATE 100 MG PO CAPS: 100.0000 mg | ORAL_CAPSULE | Freq: Three times a day (TID) | ORAL | 0 refills | Status: DC | PRN

## 2024-08-20 NOTE — Progress Notes (Signed)

## 2024-08-20 NOTE — Progress Notes (Signed)
 I have spent 5 minutes in review of e-visit questionnaire, review and updating patient chart, medical decision making and response to patient.   Elsie Velma Lunger, PA-C

## 2024-08-21 ENCOUNTER — Ambulatory Visit
Admission: RE | Admit: 2024-08-21 | Discharge: 2024-08-21 | Disposition: A | Discharge status: Home / Self Care | Source: Ambulatory Visit | Attending: Emergency Medicine | Admitting: Emergency Medicine

## 2024-08-21 VITALS — BP 120/82 | HR 91 | Temp 98.7°F | Resp 16 | Wt 225.0 lb

## 2024-08-21 DIAGNOSIS — N390 Urinary tract infection, site not specified: Secondary | ICD-10-CM | POA: Diagnosis not present

## 2024-08-21 DIAGNOSIS — U071 COVID-19: Secondary | ICD-10-CM

## 2024-08-21 DIAGNOSIS — J069 Acute upper respiratory infection, unspecified: Secondary | ICD-10-CM

## 2024-08-21 LAB — URINALYSIS, W/ REFLEX TO CULTURE (INFECTION SUSPECTED)
Glucose, UA: NEGATIVE mg/dL
Ketones, ur: NEGATIVE mg/dL
Nitrite: NEGATIVE
Specific Gravity, Urine: 1.015 (ref 1.005–1.030)
WBC, UA: >50 WBC/hpf (ref 0–5)
pH: 6 (ref 5.0–8.0)

## 2024-08-21 LAB — SARS CORONAVIRUS 2 BY RT PCR: SARS Coronavirus 2 by RT PCR: POSITIVE — AB

## 2024-08-21 LAB — GROUP A STREP BY PCR: Group A Strep by PCR: NOT DETECTED

## 2024-08-21 MED ORDER — BENZONATATE 100 MG PO CAPS: 100.0000 mg | ORAL_CAPSULE | Freq: Three times a day (TID) | ORAL | 0 refills | Status: AC | PRN

## 2024-08-21 MED ORDER — IPRATROPIUM BROMIDE 0.06 % NA SOLN: 2.0000 | Freq: Four times a day (QID) | NASAL | 12 refills | Status: AC

## 2024-08-21 MED ORDER — PROMETHAZINE-DM 6.25-15 MG/5ML PO SYRP: 5.0000 mL | ORAL_SOLUTION | Freq: Four times a day (QID) | ORAL | 0 refills | Status: AC | PRN

## 2024-08-21 MED ORDER — NITROFURANTOIN MONOHYD MACRO 100 MG PO CAPS: 100.0000 mg | ORAL_CAPSULE | Freq: Two times a day (BID) | ORAL | 0 refills | Status: DC

## 2024-08-21 MED ORDER — PHENAZOPYRIDINE HCL 200 MG PO TABS: 200.0000 mg | ORAL_TABLET | Freq: Three times a day (TID) | ORAL | 0 refills | Status: AC

## 2024-08-21 NOTE — Discharge Instructions (Addendum)
 Your testing today was positive for COVID-19 and a urinary tract infection and.Take the Macrobid  twice daily for 5 days with food for treatment of urinary tract infection.  Hold your Hiprex  while you are taking the Macrobid  for your UTI.  Use the Pyridium  every 8 hours as needed for urinary discomfort.  This will turn your urine a bright red-orange.  Increase your oral fluid intake so that you increase your urine production and or flushing your urinary system.  Take an over-the-counter probiotic, such as Culturelle-Align-Activia, 1 hour after each dose of antibiotic to prevent diarrhea or yeast infections from forming.  We will culture urine and change the antibiotics if necessary.  CDC guidelines state that you must wear a mask for the first 5 days of symptoms when you are around other people.  After 5 days you no longer need to mask as you are no longer considered infectious.  There is no longer need to quarantine unless you have a fever.  If you do have a fever then you need to quarantine until you have been fever free for 24 hours without taking Tylenol  and/or ibuprofen.  Use over-the-counter Tylenol  and/or ibuprofen according to the package instructions as needed for fever and pain.  Use the Atrovent  nasal spray, 2 squirts up each nostril every 6 hours, as needed for nasal congestion and runny nose.  Use the Tessalon  Perles every 8 hours during the day as needed for cough.  Take them with a small sip of water.  You may experience numbness to the base of your tongue or metallic taste in her mouth, this is normal.  Use the Promethazine  DM cough syrup at bedtime as needed for cough and congestion.  Be mindful this medication will make you sleepy.  If you develop any worsening respiratory symptoms such as shortness of breath, shortness of breath at rest, feel as though you cannot catch your breath, you are unable to speak in full sentences, or, as a late sign, your lips begin turning blue you  need to call 911 and go to the ER for evaluation.

## 2024-08-21 NOTE — ED Provider Notes (Signed)
 [facility]    CSN: [id number] Arrival date & time: 08/21/24  1348      History   Chief Complaint Chief Complaint  Patient presents with   Nasal Congestion    Entered by patient   Sore Throat   Ear Fullness   Cough   Dysuria    HPI Brenda Campbell is a 73 y.o. female.   HPI  73 year old female with past medical history significant for recurrent UTIs, mild recurrent MDD, paroxysmal SVT, mixed hyperlipidemia, hypothyroidism, GERD, and atrial fibrillation status post cardioversion presents for evaluation of respiratory symptoms that began 3 days ago and painful urination with urgency and frequency that began 2 days ago.  She reports that for the first 2 days she had a subjective fever but she did not measure it.  Her nasal discharge is clear.  Her cough is nonproductive.  No shortness of breath or wheezing.  No blood in the urine.  Past Medical History:  Diagnosis Date   A-fib (HCC) 03/26/2023   cardiac appt scheduled   Anxiety    Arthritis    Blood transfusion without reported diagnosis    Cancer (HCC)    small bowel ca   Depression    GERD (gastroesophageal reflux disease)    Hyperlipidemia    Hypertension    Mild mitral regurgitation by prior echocardiogram    Mild mitral regurgitation by prior echocardiogram    Mild tricuspid regurgitation by prior echocardiogram    Thyroid  disease     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   Gastroesophageal reflux disease 12/22/2014    Hypothyroidism 12/22/2014   Obesity 12/22/2014   Tachycardia 12/22/2014   Malaise 12/22/2014    Past Surgical History:  Procedure Laterality Date   CARDIOVERSION N/A 06/25/2024   Procedure: CARDIOVERSION;  Surgeon: Wilburn Keller BROCKS, MD;  Location: [facility] ORS;  Service: Cardiovascular;  Laterality: N/A;   CARDIOVERSION N/A 08/13/2024   Procedure: CARDIOVERSION;  Surgeon: Wilburn Keller BROCKS, MD;  Location: [facility];  Service: Cardiovascular;  Laterality: N/A;   CATARACT EXTRACTION W/PHACO Right 03/25/2024   Procedure: PHACOEMULSIFICATION, CATARACT, WITH IOL INSERTION 13.25 01:12.0;  Surgeon: Myrna Adine Anes, MD;  Location: [facility];  Service: Ophthalmology;  Laterality: Right;   CATARACT EXTRACTION W/PHACO Left 04/08/2024   Procedure: PHACOEMULSIFICATION, CATARACT, WITH IOL INSERTION 7.43 00:52.5;  Surgeon: Myrna Adine Anes, MD;  Location: [facility];  Service: Ophthalmology;  Laterality: Left;   CESAREAN SECTION     x 1   CHOLECYSTECTOMY     COLONOSCOPY  10/12/2014   cleared for 3 years   COLONOSCOPY N/A 03/16/2017   Procedure: COLONOSCOPY;  Surgeon: Rogelia Copping, MD;  Location: [facility] ENDOSCOPY;  Service: Endoscopy;  Laterality: N/A;   COLONOSCOPY WITH PROPOFOL  N/A 12/01/2017   Procedure: COLONOSCOPY WITH PROPOFOL ;  Surgeon: Therisa Bi, MD;  Location: [facility] ENDOSCOPY;  Service: Gastroenterology;  Laterality: N/A;   COLONOSCOPY WITH PROPOFOL  N/A 04/12/2021   Procedure: COLONOSCOPY WITH PROPOFOL ;  Surgeon: Therisa Bi, MD;  Location: [facility] ENDOSCOPY;  Service: Gastroenterology;  Laterality: N/A;  ESOPHAGOGASTRODUODENOSCOPY (EGD) WITH PROPOFOL  N/A 03/15/2017   Procedure: ESOPHAGOGASTRODUODENOSCOPY (EGD) WITH PROPOFOL ;  Surgeon: Ruel Kung, MD;  Location: [facility] ENDOSCOPY;  Service: Endoscopy;  Laterality: N/A;   ESOPHAGOGASTRODUODENOSCOPY (EGD) WITH PROPOFOL  N/A 03/30/2017   Procedure: ESOPHAGOGASTRODUODENOSCOPY (EGD) WITH PROPOFOL ;  Surgeon: Ruel Kung, MD;  Location: [facility]  ENDOSCOPY;  Service: Endoscopy;  Laterality: N/A;  Endoscopic Capsule Placement   ESOPHAGOGASTRODUODENOSCOPY (EGD) WITH PROPOFOL  N/A 12/01/2017   Procedure: ESOPHAGOGASTRODUODENOSCOPY (EGD) WITH PROPOFOL ;  Surgeon: Kung Ruel, MD;  Location: [facility] ENDOSCOPY;  Service: Gastroenterology;  Laterality: N/A;   ESOPHAGOGASTRODUODENOSCOPY (EGD) WITH PROPOFOL  N/A 04/12/2021   Procedure: ESOPHAGOGASTRODUODENOSCOPY (EGD) WITH PROPOFOL ;  Surgeon: Kung Ruel, MD;  Location: [facility] ENDOSCOPY;  Service: Gastroenterology;  Laterality: N/A;   FRACTURE SURGERY     GALLBLADDER SURGERY     GIVENS CAPSULE STUDY N/A 03/17/2017   Procedure: GIVENS CAPSULE STUDY;  Surgeon: Rogelia Copping, MD;  Location: [facility] ENDOSCOPY;  Service: Endoscopy;  Laterality: N/A;   GIVENS CAPSULE STUDY N/A 12/01/2017   Procedure: GIVENS CAPSULE STUDY, 12 HOUR;  Surgeon: Kung Ruel, MD;  Location: [facility] ENDOSCOPY;  Service: Gastroenterology;  Laterality: N/A;   SMALL INTESTINE SURGERY     TEE WITHOUT CARDIOVERSION N/A 06/25/2024   Procedure: ECHOCARDIOGRAM, TRANSESOPHAGEAL;  Surgeon: Alluri, Keller BROCKS, MD;  Location: [facility] ORS;  Service: Cardiovascular;  Laterality: N/A;   THROAT SURGERY     nodule on vocal chord   TIBIA FRACTURE SURGERY Right    WRIST SURGERY Right    arthritis    OB History   No obstetric history on file.      Home Medications    Prior to Admission medications   Medication Sig Start Date End Date Taking? Authorizing Provider  ipratropium (ATROVENT ) 0.06 % nasal spray Place 2 sprays into both nostrils 4 (four) times daily. 08/21/24  Yes Bernardino Ditch, NP  nitrofurantoin , macrocrystal-monohydrate, (MACROBID ) 100 MG capsule Take 1 capsule (100 mg total) by mouth 2 (two) times daily. 08/21/24  Yes Bernardino Ditch, NP  phenazopyridine  (PYRIDIUM ) 200 MG tablet Take 1 tablet (200 mg total) by mouth 3 (three) times daily. 08/21/24  Yes Bernardino Ditch, NP  promethazine -dextromethorphan (PROMETHAZINE -DM) 6.25-15 MG/5ML syrup Take 5  mLs by mouth 4 (four) times daily as needed. 08/21/24  Yes Bernardino Ditch, NP  amiodarone (PACERONE) 200 MG tablet Take 200 mg by mouth daily. 07/11/24 07/11/25  [provider]  apixaban (ELIQUIS) 5 MG TABS tablet Take 5 mg by mouth 2 (two) times daily.    [provider]  benzonatate  (TESSALON ) 100 MG capsule Take 1 capsule (100 mg total) by mouth 3 (three) times daily as needed for cough. 08/21/24   Bernardino Ditch, NP  carboxymethylcellulose (REFRESH PLUS) 0.5 % SOLN Place 1 drop into both eyes 3 (three) times daily as needed (dry/irritated eyes).    [provider]  cetirizine (ZYRTEC) 10 MG tablet Take 10 mg by mouth daily as needed for allergies.    [provider]  clotrimazole -betamethasone  (LOTRISONE ) cream APPLY EXTERNALLY TWICE DAILY FOR 2 WKS Patient taking differently: Apply 1 Application topically daily as needed (itching/irritation). 05/10/24   Joshua Cathryne BROCKS, MD  conjugated estrogens  (PREMARIN ) vaginal cream Estrogen Cream Instruction Discard applicator Apply pea sized amount to tip of finger to urethra before bed. Wash hands well after application. Use Monday, Wednesday and Friday Patient not taking: Reported on 08/08/2024 05/17/24   Helon Kirsch A, PA-C  DULoxetine (CYMBALTA) 30 MG capsule Take 30 mg by mouth daily. 07/15/24   [provider]  estradiol  (ESTRACE  VAGINAL) 0.1 MG/GM vaginal cream Apply 0.5mg  (pea-sized amount)  just inside the vaginal introitus with a finger-tip on Monday, Wednesday and Friday nights. 05/17/24   Helon Kirsch A, PA-C  levothyroxine  (SYNTHROID ) 50 MCG tablet Take 1 tablet (50 mcg total) by mouth daily. 02/27/24   Joshua Cathryne BROCKS, MD  magnesium gluconate (MAGONATE) 500 MG tablet Take 500 mg by mouth daily.    [provider]  methenamine  (HIPREX ) 1 g tablet TAKE 1 TABLET (1 G TOTAL) BY MOUTH 2 (TWO) TIMES DAILY WITH A MEAL. Patient taking differently: Take 1 g by mouth daily. 07/22/24   Vaillancourt, Samantha,  PA-C  metoprolol succinate (TOPROL-XL) 25 MG 24 hr tablet Take 25 mg by mouth daily as needed (tachycardia). 04/02/24 04/02/25  [provider]  omeprazole  (PRILOSEC) 20 MG capsule Take 1 capsule (20 mg total) by mouth 2 (two) times daily. Patient taking differently: Take 20 mg by mouth every other day. 06/10/24   Lemon Raisin, MD  Probiotic Product (PROBIOTIC DAILY) CAPS Take 1 capsule by mouth daily.    [provider]  rosuvastatin  (CRESTOR ) 5 MG tablet Take 1 tablet (5 mg total) by mouth daily. 02/27/24   Joshua Cathryne BROCKS, MD    Family History Family History  Problem Relation Age of Onset   Diabetes Mother    Heart disease Mother    Alzheimer's disease Mother    Other Father 77       old age   Diabetes Brother    Breast cancer Neg Hx     Social History Social History   Tobacco Use   Smoking status: Former    Current packs/day: 0.00    Average packs/day: 1.5 packs/day for 27.0 years (40.5 ttl pk-yrs)    Types: Cigarettes    Start date: 60    Quit date: 1999    Years since quitting: 26.7   Smokeless tobacco: Never  Vaping Use   Vaping status: Never Used  Substance Use Topics   Alcohol use: Yes    Alcohol/week: 20.0 standard drinks of alcohol    Types: 20 Shots of liquor per week    Comment: 3-4  cocktails 6 days per week   Drug use: No     Allergies   Aspirin   Review of Systems Review of Systems  Constitutional:  Positive for fever.  HENT:  Positive for congestion, ear pain, rhinorrhea and sore throat.   Respiratory:  Positive for cough. Negative for shortness of breath and wheezing.   Gastrointestinal:  Negative for nausea and vomiting.  Genitourinary:  Positive for dysuria, frequency and urgency. Negative for hematuria.  Musculoskeletal:  Negative for back pain.     Physical Exam Triage Vital Signs ED Triage Vitals  Encounter Vitals Group     BP      Girls Systolic BP Percentile      Girls Diastolic BP Percentile      Boys  Systolic BP Percentile      Boys Diastolic BP Percentile      Pulse      Resp      Temp      Temp src      SpO2      Weight      Height      Head Circumference      Peak Flow      Pain Score      Pain Loc      Pain Education      Exclude  from Growth Chart    No data found.  Updated Vital Signs BP 120/82   Pulse 91   Temp 98.7 F (37.1 C) (Oral)   Resp 16   Wt 225 lb (102.1 kg)   SpO2 98%   BMI 36.32 kg/m   Visual Acuity Right Eye Distance:   Left Eye Distance:   Bilateral Distance:    Right Eye Near:   Left Eye Near:    Bilateral Near:     Physical Exam Vitals and nursing note reviewed.  Constitutional:      Appearance: Normal appearance. She is not ill-appearing.  HENT:     Head: Normocephalic and atraumatic.     Right Ear: Tympanic membrane, ear canal and external ear normal. There is no impacted cerumen.     Left Ear: Tympanic membrane, ear canal and external ear normal. There is no impacted cerumen.     Nose: Congestion and rhinorrhea present.     Comments: This mucosa is edematous and erythematous with clear discharge in both nares.    Mouth/Throat:     Mouth: Mucous membranes are moist.     Pharynx: Oropharynx is clear. Posterior oropharyngeal erythema present. No oropharyngeal exudate.     Comments: Erythema to the soft palate and posterior oropharynx.  No appreciable exudate.  Tonsillar pillars not well-visualized. Cardiovascular:     Rate and Rhythm: Normal rate and regular rhythm.     Pulses: Normal pulses.     Heart sounds: Normal heart sounds. No murmur heard.    No friction rub. No gallop.  Pulmonary:     Effort: Pulmonary effort is normal.     Breath sounds: Normal breath sounds. No wheezing, rhonchi or rales.  Musculoskeletal:     Cervical back: Normal range of motion and neck supple. No tenderness.  Lymphadenopathy:     Cervical: No cervical adenopathy.  Skin:    General: Skin is warm and dry.     Capillary Refill: Capillary refill  takes less than 2 seconds.     Findings: No rash.  Neurological:     General: No focal deficit present.     Mental Status: She is alert and oriented to person, place, and time.      UC Treatments / Results  Labs (all labs ordered are listed, but only abnormal results are displayed) Labs Reviewed  SARS CORONAVIRUS 2 BY RT PCR - Abnormal; Notable for the following components:      Result Value   SARS Coronavirus 2 by RT PCR POSITIVE (*)    All other components within normal limits  URINALYSIS, W/ REFLEX TO CULTURE (INFECTION SUSPECTED) - Abnormal; Notable for the following components:   APPearance CLOUDY (*)    Hgb urine dipstick SMALL (*)    Bilirubin Urine SMALL (*)    Protein, ur TRACE (*)    Leukocytes,Ua LARGE (*)    Bacteria, UA MANY (*)    All other components within normal limits  GROUP A STREP BY PCR  URINE CULTURE    EKG   Radiology No results found.  Procedures Procedures (including critical care time)  Medications Ordered in UC Medications - No data to display  Initial Impression / Assessment and Plan / UC Course  I have reviewed the triage vital signs and the nursing notes.  Pertinent labs & imaging results that were available during my care of the patient were reviewed by me and considered in my medical decision making (see chart for details).   Patient is  a nontoxic-appearing 73 year old female presenting for evaluation of respiratory urinary complaints as outlined in HPI above.  She states she feels like she has a sinus infection because she has a lot of nasal congestion and pressure but no significant nasal discharge.  When she does get out it is clear.  She has also been experiencing a lot of sneezing.  Her physical exam does reveal inflammation of her upper respiratory tract as evidenced by inflamed nasal mucosa with clear nasal discharge.  Also erythema to the soft palate and posterior oropharynx.  Tonsillar pillars not well-visualized.  Lungs are clear  to auscultation all fields.  No CVA tenderness on exam.  Given patient's cluster of respiratory symptoms differential diagnose include COVID, influenza, strep angitis, viral respiratory illness.  Given that patient is on day 3 of symptoms I will not test her for influenza at this time but I will test her for COVID.  I will also order a strep PCR.  In addition, I will order urinalysis to assess the presence of UTI.  Urinalysis has a cloudy appearance with small hemoglobin, small bilirubin, trace protein, large leukocyte esterase.  Negative for nitrites.  Reflex microscopy shows skin cell contamination with 6-10 squamous epithelials, greater than 50 WBCs, 6-10 RBCs, many bacteria and WBC clumps are present.  I will order a recollection for urine culture.  Strep PCR is negative.  COVID PCR is positive.  I will discharge patient home with diagnosis of UTI and COVID teen.  I will prescribe Atrovent  nasal spray to help with her nasal congestion and Tessalon  Perles and Promethazine  DM cough syrup for cough and congestion.  For her UTI I will start her on Macrobid  100 mg twice daily for 5 days along with Pyridium  200 mg every 8 hours as needed for urinary discomfort.  ER and return precautions reviewed.   Final Clinical Impressions(s) / UC Diagnoses   Final diagnoses:  Lower urinary tract infectious disease  COVID-19     Discharge Instructions      Your testing today was positive for COVID-19 and a urinary tract infection and.Take the Macrobid  twice daily for 5 days with food for treatment of urinary tract infection.  Hold your Hiprex  while you are taking the Macrobid  for your UTI.  Use the Pyridium  every 8 hours as needed for urinary discomfort.  This will turn your urine a bright red-orange.  Increase your oral fluid intake so that you increase your urine production and or flushing your urinary system.  Take an over-the-counter probiotic, such as Culturelle-Align-Activia, 1 hour after each  dose of antibiotic to prevent diarrhea or yeast infections from forming.  We will culture urine and change the antibiotics if necessary.  CDC guidelines state that you must wear a mask for the first 5 days of symptoms when you are around other people.  After 5 days you no longer need to mask as you are no longer considered infectious.  There is no longer need to quarantine unless you have a fever.  If you do have a fever then you need to quarantine until you have been fever free for 24 hours without taking Tylenol  and/or ibuprofen.  Use over-the-counter Tylenol  and/or ibuprofen according to the package instructions as needed for fever and pain.  Use the Atrovent  nasal spray, 2 squirts up each nostril every 6 hours, as needed for nasal congestion and runny nose.  Use the Tessalon  Perles every 8 hours during the day as needed for cough.  Take them with a  small sip of water.  You may experience numbness to the base of your tongue or metallic taste in her mouth, this is normal.  Use the Promethazine  DM cough syrup at bedtime as needed for cough and congestion.  Be mindful this medication will make you sleepy.  If you develop any worsening respiratory symptoms such as shortness of breath, shortness of breath at rest, feel as though you cannot catch your breath, you are unable to speak in full sentences, or, as a late sign, your lips begin turning blue you need to call 911 and go to the ER for evaluation.      ED Prescriptions     Medication Sig Dispense Auth. Provider   benzonatate  (TESSALON ) 100 MG capsule Take 1 capsule (100 mg total) by mouth 3 (three) times daily as needed for cough. 30 capsule Bernardino Ditch, NP   ipratropium (ATROVENT ) 0.06 % nasal spray Place 2 sprays into both nostrils 4 (four) times daily. 15 mL Bernardino Ditch, NP   promethazine -dextromethorphan (PROMETHAZINE -DM) 6.25-15 MG/5ML syrup Take 5 mLs by mouth 4 (four) times daily as needed. 118 mL Bernardino Ditch, NP    nitrofurantoin , macrocrystal-monohydrate, (MACROBID ) 100 MG capsule Take 1 capsule (100 mg total) by mouth 2 (two) times daily. 10 capsule Bernardino Ditch, NP   phenazopyridine  (PYRIDIUM ) 200 MG tablet Take 1 tablet (200 mg total) by mouth 3 (three) times daily. 6 tablet Bernardino Ditch, NP      PDMP not reviewed this encounter.   Bernardino Ditch, NP 08/21/24 [phone]

## 2024-08-21 NOTE — ED Triage Notes (Signed)
 Pt presents with a cough, sore throat, runny nose and ear fullness x 3 days. She has taken OTC cough medication. She also has dysuria x 2 days.

## 2024-08-23 ENCOUNTER — Ambulatory Visit (HOSPITAL_COMMUNITY): Payer: Self-pay

## 2024-08-23 LAB — URINE CULTURE
Culture: >=100000 — AB
Special Requests: NORMAL

## 2024-08-23 MED ORDER — SULFAMETHOXAZOLE-TRIMETHOPRIM 800-160 MG PO TABS: 1.0000 | ORAL_TABLET | Freq: Two times a day (BID) | ORAL | 0 refills | Status: AC

## 2024-08-28 DIAGNOSIS — I4819 Other persistent atrial fibrillation: Secondary | ICD-10-CM | POA: Diagnosis not present

## 2024-08-28 DIAGNOSIS — I471 Supraventricular tachycardia, unspecified: Secondary | ICD-10-CM | POA: Diagnosis not present

## 2024-08-28 DIAGNOSIS — I517 Cardiomegaly: Secondary | ICD-10-CM | POA: Diagnosis not present

## 2024-08-28 DIAGNOSIS — E782 Mixed hyperlipidemia: Secondary | ICD-10-CM | POA: Diagnosis not present

## 2024-09-16 ENCOUNTER — Other Ambulatory Visit: Payer: Self-pay | Admitting: Student

## 2024-09-16 DIAGNOSIS — K219 Gastro-esophageal reflux disease without esophagitis: Secondary | ICD-10-CM

## 2024-09-16 DIAGNOSIS — K2900 Acute gastritis without bleeding: Secondary | ICD-10-CM

## 2024-09-16 DIAGNOSIS — K2930 Chronic superficial gastritis without bleeding: Secondary | ICD-10-CM | POA: Diagnosis not present

## 2024-09-16 DIAGNOSIS — Z860100 Personal history of colon polyps, unspecified: Secondary | ICD-10-CM | POA: Diagnosis not present

## 2024-09-17 DIAGNOSIS — R008 Other abnormalities of heart beat: Secondary | ICD-10-CM | POA: Diagnosis not present

## 2024-09-17 DIAGNOSIS — I4819 Other persistent atrial fibrillation: Secondary | ICD-10-CM | POA: Diagnosis not present

## 2024-09-18 NOTE — Telephone Encounter (Signed)
 Requested medication (s) are due for refill today: yes  Requested medication (s) are on the active medication list: yes  Last refill:  06/10/24  Future visit scheduled: no  Notes to clinic:  another provider listed as PCP     Requested Prescriptions  Pending Prescriptions Disp Refills   omeprazole  (PRILOSEC) 20 MG capsule [Pharmacy Med Name: OMEPRAZOLE  DR 20 MG CAPSULE] 30 capsule 0    Sig: TAKE 1 CAPSULE BY MOUTH TWICE A DAY     Gastroenterology: Proton Pump Inhibitors Passed - 09/18/2024 11:00 AM      Passed - Valid encounter within last 12 months    Recent Outpatient Visits           5 months ago Tachycardia   [facility] Joshua Cathryne JAYSON, MD   6 months ago Hypothyroidism, unspecified type   [facility] at [facility] Joshua Cathryne JAYSON, MD       Future Appointments             In 3 months McGowan, Clotilda DELENA RIGGERS [facility] [place]

## 2024-09-30 DIAGNOSIS — E039 Hypothyroidism, unspecified: Secondary | ICD-10-CM | POA: Diagnosis not present

## 2024-09-30 DIAGNOSIS — Z1331 Encounter for screening for depression: Secondary | ICD-10-CM | POA: Diagnosis not present

## 2024-09-30 DIAGNOSIS — Z1231 Encounter for screening mammogram for malignant neoplasm of breast: Secondary | ICD-10-CM | POA: Diagnosis not present

## 2024-09-30 DIAGNOSIS — E782 Mixed hyperlipidemia: Secondary | ICD-10-CM | POA: Diagnosis not present

## 2024-09-30 DIAGNOSIS — I4811 Longstanding persistent atrial fibrillation: Secondary | ICD-10-CM | POA: Diagnosis not present

## 2024-09-30 DIAGNOSIS — Z23 Encounter for immunization: Secondary | ICD-10-CM | POA: Diagnosis not present

## 2024-09-30 DIAGNOSIS — K219 Gastro-esophageal reflux disease without esophagitis: Secondary | ICD-10-CM | POA: Diagnosis not present

## 2024-10-01 ENCOUNTER — Other Ambulatory Visit: Payer: Self-pay | Admitting: Internal Medicine

## 2024-10-01 DIAGNOSIS — Z1231 Encounter for screening mammogram for malignant neoplasm of breast: Secondary | ICD-10-CM

## 2024-10-09 ENCOUNTER — Other Ambulatory Visit: Payer: Self-pay | Admitting: Student

## 2024-10-09 DIAGNOSIS — F330 Major depressive disorder, recurrent, mild: Secondary | ICD-10-CM

## 2024-10-10 NOTE — Telephone Encounter (Signed)
 Requested by interface surescripts. Medication discontinued 08/08/24. Patient no longer at [facility].  Requested Prescriptions  Refused Prescriptions Disp Refills   sertraline  (ZOLOFT ) 50 MG tablet [Pharmacy Med Name: SERTRALINE  HCL 50 MG TABLET] 30 tablet 0    Sig: TAKE 1 TABLET BY MOUTH EVERY DAY     Psychiatry:  Antidepressants - SSRI - sertraline  Failed - 10/10/2024  2:00 PM      Failed - Valid encounter within last 6 months    Recent Outpatient Visits           6 months ago Tachycardia   [facility] Joshua Cathryne JAYSON, MD   7 months ago Hypothyroidism, unspecified type   [facility] at [facility] Joshua Cathryne JAYSON, MD       Future Appointments             In 2 months McGowan, Clotilda LABOR, PA-C [facility] [place]            Passed - AST in normal range and within 360 days    AST  Date Value Ref Range Status  02/27/2024 26 0 - 40 IU/L Final         Passed - ALT in normal range and within 360 days    ALT  Date Value Ref Range Status  02/27/2024 21 0 - 32 IU/L Final         Passed - Completed PHQ-2 or PHQ-9 in the last 360 days

## 2024-10-16 DIAGNOSIS — R399 Unspecified symptoms and signs involving the genitourinary system: Secondary | ICD-10-CM | POA: Diagnosis not present

## 2024-10-16 DIAGNOSIS — Z87440 Personal history of urinary (tract) infections: Secondary | ICD-10-CM | POA: Diagnosis not present

## 2024-10-18 NOTE — Progress Notes (Signed)
 10/18/24  total time spent on lab and the results phone call: 20 min's

## 2024-11-05 ENCOUNTER — Ambulatory Visit

## 2024-11-15 ENCOUNTER — Telehealth: Admitting: Emergency Medicine

## 2024-11-15 DIAGNOSIS — R300 Dysuria: Secondary | ICD-10-CM | POA: Diagnosis not present

## 2024-11-15 NOTE — Progress Notes (Signed)
  Because you have recurrent UTI symptoms and have recently been evaluated for the same, I feel your condition warrants further evaluation and I recommend that you be seen in a face-to-face visit.  You need to have another urinalysis and urine culture.    Please note that recurrent UTIs fall outside the scope of practice for digital health care with [facility], therefore we must refer you to an in person clinic.   NOTE: There will be NO CHARGE for this E-Visit   If you are having a true medical emergency, please call 911.     For an urgent face to face visit, [place] has multiple urgent care centers for your convenience.  Click the link below for the full list of locations and hours, walk-in wait times, appointment scheduling options and driving directions:  Urgent Care - [place], [place], [place], [place], [place], [place]  [place]     Your MyChart E-visit questionnaire answers were reviewed by a board certified advanced clinical practitioner to complete your personal care plan based on your specific symptoms.    Thank you for using e-Visits.

## 2024-11-16 ENCOUNTER — Inpatient Hospital Stay: Admission: RE | Admit: 2024-11-16 | Discharge: 2024-11-16 | Attending: Physician Assistant

## 2024-11-16 VITALS — BP 146/88 | HR 84 | Temp 98.1°F | Resp 14 | Ht 66.0 in | Wt 225.1 lb

## 2024-11-16 DIAGNOSIS — N3001 Acute cystitis with hematuria: Secondary | ICD-10-CM | POA: Diagnosis not present

## 2024-11-16 LAB — POCT URINE DIPSTICK
Bilirubin, UA: NEGATIVE
Glucose, UA: NEGATIVE mg/dL
Nitrite, UA: POSITIVE — AB
Protein Ur, POC: 100 mg/dL — AB
Spec Grav, UA: 1.015 (ref 1.010–1.025)
Urobilinogen, UA: 0.2 U/dL
pH, UA: 7 (ref 5.0–8.0)

## 2024-11-16 MED ORDER — CIPROFLOXACIN HCL 250 MG PO TABS: 250.0000 mg | ORAL_TABLET | Freq: Two times a day (BID) | ORAL | 0 refills | Status: AC

## 2024-11-16 NOTE — ED Triage Notes (Signed)
 Patient has history of reoccurring UTIs.  Patient reports urinary urgency and dysuria that started 2-3 days ago.  Patients that she took AZO yesterday.  Patient denies fevers.

## 2024-11-16 NOTE — ED Provider Notes (Signed)
 [facility] [place] [facility] - [place], [place]   Name: Brenda Campbell DOB: [birth year] MRN: [id number] CSN: [id number] PCP: Salli Amato, MD  Arrival date and time:  11/16/24 1328  Chief Complaint:  Urinary Retention (Appointment)   NOTE: Prior to seeing the patient today, I have reviewed the triage nursing documentation and vital signs. Clinical staff has updated patient's PMH/PSHx, current medication list, and drug allergies/intolerances to ensure comprehensive history available to assist in medical decision making.   History:   HPI: Brenda Campbell is a 73 y.o. female who presents today alone with complaints of UTI symptoms.  Patient states her symptoms are worsened over the past 48 hours.  She has frequent UTIs with her last treated UTI being in September of this year.  She had UTI symptoms in November, however she did not receive antibiotics when evaluated.  The symptoms in November did eventually resolve, but she is concerned that the severity of her symptoms now is resolved over undertreated UTI last month.  She denies nausea vomiting or diarrhea.  Her primary symptoms are frequency and pain during urination.   Past Medical History:  Diagnosis Date   A-fib (HCC) 03/26/2023   cardiac appt scheduled   Anxiety    Arthritis    Blood transfusion without reported diagnosis    Cancer (HCC)    small bowel ca   Depression    GERD (gastroesophageal reflux disease)    Hyperlipidemia    Hypertension    Mild mitral regurgitation by prior echocardiogram    Mild mitral regurgitation by prior echocardiogram    Mild tricuspid regurgitation by prior echocardiogram    Thyroid  disease     Past Surgical History:  Procedure Laterality Date   CARDIOVERSION N/A 06/25/2024   Procedure: CARDIOVERSION;  Surgeon: Wilburn Keller BROCKS, MD;  Location: [facility];  Service: Cardiovascular;  Laterality: N/A;   CARDIOVERSION N/A 08/13/2024   Procedure: CARDIOVERSION;  Surgeon: Wilburn Keller BROCKS, MD;   Location: [facility];  Service: Cardiovascular;  Laterality: N/A;   CATARACT EXTRACTION W/PHACO Right 03/25/2024   Procedure: PHACOEMULSIFICATION, CATARACT, WITH IOL INSERTION 13.25 01:12.0;  Surgeon: Myrna Adine Anes, MD;  Location: [facility];  Service: Ophthalmology;  Laterality: Right;   CATARACT EXTRACTION W/PHACO Left 04/08/2024   Procedure: PHACOEMULSIFICATION, CATARACT, WITH IOL INSERTION 7.43 00:52.5;  Surgeon: Myrna Adine Anes, MD;  Location: [facility];  Service: Ophthalmology;  Laterality: Left;   CESAREAN SECTION     x 1   CHOLECYSTECTOMY     COLONOSCOPY  10/12/2014   cleared for 3 years   COLONOSCOPY N/A 03/16/2017   Procedure: COLONOSCOPY;  Surgeon: Rogelia Copping, MD;  Location: [facility] END[facility]COPY;  Service: Endoscopy;  Laterality: N/A;   COLONOSCOPY WITH PROPOFOL  N/A 12/01/2017   Procedure: COLONOSCOPY WITH PROPOFOL ;  Surgeon: Therisa Bi, MD;  Location: [facility] END[facility]COPY;  Service: Gastroenterology;  Laterality: N/A;   COLONOSCOPY WITH PROPOFOL  N/A 04/12/2021   Procedure: COLONOSCOPY WITH PROPOFOL ;  Surgeon: Therisa Bi, MD;  Location: [facility];  Service: Gastroenterology;  Laterality: N/A;   ESOPHAGOGASTRODUODENOSCOPY (EGD) WITH PROPOFOL  N/A 03/15/2017   Procedure: ESOPHAGOGASTRODUODENOSCOPY (EGD) WITH PROPOFOL ;  Surgeon: Bi Therisa, MD;  Location: [facility] ENDOSCOPY;  Service: Endoscopy;  Laterality: N/A;   ESOPHAGOGASTRODUODENOSCOPY (EGD) WITH PROPOFOL  N/A 03/30/2017   Procedure: ESOPHAGOGASTRODUODENOSCOPY (EGD) WITH PROPOFOL ;  Surgeon: Bi Therisa, MD;  Location: [facility] ENDOSCOPY;  Service: Endoscopy;  Laterality: N/A;  Endoscopic Capsule Placement   ESOPHAGOGASTRODUODENOSCOPY (EGD) WITH PROPOFOL  N/A 12/01/2017   Procedure: ESOPHAGOGASTRODUODENOSCOPY (EGD) WITH PROPOFOL ;  Surgeon: Therisa Bi, MD;  Location: [facility];  Service: Gastroenterology;  Laterality: N/A;   ESOPHAGOGASTRODUODENOSCOPY (EGD) WITH PROPOFOL  N/A 04/12/2021   Procedure:  ESOPHAGOGASTRODUODENOSCOPY (EGD) WITH PROPOFOL ;  Surgeon: Therisa Bi, MD;  Location: [facility] END[facility]COPY;  Service: Gastroenterology;  Laterality: N/A;   FRACTURE SURGERY     GALLBLADDER SURGERY     GIVENS CAPSULE STUDY N/A 03/17/2017   Procedure: GIVENS CAPSULE STUDY;  Surgeon: Rogelia Copping, MD;  Location: [facility] ENDOSCOPY;  Service: Endoscopy;  Laterality: N/A;   GIVENS CAPSULE STUDY N/A 12/01/2017   Procedure: GIVENS CAPSULE STUDY, 12 HOUR;  Surgeon: Therisa Bi, MD;  Location: [facility] ENDOSCOPY;  Service: Gastroenterology;  Laterality: N/A;   SMALL INTESTINE SURGERY     TEE WITHOUT CARDIOVERSION N/A 06/25/2024   Procedure: ECHOCARDIOGRAM, TRANSESOPHAGEAL;  Surgeon: Alluri, Keller BROCKS, MD;  Location: [facility];  Service: Cardiovascular;  Laterality: N/A;   THROAT SURGERY     nodule on vocal chord   TIBIA FRACTURE SURGERY Right    WRIST SURGERY Right    arthritis    Family History  Problem Relation Age of Onset   Diabetes Mother    Heart disease Mother    Alzheimer's disease Mother    Other Father 32       old age   Diabetes Brother    Breast cancer Neg Hx     Social History   Tobacco Use   Smoking status: Former    Current packs/day: 0.00    Average packs/day: 1.5 packs/day for 27.0 years (40.5 ttl pk-yrs)    Types: Cigarettes    Start date: 76    Quit date: 1999    Years since quitting: 26.9   Smokeless tobacco: Never  Vaping Use   Vaping status: Never Used  Substance Use Topics   Alcohol use: Yes    Alcohol/week: 20.0 standard drinks of alcohol    Types: 20 Shots of liquor per week    Comment: 3-4  cocktails 6 days per week   Drug use: No    Patient Active Problem List   Diagnosis Date Noted   Recurrent UTI 10/16/2023   Gastrointestinal hemorrhage with melena 06/28/2017   Blood in stool    Acute GI bleeding    Benign neoplasm of transverse colon    GIB (gastrointestinal bleeding) 03/15/2017   Panic disorder 03/13/2017   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    Allergies:   Aspirin  Review of Systems (ROS): Review of Systems  Constitutional:  Negative for chills, fatigue and fever.  Gastrointestinal:  Positive for nausea. Negative for abdominal pain, diarrhea and vomiting.  Genitourinary:  Positive for dysuria, flank pain and frequency. Negative for difficulty urinating and pelvic pain.  Skin:  Negative for color change.  All other systems reviewed and are negative.    Vital Signs: Today's Vitals   11/16/24 1352 11/16/24 1354 11/16/24 1421  BP:  (!) 146/88   Pulse:  84   Resp:  14   Temp:  98.1 F (36.7 C)   TempSrc:  Oral  SpO2:  100%   Weight: 225 lb 1.4 oz (102.1 kg)    Height: 5' 6 (1.676 m)    PainSc: 7   7     Physical Exam: Physical Exam Vitals and nursing note reviewed.  Constitutional:      Appearance: Normal appearance.  Cardiovascular:     Rate and Rhythm: Normal rate and regular rhythm.     Pulses: Normal pulses.     Heart sounds: Normal heart sounds.  Pulmonary:     Effort: Pulmonary effort is normal.     Breath sounds: Normal breath sounds.  Abdominal:     Tenderness: There is abdominal tenderness in the suprapubic area. There is no right CVA tenderness or left CVA tenderness.  Skin:    General: Skin is warm and dry.  Neurological:     General: No focal deficit present.     Mental Status: She is alert and oriented to person, place, and time.  Psychiatric:        Mood and Affect: Mood normal.        Behavior: Behavior  normal.      Urgent Care Treatments / Results:   LABS: PLEASE NOTE: all labs that were ordered this encounter are listed, however only abnormal results are displayed. Labs Reviewed  POCT URINE DIPSTICK - Abnormal; Notable for the following components:      Result Value   Clarity, UA cloudy (*)    Ketones, POC UA trace (5) (*)    Blood, UA moderate (*)    Protein Ur, POC =100 (*)    Nitrite, UA Positive (*)    Leukocytes, UA Large (3+) (*)    All other components within normal limits  URINE CULTURE    EKG: -None  RADIOLOGY: No results found.  PROCEDURES: Procedures  MEDICATIONS RECEIVED THIS VISIT: Medications - No data to display  PERTINENT CLINICAL COURSE NOTES/UPDATES:   Initial Impression / Assessment and Plan / Urgent Care Course:  Pertinent labs & imaging results that were available during my care of the patient were personally reviewed by me and considered in my medical decision making (see lab/imaging section of note for values and interpretations).  Brenda Campbell is a 73 y.o. female who presents to [facility] today with complaints of urinary symptoms, diagnosed with UTI, and treated as such with the medications below. NP and patient reviewed discharge instructions below during visit.   Patient is well appearing overall in clinic today. She does not appear to be in any acute distress. Presenting symptoms (see HPI) and exam as documented above.   I have reviewed the follow up and strict return precautions for any new or worsening symptoms. Patient is aware of symptoms that would be deemed urgent/emergent, and would thus require further evaluation either here or in the emergency department. At the time of discharge, she verbalized understanding and consent with the discharge plan as it was reviewed with her. All questions were fielded by provider and/or clinic staff prior to patient discharge.    Final Clinical Impressions / Urgent Care Diagnoses:    Final diagnoses:  Acute cystitis with hematuria    New Prescriptions:  [place] Controlled Substance Registry consulted? Not Applicable  Meds ordered this encounter  Medications   ciprofloxacin  (CIPRO ) 250 MG tablet    Sig: Take 1 tablet (250 mg total) by mouth every 12 (twelve) hours for 7 days.    Dispense:  14 tablet    Refill:  0  Discharge Instructions      You were seen for dysuria and are being treated for urinary tract infection.   - We are sending your urine out for a culture. If we need to add or change any medications, our nurse will give you a call to let you know. - Take the antibiotics as prescribed until they're finished. If you think you're having a reaction, stop the medication, take benadryl and go to the nearest urgent care/emergency room. Take a probiotic while taking the antibiotic to decrease the chances of stomach upset.    Take care, Dr. Morene, NP-c     Recommended Follow up Care:  Patient encouraged to follow up with the following provider within the specified time frame, or sooner as dictated by the severity of her symptoms. As always, she was instructed that for any urgent/emergent care needs, she should seek care either here or in the emergency department for more immediate evaluation.   Marylu Morene, DNP, NP-c   Morene Marylu, NP 11/16/24 1432

## 2024-11-16 NOTE — Discharge Instructions (Addendum)
You were seen for dysuria and are being treated for urinary tract infection.   - We are sending your urine out for a culture. If we need to add or change any medications, our nurse will give you a call to let you know. - Take the antibiotics as prescribed until they're finished. If you think you're having a reaction, stop the medication, take benadryl and go to the nearest urgent care/emergency room. Take a probiotic while taking the antibiotic to decrease the chances of stomach upset.    Take care, Dr. Sharlet Salina, NP-c

## 2024-11-18 ENCOUNTER — Ambulatory Visit (HOSPITAL_COMMUNITY): Payer: Self-pay

## 2024-11-18 LAB — URINE CULTURE
Culture: >=100000 — AB
Special Requests: NORMAL

## 2024-12-15 ENCOUNTER — Telehealth: Admitting: Physician Assistant

## 2024-12-15 DIAGNOSIS — R3989 Other symptoms and signs involving the genitourinary system: Secondary | ICD-10-CM | POA: Diagnosis not present

## 2024-12-16 ENCOUNTER — Ambulatory Visit: Payer: Self-pay

## 2024-12-16 MED ORDER — PROPOFOL 1000 MG/100ML IV EMUL
INTRAVENOUS | Status: AC
  Filled 2024-12-16: qty 100

## 2024-12-16 MED ORDER — CEPHALEXIN 500 MG PO CAPS: 500.0000 mg | ORAL_CAPSULE | Freq: Two times a day (BID) | ORAL | 0 refills | Status: AC

## 2024-12-16 MED ORDER — PROPOFOL 10 MG/ML IV BOLUS
INTRAVENOUS | Status: AC
  Filled 2024-12-16: qty 20

## 2024-12-16 NOTE — Progress Notes (Signed)

## 2024-12-18 ENCOUNTER — Ambulatory Visit
Admission: RE | Admit: 2024-12-18 | Discharge: 2024-12-18 | Disposition: A | Discharge status: Home / Self Care | Source: Ambulatory Visit | Attending: Internal Medicine | Admitting: Internal Medicine

## 2024-12-18 DIAGNOSIS — Z1231 Encounter for screening mammogram for malignant neoplasm of breast: Secondary | ICD-10-CM | POA: Insufficient documentation

## 2024-12-30 NOTE — Progress Notes (Signed)
 "    12/31/2024 4:47 PM   Brenda Campbell [birth year] [id number]  Referring provider: Joshua Cathryne BROCKS, MD No address on file  Urological history: 1.  rUTI's -Contributing factors of age, vaginal atrophy, -RUS (09/2021) -  Echogenic foci in both kidneys without posterior shadowing. These may represent nonobstructing stones versus renal sinus fat -KUB (09/2021) - 3 punctate calculi measuring up to 2 mm overlying the upper pole the right kidney. A possible 2 mm calculus overlies the medial interpolar region of the left kidney. No urolithiasis. -cysto (12/2021) - Mild erythematous at trigone with whitish plaques consistent with squamous metasplasia -CTU (08/2022) - Bilateral nephrolithiasis.  -Documented urine urine cultures over the last year -December 6th, 2025 - E.coli  -November 5th, 2025 - MUF   -Vaginal estrogen cream three nights weekly and Hiprex  1 gram daily (cannot tolerate two times daily)  2. Urge incontinence -Contributing factors of age, vaginal atrophy, former smoker, hypertension, anxiety, depression, obesity, arthritis and benzodiazepines  3.  High risk hematuria -Former smoker -CTU (08/2022) - no worrisome findings -cysto (12/2021) - NED  Chief Complaint  Patient presents with   Recurrent UTI   HPI: Brenda Campbell is a 74 y.o. female who presents today for one year follow up.   Previous records reviewed.   She had a telehealth visit in January for an UTI.  She was prescribed Keflex  500 mg BID x 7 days.  She feels like she still has the symptoms.  She is experiencing a burning feeling and urgency.    She is having 1-7 daytime voids, 3 more episodes of nocturia with strong urge to urinate.  She has both stress and urge incontinence.  She leaks 1-2 times a week.  She wears incontinent products when she is an environment and is unsure where the restroom facilities are.  She does not limit fluid intake.  She does engage in toilet mapping.  Patient  denies any modifying or aggravating factors.  Patient denies any gross hematuria or suprapubic/flank pain.  Patient denies any fevers, chills, nausea or vomiting.    UA yellow cloudy urine, specific gravity 1.020, pH 5.5, trace blood, 2+ leukocyte, 11-30 WBCs, 0-2 RBCs, and 0-10 epithelial cells  PVR 121 mL   Serum creatinine (02/2024) 0.58  Hemoglobin A1c (02/2024) 5.5  OAB agent: Vesicare 10 mg daily   She is applying the vaginal cream three nights weekly.  She is taking Hiprex  1 g daily.  PMH: Past Medical History:  Diagnosis Date   A-fib (HCC) 03/26/2023   cardiac appt scheduled   Anxiety    Arthritis    Blood transfusion without reported diagnosis    Cancer (HCC)    small bowel ca   Depression    GERD (gastroesophageal reflux disease)    Hyperlipidemia    Hypertension    Mild mitral regurgitation by prior echocardiogram    Mild mitral regurgitation by prior echocardiogram    Mild tricuspid regurgitation by prior echocardiogram    Thyroid  disease     Surgical History: Past Surgical History:  Procedure Laterality Date   CARDIOVERSION N/A 06/25/2024   Procedure: CARDIOVERSION;  Surgeon: Wilburn Keller BROCKS, MD;  Location: [facility];  Service: Cardiovascular;  Laterality: N/A;   CARDIOVERSION N/A 08/13/2024   Procedure: CARDIOVERSION;  Surgeon: Wilburn Keller BROCKS, MD;  Location: [facility];  Service: Cardiovascular;  Laterality: N/A;   CATARACT EXTRACTION W/PHACO Right 03/25/2024   Procedure: PHACOEMULSIFICATION, CATARACT, WITH IOL INSERTION 13.25 01:12.0;  Surgeon: Myrna Adine Anes, MD;  Location: [facility];  Service: Ophthalmology;  Laterality: Right;   CATARACT EXTRACTION W/PHACO Left 04/08/2024   Procedure: PHACOEMULSIFICATION, CATARACT, WITH IOL INSERTION 7.43 00:52.5;  Surgeon: Myrna Adine Anes, MD;  Location: [facility];  Service: Ophthalmology;  Laterality: Left;   CESAREAN SECTION     x 1   CHOLECYSTECTOMY     COLONOSCOPY  10/12/2014   cleared  for 3 years   COLONOSCOPY N/A 03/16/2017   Procedure: COLONOSCOPY;  Surgeon: Rogelia Copping, MD;  Location: [facility] END[facility]COPY;  Service: Endoscopy;  Laterality: N/A;   COLONOSCOPY N/A 01/01/2025   Procedure: COLONOSCOPY;  Surgeon: Therisa Bi, MD;  Location: [facility] END[facility];  Service: Gastroenterology;  Laterality: N/A;   COLONOSCOPY WITH PROPOFOL  N/A 12/01/2017   Procedure: COLONOSCOPY WITH PROPOFOL ;  Surgeon: Therisa Bi, MD;  Location: [facility] END[facility];  Service: Gastroenterology;  Laterality: N/A;   COLONOSCOPY WITH PROPOFOL  N/A 04/12/2021   Procedure: COLONOSCOPY WITH PROPOFOL ;  Surgeon: Therisa Bi, MD;  Location: [facility] END[facility]COPY;  Service: Gastroenterology;  Laterality: N/A;   ESOPHAGOGASTRODUODENOSCOPY (EGD) WITH PROPOFOL  N/A 03/15/2017   Procedure: ESOPHAGOGASTRODUODENOSCOPY (EGD) WITH PROPOFOL ;  Surgeon: Bi Therisa, MD;  Location: [facility] END[facility]COPY;  Service: Endoscopy;  Laterality: N/A;   ESOPHAGOGASTRODUODENOSCOPY (EGD) WITH PROPOFOL  N/A 03/30/2017   Procedure: ESOPHAGOGASTRODUODENOSCOPY (EGD) WITH PROPOFOL ;  Surgeon: Bi Therisa, MD;  Location: [facility] ENDOSCOPY;  Service: Endoscopy;  Laterality: N/A;  Endoscopic Capsule Placement   ESOPHAGOGASTRODUODENOSCOPY (EGD) WITH PROPOFOL  N/A 12/01/2017   Procedure: ESOPHAGOGASTRODUODENOSCOPY (EGD) WITH PROPOFOL ;  Surgeon: Therisa Bi, MD;  Location: [facility];  Service: Gastroenterology;  Laterality: N/A;   ESOPHAGOGASTRODUODENOSCOPY (EGD) WITH PROPOFOL  N/A 04/12/2021   Procedure: ESOPHAGOGASTRODUODENOSCOPY (EGD) WITH PROPOFOL ;  Surgeon: Therisa Bi, MD;  Location: [facility] ENDOSCOPY;  Service: Gastroenterology;  Laterality: N/A;   FRACTURE SURGERY     GALLBLADDER SURGERY     GIVENS CAPSULE STUDY N/A 03/17/2017   Procedure: GIVENS CAPSULE STUDY;  Surgeon: Rogelia Copping, MD;  Location: [facility] ENDOSCOPY;  Service: Endoscopy;  Laterality: N/A;   GIVENS CAPSULE STUDY N/A 12/01/2017   Procedure: GIVENS CAPSULE STUDY, 12 HOUR;  Surgeon: Therisa Bi, MD;  Location:  [facility] ENDOSCOPY;  Service: Gastroenterology;  Laterality: N/A;   HEMOSTASIS CLIP PLACEMENT  01/01/2025   Procedure: CONTROL OF HEMORRHAGE, GI TRACT, ENDOSCOPIC, BY CLIPPING OR OVERSEWING;  Surgeon: Therisa Bi, MD;  Location: [facility] ENDOSCOPY;  Service: Gastroenterology;;   POLYPECTOMY  01/01/2025   Procedure: POLYPECTOMY, INTESTINE;  Surgeon: Therisa Bi, MD;  Location: [facility] ENDOSCOPY;  Service: Gastroenterology;;   SMALL INTESTINE SURGERY     SUBMUCOSAL INJECTION  01/01/2025   Procedure: INJECTION, SUBMUCOSAL;  Surgeon: Therisa Bi, MD;  Location: [facility] ENDOSCOPY;  Service: Gastroenterology;;   TEE WITHOUT CARDIOVERSION N/A 06/25/2024   Procedure: ECHOCARDIOGRAM, TRANSESOPHAGEAL;  Surgeon: Alluri, Keller BROCKS, MD;  Location: [facility];  Service: Cardiovascular;  Laterality: N/A;   THROAT SURGERY     nodule on vocal chord   TIBIA FRACTURE SURGERY Right    WRIST SURGERY Right    arthritis    Home Medications:  Allergies as of 12/31/2024       Reactions   Aspirin    Avoids D/T Hx of GI Bleed        Medication List        Accurate as of December 31, 2024 11:59 PM. If you have any questions, ask your nurse or doctor.          amiodarone 200 MG tablet Commonly known as: PACERONE Take 200 mg by mouth daily.   benzonatate  100  MG capsule Commonly known as: TESSALON  Take 1 capsule (100 mg total) by mouth 3 (three) times daily as needed for cough.   carboxymethylcellulose 0.5 % Soln Commonly known as: REFRESH PLUS Place 1 drop into both eyes 3 (three) times daily as needed (dry/irritated eyes).   cephALEXin  500 MG capsule Commonly known as: KEFLEX  Take 1 capsule (500 mg total) by mouth 2 (two) times daily.   cetirizine 10 MG tablet Commonly known as: ZYRTEC Take 10 mg by mouth daily as needed for allergies.   clotrimazole -betamethasone  cream Commonly known as: LOTRISONE  APPLY EXTERNALLY TWICE DAILY FOR 2 WKS What changed:  how much to take how to take this when to take  this reasons to take this additional instructions   DULoxetine 30 MG capsule Commonly known as: CYMBALTA Take 30 mg by mouth daily.   Eliquis 5 MG Tabs tablet Generic drug: apixaban Take 5 mg by mouth 2 (two) times daily.   estradiol  0.1 MG/GM vaginal cream Commonly known as: ESTRACE  VAGINAL Apply 0.5mg  (pea-sized amount)  just inside the vaginal introitus with a finger-tip on Monday, Wednesday and Friday nights.   ipratropium 0.06 % nasal spray Commonly known as: ATROVENT  Place 2 sprays into both nostrils 4 (four) times daily.   levothyroxine  50 MCG tablet Commonly known as: SYNTHROID  Take 1 tablet (50 mcg total) by mouth daily.   magnesium gluconate 500 MG tablet Commonly known as: MAGONATE Take 500 mg by mouth daily.   methenamine  1 g tablet Commonly known as: HIPREX  TAKE 1 TABLET (1 G TOTAL) BY MOUTH 2 (TWO) TIMES DAILY WITH A MEAL. What changed: when to take this   metoprolol succinate 25 MG 24 hr tablet Commonly known as: TOPROL-XL Take 25 mg by mouth daily as needed (tachycardia).   omeprazole  20 MG capsule Commonly known as: PRILOSEC Take 1 capsule (20 mg total) by mouth 2 (two) times daily. What changed: when to take this   phenazopyridine  200 MG tablet Commonly known as: PYRIDIUM  Take 1 tablet (200 mg total) by mouth 3 (three) times daily.   Premarin  vaginal cream Generic drug: conjugated estrogens  Estrogen Cream Instruction Discard applicator Apply pea sized amount to tip of finger to urethra before bed. Wash hands well after application. Use Monday, Wednesday and Friday   Probiotic Daily Caps Take 1 capsule by mouth daily.   promethazine -dextromethorphan 6.25-15 MG/5ML syrup Commonly known as: PROMETHAZINE -DM Take 5 mLs by mouth 4 (four) times daily as needed.   rosuvastatin  5 MG tablet Commonly known as: CRESTOR  Take 1 tablet (5 mg total) by mouth daily.        Allergies:  Allergies  Allergen Reactions   Aspirin     Avoids D/T Hx of GI  Bleed    Family History: Family History  Problem Relation Age of Onset   Diabetes Mother    Heart disease Mother    Alzheimer's disease Mother    Other Father 2       old age   Diabetes Brother    Breast cancer Neg Hx     Social History:  reports that she quit smoking about 27 years ago. Her smoking use included cigarettes. She started smoking about 54 years ago. She has a 40.5 pack-year smoking history. She has never used smokeless tobacco. She reports current alcohol use of about 20.0 standard drinks of alcohol per week. She reports that she does not use drugs.  ROS: Pertinent ROS in HPI  Physical Exam: BP (!) 145/87   Pulse 89  SpO2 99%   Constitutional:  Well nourished. Alert and oriented, No acute distress. HEENT: [place] AT, moist mucus membranes.  Trachea midline Cardiovascular: No clubbing, cyanosis, or edema. Respiratory: Normal respiratory effort, no increased work of breathing. Neurologic: Grossly intact, no focal deficits, moving all 4 extremities. Psychiatric: Normal mood and affect.    Laboratory Data: See EPIC and HPI I have reviewed the labs.   Pertinent Imaging:  12/31/24 15:00  Scan Result    Assessment & Plan:    1. rUTI's - criteria for recurrent UTI has been met with 2 or more infections in 6 months or 3 or greater infections in one year  - continue vaginal estrogen cream as it is first-line therapy for postmenopausal women  - patient is instructed to increase their water intake until the urine is pale yellow or clear (10 to 12 cups daily)  - UA w/ pyuria - urine sent for culture - will wait to see the results for the next steps                                             2. Vaginal atrophy -Continue vaginal estrogen cream 3 nights weekly  Return for Follow up pending labs.  These notes generated with voice recognition software. I apologize for typographical errors.  Brenda Campbell  [facility] [address]  [address] [place], [place] [address] [phone]  "

## 2024-12-31 ENCOUNTER — Ambulatory Visit (INDEPENDENT_AMBULATORY_CARE_PROVIDER_SITE_OTHER): Payer: Self-pay | Admitting: Urology

## 2024-12-31 VITALS — BP 145/87 | HR 89

## 2024-12-31 DIAGNOSIS — N390 Urinary tract infection, site not specified: Secondary | ICD-10-CM | POA: Diagnosis not present

## 2024-12-31 DIAGNOSIS — N952 Postmenopausal atrophic vaginitis: Secondary | ICD-10-CM | POA: Diagnosis not present

## 2024-12-31 LAB — URINALYSIS, COMPLETE
Bilirubin, UA: NEGATIVE
Glucose, UA: NEGATIVE
Ketones, UA: NEGATIVE
Nitrite, UA: NEGATIVE
Protein,UA: NEGATIVE
Specific Gravity, UA: 1.02 (ref 1.005–1.030)
Urobilinogen, Ur: 0.2 mg/dL (ref 0.2–1.0)
pH, UA: 5.5 (ref 5.0–7.5)

## 2024-12-31 LAB — BLADDER SCAN AMB NON-IMAGING

## 2024-12-31 LAB — MICROSCOPIC EXAMINATION: Bacteria, UA: NONE SEEN

## 2025-01-01 ENCOUNTER — Ambulatory Visit
Admission: RE | Admit: 2025-01-01 | Discharge: 2025-01-01 | Disposition: A | Discharge status: Home / Self Care | Attending: Gastroenterology | Admitting: Gastroenterology

## 2025-01-01 ENCOUNTER — Encounter: Payer: Self-pay | Admitting: Gastroenterology

## 2025-01-01 ENCOUNTER — Ambulatory Visit: Payer: Self-pay | Admitting: Registered Nurse

## 2025-01-01 ENCOUNTER — Encounter
Admission: RE | Disposition: A | Discharge status: Home / Self Care | Payer: Self-pay | Source: Home / Self Care | Attending: Gastroenterology

## 2025-01-01 DIAGNOSIS — Z1211 Encounter for screening for malignant neoplasm of colon: Secondary | ICD-10-CM | POA: Insufficient documentation

## 2025-01-01 DIAGNOSIS — K219 Gastro-esophageal reflux disease without esophagitis: Secondary | ICD-10-CM | POA: Diagnosis not present

## 2025-01-01 DIAGNOSIS — I4891 Unspecified atrial fibrillation: Secondary | ICD-10-CM | POA: Diagnosis not present

## 2025-01-01 DIAGNOSIS — D122 Benign neoplasm of ascending colon: Secondary | ICD-10-CM | POA: Insufficient documentation

## 2025-01-01 DIAGNOSIS — Z7901 Long term (current) use of anticoagulants: Secondary | ICD-10-CM | POA: Diagnosis not present

## 2025-01-01 DIAGNOSIS — E039 Hypothyroidism, unspecified: Secondary | ICD-10-CM | POA: Diagnosis not present

## 2025-01-01 DIAGNOSIS — Z87891 Personal history of nicotine dependence: Secondary | ICD-10-CM | POA: Insufficient documentation

## 2025-01-01 DIAGNOSIS — I10 Essential (primary) hypertension: Secondary | ICD-10-CM | POA: Diagnosis not present

## 2025-01-01 DIAGNOSIS — Z6834 Body mass index (BMI) 34.0-34.9, adult: Secondary | ICD-10-CM | POA: Insufficient documentation

## 2025-01-01 DIAGNOSIS — E66813 Obesity, class 3: Secondary | ICD-10-CM | POA: Diagnosis not present

## 2025-01-01 DIAGNOSIS — M1990 Unspecified osteoarthritis, unspecified site: Secondary | ICD-10-CM | POA: Insufficient documentation

## 2025-01-01 DIAGNOSIS — K5730 Diverticulosis of large intestine without perforation or abscess without bleeding: Secondary | ICD-10-CM | POA: Diagnosis not present

## 2025-01-01 HISTORY — PX: COLONOSCOPY: SHX5424

## 2025-01-01 HISTORY — PX: SUBMUCOSAL INJECTION: SHX5543

## 2025-01-01 HISTORY — PX: POLYPECTOMY: SHX149

## 2025-01-01 HISTORY — PX: HEMOSTASIS CLIP PLACEMENT: SHX6857

## 2025-01-01 MED ORDER — PHENYLEPHRINE 80 MCG/ML (10ML) SYRINGE FOR IV PUSH (FOR BLOOD PRESSURE SUPPORT)
PREFILLED_SYRINGE | INTRAVENOUS | Status: DC | PRN
  Administered 2025-01-01 (×2): 160 ug via INTRAVENOUS

## 2025-01-01 MED ORDER — GLYCOPYRROLATE 0.2 MG/ML IJ SOLN
INTRAMUSCULAR | Status: DC | PRN
  Administered 2025-01-01: .2 mg via INTRAVENOUS

## 2025-01-01 MED ORDER — PROPOFOL 10 MG/ML IV BOLUS
INTRAVENOUS | Status: DC | PRN
  Administered 2025-01-01: 20 mg via INTRAVENOUS
  Administered 2025-01-01: 60 mg via INTRAVENOUS
  Administered 2025-01-01: 20 mg via INTRAVENOUS

## 2025-01-01 MED ORDER — PROPOFOL 500 MG/50ML IV EMUL
INTRAVENOUS | Status: DC | PRN
  Administered 2025-01-01: 155 ug/kg/min via INTRAVENOUS

## 2025-01-01 MED ORDER — LIDOCAINE HCL (CARDIAC) PF 100 MG/5ML IV SOSY
PREFILLED_SYRINGE | INTRAVENOUS | Status: DC | PRN
  Administered 2025-01-01: 100 mg via INTRAVENOUS

## 2025-01-01 MED ORDER — SODIUM CHLORIDE 0.9 % IV SOLN
INTRAVENOUS | Status: DC
  Administered 2025-01-01: 20 mL/h via INTRAVENOUS

## 2025-01-01 NOTE — H&P (Signed)
 "  Ruel Kung , MD [address], [place], [place], [address] Phone: [phone] Fax: [phone]  Primary Care Physician:  Salli Amato, MD   Pre-Procedure History & Physical: HPI:  Brenda Campbell is a 74 y.o. female is here for an colonoscopy.   Past Medical History:  Diagnosis Date   A-fib (HCC) 03/26/2023   cardiac appt scheduled   Anxiety    Arthritis    Blood transfusion without reported diagnosis    Cancer (HCC)    small bowel ca   Depression    GERD (gastroesophageal reflux disease)    Hyperlipidemia    Hypertension    Mild mitral regurgitation by prior echocardiogram    Mild mitral regurgitation by prior echocardiogram    Mild tricuspid regurgitation by prior echocardiogram    Thyroid  disease     Past Surgical History:  Procedure Laterality Date   CARDIOVERSION N/A 06/25/2024   Procedure: CARDIOVERSION;  Surgeon: Wilburn Keller BROCKS, MD;  Location: [facility];  Service: Cardiovascular;  Laterality: N/A;   CARDIOVERSION N/A 08/13/2024   Procedure: CARDIOVERSION;  Surgeon: Wilburn Keller BROCKS, MD;  Location: [facility];  Service: Cardiovascular;  Laterality: N/A;   CATARACT EXTRACTION W/PHACO Right 03/25/2024   Procedure: PHACOEMULSIFICATION, CATARACT, WITH IOL INSERTION 13.25 01:12.0;  Surgeon: Myrna Adine Anes, MD;  Location: [facility];  Service: Ophthalmology;  Laterality: Right;   CATARACT EXTRACTION W/PHACO Left 04/08/2024   Procedure: PHACOEMULSIFICATION, CATARACT, WITH IOL INSERTION 7.43 00:52.5;  Surgeon: Myrna Adine Anes, MD;  Location: [facility];  Service: Ophthalmology;  Laterality: Left;   CESAREAN SECTION     x 1   CHOLECYSTECTOMY     COLONOSCOPY  10/12/2014   cleared for 3 years   COLONOSCOPY N/A 03/16/2017   Procedure: COLONOSCOPY;  Surgeon: Rogelia Copping, MD;  Location: [facility];  Service: Endoscopy;  Laterality: N/A;   COLONOSCOPY WITH PROPOFOL  N/A 12/01/2017   Procedure: COLONOSCOPY WITH PROPOFOL ;   Surgeon: Kung Ruel, MD;  Location: [facility] ENDOSCOPY;  Service: Gastroenterology;  Laterality: N/A;   COLONOSCOPY WITH PROPOFOL  N/A 04/12/2021   Procedure: COLONOSCOPY WITH PROPOFOL ;  Surgeon: Kung Ruel, MD;  Location: [facility];  Service: Gastroenterology;  Laterality: N/A;   ESOPHAGOGASTRODUODENOSCOPY (EGD) WITH PROPOFOL  N/A 03/15/2017   Procedure: ESOPHAGOGASTRODUODENOSCOPY (EGD) WITH PROPOFOL ;  Surgeon: Ruel Kung, MD;  Location: [facility] END[facility];  Service: Endoscopy;  Laterality: N/A;   ESOPHAGOGASTRODUODENOSCOPY (EGD) WITH PROPOFOL  N/A 03/30/2017   Procedure: ESOPHAGOGASTRODUODENOSCOPY (EGD) WITH PROPOFOL ;  Surgeon: Ruel Kung, MD;  Location: [facility] ENDOSCOPY;  Service: Endoscopy;  Laterality: N/A;  Endoscopic Capsule Placement   ESOPHAGOGASTRODUODENOSCOPY (EGD) WITH PROPOFOL  N/A 12/01/2017   Procedure: ESOPHAGOGASTRODUODENOSCOPY (EGD) WITH PROPOFOL ;  Surgeon: Kung Ruel, MD;  Location: [facility] ENDOS[facility];  Service: Gastroenterology;  Laterality: N/A;   ESOPHAGOGASTRODUODENOSCOPY (EGD) WITH PROPOFOL  N/A 04/12/2021   Procedure: ESOPHAGOGASTRODUODENOSCOPY (EGD) WITH PROPOFOL ;  Surgeon: Kung Ruel, MD;  Location: [facility] ENDOSCOPY;  Service: Gastroenterology;  Laterality: N/A;   FRACTURE SURGERY     GALLBLADDER SURGERY     GIVENS CAPSULE STUDY N/A 03/17/2017   Procedure: GIVENS CAPSULE STUDY;  Surgeon: Rogelia Copping, MD;  Location: [facility] ENDOS[facility];  Service: Endoscopy;  Laterality: N/A;   GIVENS CAPSULE STUDY N/A 12/01/2017   Procedure: GIVENS CAPSULE STUDY, 12 HOUR;  Surgeon: Kung Ruel, MD;  Location: [facility] ENDOSCOPY;  Service: Gastroenterology;  Laterality: N/A;   SMALL INTESTINE SURGERY     TEE WITHOUT CARDIOVERSION N/A 06/25/2024   Procedure: ECHOCARDIOGRAM, TRANSESOPHAGEAL;  Surgeon: Wilburn Keller BROCKS, MD;  Location:  [facility];  Service: Cardiovascular;  Laterality: N/A;   THROAT SURGERY     nodule on vocal chord   TIBIA FRACTURE SURGERY Right    WRIST SURGERY Right    arthritis    Prior  to Admission medications  Medication Sig Start Date End Date Taking? Authorizing Provider  amiodarone (PACERONE) 200 MG tablet Take 200 mg by mouth daily. 07/11/24 07/11/25  [provider]  apixaban (ELIQUIS) 5 MG TABS tablet Take 5 mg by mouth 2 (two) times daily.    [provider]  benzonatate  (TESSALON ) 100 MG capsule Take 1 capsule (100 mg total) by mouth 3 (three) times daily as needed for cough. 08/21/24   Bernardino Ditch, NP  carboxymethylcellulose (REFRESH PLUS) 0.5 % SOLN Place 1 drop into both eyes 3 (three) times daily as needed (dry/irritated eyes).    [provider]  cephALEXin  (KEFLEX ) 500 MG capsule Take 1 capsule (500 mg total) by mouth 2 (two) times daily. 12/16/24   Vivienne Delon HERO, PA-C  cetirizine (ZYRTEC) 10 MG tablet Take 10 mg by mouth daily as needed for allergies.    [provider]  clotrimazole -betamethasone  (LOTRISONE ) cream APPLY EXTERNALLY TWICE DAILY FOR 2 WKS Patient taking differently: Apply 1 Application topically daily as needed (itching/irritation). 05/10/24   Joshua Cathryne BROCKS, MD  conjugated estrogens  (PREMARIN ) vaginal cream Estrogen Cream Instruction Discard applicator Apply pea sized amount to tip of finger to urethra before bed. Wash hands well after application. Use Monday, Wednesday and Friday Patient not taking: Reported on 08/08/2024 05/17/24   Helon Kirsch A, PA-C  DULoxetine (CYMBALTA) 30 MG capsule Take 30 mg by mouth daily. 07/15/24   [provider]  estradiol  (ESTRACE  VAGINAL) 0.1 MG/GM vaginal cream Apply 0.5mg  (pea-sized amount)  just inside the vaginal introitus with a finger-tip on Monday, Wednesday and Friday nights. 05/17/24   Helon Kirsch A, PA-C  ipratropium (ATROVENT ) 0.06 % nasal spray Place 2 sprays into both nostrils 4 (four) times daily. 08/21/24   Bernardino Ditch, NP  levothyroxine  (SYNTHROID ) 50 MCG tablet Take 1 tablet (50 mcg total) by mouth daily. 02/27/24   Joshua Cathryne BROCKS, MD  magnesium  gluconate (MAGONATE) 500 MG tablet Take 500 mg by mouth daily.    [provider]  methenamine  (HIPREX ) 1 g tablet TAKE 1 TABLET (1 G TOTAL) BY MOUTH 2 (TWO) TIMES DAILY WITH A MEAL. Patient taking differently: Take 1 g by mouth daily. 07/22/24   Vaillancourt, Samantha, PA-C  metoprolol succinate (TOPROL-XL) 25 MG 24 hr tablet Take 25 mg by mouth daily as needed (tachycardia). 04/02/24 04/02/25  [provider]  omeprazole  (PRILOSEC) 20 MG capsule Take 1 capsule (20 mg total) by mouth 2 (two) times daily. Patient taking differently: Take 20 mg by mouth every other day. 06/10/24   Lemon Raisin, MD  phenazopyridine  (PYRIDIUM ) 200 MG tablet Take 1 tablet (200 mg total) by mouth 3 (three) times daily. 08/21/24   Bernardino Ditch, NP  Probiotic Product (PROBIOTIC DAILY) CAPS Take 1 capsule by mouth daily.    [provider]  promethazine -dextromethorphan (PROMETHAZINE -DM) 6.25-15 MG/5ML syrup Take 5 mLs by mouth 4 (four) times daily as needed. 08/21/24   Bernardino Ditch, NP  rosuvastatin  (CRESTOR ) 5 MG tablet Take 1 tablet (5 mg total) by mouth daily. 02/27/24   Joshua Cathryne BROCKS, MD    Allergies as of 10/08/2024 - Review Complete 08/21/2024  Allergen Reaction Noted   Aspirin  03/14/2024    Family History  Problem Relation Age of  Onset   Diabetes Mother    Heart disease Mother    Alzheimer's disease Mother    Other Father 79       old age   Diabetes Brother    Breast cancer Neg Hx     Social History   Socioeconomic History   Marital status: Married    Spouse name: Charles   Number of children: 1   Years of education: Not on file   Highest education level: Associate degree: occupational, scientist, product/process development, or vocational program  Occupational History   Occupation: retired  Tobacco Use   Smoking status: Former    Current packs/day: 0.00    Average packs/day: 1.5 packs/day for 27.0 years (40.5 ttl pk-yrs)    Types: Cigarettes    Start date: 65    Quit date: 1999    Years  since quitting: 27.0   Smokeless tobacco: Never  Vaping Use   Vaping status: Never Used  Substance and Sexual Activity   Alcohol use: Yes    Alcohol/week: 20.0 standard drinks of alcohol    Types: 20 Shots of liquor per week    Comment: 3-4  cocktails 6 days per week   Drug use: No   Sexual activity: Not Currently  Other Topics Concern   Not on file  Social History Narrative   Retired, but substitute teaches occasionally. Married with 1 child and 1 step-child   Social Drivers of Health   Tobacco Use: Medium Risk (12/25/2024)   Received from [facility]   Patient History    Smoking Tobacco Use: Former    Smokeless Tobacco Use: Never    Passive Exposure: Not on file  Financial Resource Strain: Low Risk  (09/30/2024)   Received from [facility]   Overall Financial Resource Strain (CARDIA)    Difficulty of Paying Living Expenses: Not hard at all  Food Insecurity: No Food Insecurity (09/30/2024)   Received from [facility]   Epic    Within the past 12 months, you worried that your food would run out before you got the money to buy more.: Never true    Within the past 12 months, the food you bought just didn't last and you didn't have money to get more.: Never true  Transportation Needs: No Transportation Needs (09/30/2024)   Received from [facility] - Transportation    In the past 12 months, has lack of transportation kept you from medical appointments or from getting medications?: No    Lack of Transportation (Non-Medical): No  Physical Activity: Insufficiently Active (02/26/2024)   Exercise Vital Sign    Days of Exercise per Week: 1 day    Minutes of Exercise per Session: 30 min  Stress: Stress Concern Present (02/26/2024)   [employer] - Occupational Stress Questionnaire    Feeling of Stress : To some extent  Social Connections: Moderately Integrated (02/26/2024)    Social Connection and Isolation Panel    Frequency of Communication with Friends and Family: More than three times a week    Frequency of Social Gatherings with Friends and Family: Once a week    Attends Religious Services: 1 to 4 times per year    Active Member of [employer] or Organizations: No    Attends Banker Meetings: Never    Marital Status: Married  Catering Manager Violence: Not At Risk (11/06/2023)   Humiliation, Afraid, Rape, and Kick questionnaire    Fear  of Current or Ex-Partner: No    Emotionally Abused: No    Physically Abused: No    Sexually Abused: No  Depression (PHQ2-9): Medium Risk (02/27/2024)   Depression (PHQ2-9)    PHQ-2 Score: 10  Alcohol Screen: Low Risk (02/26/2024)   Alcohol Screen    Last Alcohol Screening Score (AUDIT): 4  Housing: Low Risk  (09/30/2024)   Received from [facility]   Epic    In the last 12 months, was there a time when you were not able to pay the mortgage or rent on time?: No    In the past 12 months, how many times have you moved where you were living?: 0    At any time in the past 12 months, were you homeless or living in a shelter (including now)?: No  Utilities: Not At Risk (09/30/2024)   Received from [facility]   Epic    In the past 12 months has the electric, gas, oil, or water company threatened to shut off services in your home?: No  Health Literacy: Adequate Health Literacy (11/06/2023)   B1300 Health Literacy    Frequency of need for help with medical instructions: Never    Review of Systems: See HPI, otherwise negative ROS  Physical Exam: There were no vitals taken for this visit. General:   Alert,  pleasant and cooperative in NAD Head:  Normocephalic and atraumatic. Neck:  Supple; no masses or thyromegaly. Lungs:  Clear throughout to auscultation, normal respiratory effort.    Heart:  +S1, +S2, Regular rate and rhythm, No edema. Abdomen:  Soft, nontender and  nondistended. Normal bowel sounds, without guarding, and without rebound.   Neurologic:  Alert and  oriented x4;  grossly normal neurologically.  Impression/Plan: Brenda Campbell is here for an colonoscopy to be performed for surveillance due to prior history of colon polyps   Risks, benefits, limitations, and alternatives regarding  colonoscopy have been reviewed with the patient.  Questions have been answered.  All parties agreeable.   Ruel Kung, MD  01/01/2025, 8:50 AM  "

## 2025-01-01 NOTE — Anesthesia Postprocedure Evaluation (Signed)
"   Anesthesia Post Note  Patient: Brenda Campbell  Procedure(s) Performed: COLONOSCOPY POLYPECTOMY, INTESTINE CONTROL OF HEMORRHAGE, GI TRACT, ENDOSCOPIC, BY CLIPPING OR OVERSEWING  Patient location during evaluation: PACU Anesthesia Type: General Level of consciousness: awake and alert Pain management: satisfactory to patient Vital Signs Assessment: post-procedure vital signs reviewed and stable Respiratory status: spontaneous breathing Cardiovascular status: stable Anesthetic complications: no   No notable events documented.   Last Vitals:  Vitals:   01/01/25 1039 01/01/25 1040  BP:    Pulse:    Resp: 13 13  Temp:    SpO2:  98%    Last Pain:  Vitals:   01/01/25 1025  TempSrc: Temporal  PainSc:                  [name],[name]      "

## 2025-01-01 NOTE — Op Note (Signed)
 [facility] Gastroenterology Patient Name: Brenda Campbell Procedure Date: 01/01/2025 9:30 AM MRN: [id number] Account #: [id number] Date of Birth: Jun 06, 1951 Admit Type: Outpatient Age: 74 Room: [facility] ENDO ROOM 3 Gender: Female Note Status: Finalized Instrument Name: Colon Scope [id number] Procedure:             Colonoscopy Indications:           Surveillance: Personal history of adenomatous polyps                         on last colonoscopy > 3 years ago, Last colonoscopy:                         May 2022 Providers:             Ruel Kung MD, MD Referring MD:          No Local Md, MD (Referring MD) Medicines:             Monitored Anesthesia Care Complications:         No immediate complications. Procedure:             Pre-Anesthesia Assessment:                        - Prior to the procedure, a History and Physical was                         performed, and patient medications, allergies and                         sensitivities were reviewed. The patient's tolerance                         of previous anesthesia was reviewed.                        - The risks and benefits of the procedure and the                         sedation options and risks were discussed with the                         patient. All questions were answered and informed                         consent was obtained.                        - ASA Grade Assessment: II - A patient with mild                         systemic disease.                        After obtaining informed consent, the colonoscope was                         passed under direct vision. Throughout the procedure,                         the  patient's blood pressure, pulse, and oxygen                         saturations were monitored continuously. The was                         introduced through the anus and advanced to the the                         cecum, identified by the appendiceal orifice. The                          colonoscopy was performed with ease. The patient                         tolerated the procedure well. The quality of the bowel                         preparation was excellent. The ileocecal valve,                         appendiceal orifice, and rectum were photographed. Findings:      The perianal and digital rectal examinations were normal.      The perianal and digital rectal examinations were normal.      Four sessile polyps were found in the ascending colon. The polyps were 5       to 7 mm in size. These polyps were removed with a cold snare. Resection       and retrieval were complete. To prevent bleeding post-intervention, one       hemostatic clip was successfully placed. Clip manufacturer: [employer]. There was no bleeding at the end of the procedure.      A 20 mm polyp was found in the proximal ascending colon. The polyp was       Paris classification mixed IIc + IIa (mucosal depression with raised       edges). Preparations were made for mucosal resection. Demarcation of the       lesion was performed with high-definition white light to clearly       identify the boundaries of the lesion. Eleview was injected to raise the       lesion. Snare mucosal resection was performed. Resection and retrieval       were complete. Resected tissue margins were examined and clear of polyp       tissue. Fulguration to ablate the lesion remnants by snare was       successful. To prevent bleeding after mucosal resection, four hemostatic       clips were successfully placed. Clip manufacturer: [employer].       There was no bleeding at the end of the procedure.      Multiple medium-mouthed diverticula were found in the left colon.      The exam was otherwise without abnormality on direct and retroflexion       views. Impression:            - Four 5 to 7 mm polyps in the ascending colon,                         removed with a cold  snare. Resected and retrieved.                          Clip was placed. Clip manufacturer: [employer].                        - One 20 mm polyp in the proximal ascending colon,                         removed with mucosal resection. Resected and                         retrieved. Treated with a hot snare. Clips were                         placed. Clip manufacturer: [employer].                        - Diverticulosis in the left colon.                        - The examination was otherwise normal on direct and                         retroflexion views.                        - Mucosal resection was performed. Resection and                         retrieval were complete. Recommendation:        - Discharge patient to home (with escort).                        - Resume previous diet.                        - Continue present medications.                        - Await pathology results.                        - Repeat colonoscopy for surveillance based on                         pathology results. Procedure Code(s):     --- Professional ---                        [phone], Colonoscopy, flexible; with endoscopic mucosal                         resection                        45385, 59, Colonoscopy, flexible; with removal of                         tumor(s), polyp(s), or other lesion(s) by snare  technique Diagnosis Code(s):     --- Professional ---                        D12.2, Benign neoplasm of ascending colon                        Z86.010, Personal history of colonic polyps                        K57.30, Diverticulosis of large intestine without                         perforation or abscess without bleeding CPT copyright 2022 American Medical Association. All rights reserved. The codes documented in this report are preliminary and upon coder review may  be revised to meet current compliance requirements. Ruel Kung, MD Ruel Kung MD, MD 01/01/2025 10:13:33 AM This report has been signed  electronically. Number of Addenda: 0 Note Initiated On: 01/01/2025 9:30 AM Scope Withdrawal Time: 0 hours 17 minutes 26 seconds  Total Procedure Duration: 0 hours 27 minutes 0 seconds  Estimated Blood Loss:  Estimated blood loss: none.      [facility]

## 2025-01-01 NOTE — Transfer of Care (Signed)
 Immediate Anesthesia Transfer of Care Note  Patient: Brenda Campbell  Procedure(s) Performed: COLONOSCOPY POLYPECTOMY, INTESTINE CONTROL OF HEMORRHAGE, GI TRACT, ENDOSCOPIC, BY CLIPPING OR OVERSEWING  Patient Location: Endoscopy Unit  Anesthesia Type:General  Level of Consciousness: drowsy and patient cooperative  Airway & Oxygen Therapy: Patient Spontanous Breathing and Patient connected to face mask oxygen  Post-op Assessment: Report given to RN and Post -op Vital signs reviewed and stable  Post vital signs: Reviewed and stable  Last Vitals:  Vitals Value Taken Time  BP 90/60 01/01/25 10:15  Temp    Pulse 97 01/01/25 10:15  Resp 13 01/01/25 10:15  SpO2 99 % 01/01/25 10:15    Last Pain:  Vitals:   01/01/25 0859  TempSrc: Temporal  PainSc: 0-No pain         Complications: No notable events documented.

## 2025-01-01 NOTE — Anesthesia Procedure Notes (Addendum)
 Procedure Name: General with mask airway Date/Time: 01/01/2025 9:40 AM  Performed by: Ledora Duncan, CRNAPre-anesthesia Checklist: Patient identified, Emergency Drugs available, Suction available and Patient being monitored Patient Re-evaluated:Patient Re-evaluated prior to induction Oxygen Delivery Method: Simple face mask Induction Type: IV induction Placement Confirmation: positive ETCO2 and breath sounds checked- equal and bilateral Dental Injury: Teeth and Oropharynx as per pre-operative assessment

## 2025-01-01 NOTE — Anesthesia Preprocedure Evaluation (Signed)
 "                                  Anesthesia Evaluation  Patient identified by MRN, date of birth, ID band Patient awake    Reviewed: Allergy & Precautions, NPO status , Patient's Chart, lab work & pertinent test results  Airway Mallampati: III  TM Distance: >3 FB Neck ROM: full    Dental  (+) Teeth Intact   Pulmonary neg pulmonary ROS, Patient abstained from smoking., former smoker   Pulmonary exam normal  + decreased breath sounds      Cardiovascular Exercise Tolerance: Good hypertension, Pt. on medications negative cardio ROS Normal cardiovascular exam+ dysrhythmias Atrial Fibrillation  Rhythm:Irregular     Neuro/Psych   Anxiety     negative neurological ROS  negative psych ROS   GI/Hepatic negative GI ROS, Neg liver ROS,GERD  Medicated,,  Endo/Other  negative endocrine ROSHypothyroidism  Class 3 obesity  Renal/GU negative Renal ROS  negative genitourinary   Musculoskeletal  (+) Arthritis ,    Abdominal  (+) + obese  Peds negative pediatric ROS (+)  Hematology negative hematology ROS (+)   Anesthesia Other Findings Past Medical History: 03/26/2023: A-fib [facility])     Comment:  cardiac appt scheduled No date: Anxiety No date: Arthritis No date: Blood transfusion without reported diagnosis No date: Cancer [facility])     Comment:  small bowel ca No date: Depression No date: GERD (gastroesophageal reflux disease) No date: Hyperlipidemia No date: Hypertension No date: Mild mitral regurgitation by prior echocardiogram No date: Mild mitral regurgitation by prior echocardiogram No date: Mild tricuspid regurgitation by prior echocardiogram No date: Thyroid  disease  Past Surgical History: 06/25/2024: CARDIOVERSION; N/A     Comment:  Procedure: CARDIOVERSION;  Surgeon: Wilburn Keller BROCKS,               MD;  Location: [facility] ORS;  Service: Cardiovascular;                Laterality: N/A; 08/13/2024: CARDIOVERSION; N/A     Comment:  Procedure: CARDIOVERSION;   Surgeon: Wilburn Keller BROCKS,               MD;  Location: [facility] ORS;  Service: Cardiovascular;                Laterality: N/A; 03/25/2024: CATARACT EXTRACTION W/PHACO; Right     Comment:  Procedure: PHACOEMULSIFICATION, CATARACT, WITH IOL               INSERTION 13.25 01:12.0;  Surgeon: Myrna Adine Anes,               MD;  Location: [facility];  Service:               Ophthalmology;  Laterality: Right; 04/08/2024: CATARACT EXTRACTION W/PHACO; Left     Comment:  Procedure: PHACOEMULSIFICATION, CATARACT, WITH IOL               INSERTION 7.43 00:52.5;  Surgeon: Myrna Adine Anes, MD;              Location: [facility];  Service: Ophthalmology;                Laterality: Left; No date: CESAREAN SECTION     Comment:  x 1 No date: CHOLECYSTECTOMY 10/12/2014: COLONOSCOPY     Comment:  cleared for 3 years 03/16/2017: COLONOSCOPY; N/A     Comment:  Procedure: COLONOSCOPY;  Surgeon: Rogelia Copping, MD;                Location: [facility] ENDOSCOPY;  Service: Endoscopy;                Laterality: N/A; 12/01/2017: COLONOSCOPY WITH PROPOFOL ; N/A     Comment:  Procedure: COLONOSCOPY WITH PROPOFOL ;  Surgeon: Therisa Bi, MD;  Location: [facility] ENDOSCOPY;  Service:               Gastroenterology;  Laterality: N/A; 04/12/2021: COLONOSCOPY WITH PROPOFOL ; N/A     Comment:  Procedure: COLONOSCOPY WITH PROPOFOL ;  Surgeon: Therisa Bi, MD;  Location: [facility] ENDOSCOPY;  Service:               Gastroenterology;  Laterality: N/A; 03/15/2017: ESOPHAGOGASTRODUODENOSCOPY (EGD) WITH PROPOFOL ; N/A     Comment:  Procedure: ESOPHAGOGASTRODUODENOSCOPY (EGD) WITH               PROPOFOL ;  Surgeon: Bi Therisa, MD;  Location: [facility]               ENDOSCOPY;  Service: Endoscopy;  Laterality: N/A; 03/30/2017: ESOPHAGOGASTRODUODENOSCOPY (EGD) WITH PROPOFOL ; N/A     Comment:  Procedure: ESOPHAGOGASTRODUODENOSCOPY (EGD) WITH               PROPOFOL ;  Surgeon: Bi Therisa, MD;  Location: [facility]                ENDOSCOPY;  Service: Endoscopy;  Laterality: N/A;                Endoscopic Capsule Placement 12/01/2017: ESOPHAGOGASTRODUODENOSCOPY (EGD) WITH PROPOFOL ; N/A     Comment:  Procedure: ESOPHAGOGASTRODUODENOSCOPY (EGD) WITH               PROPOFOL ;  Surgeon: Therisa Bi, MD;  Location: [facility]               ENDOSCOPY;  Service: Gastroenterology;  Laterality: N/A; 04/12/2021: ESOPHAGOGASTRODUODENOSCOPY (EGD) WITH PROPOFOL ; N/A     Comment:  Procedure: ESOPHAGOGASTRODUODENOSCOPY (EGD) WITH               PROPOFOL ;  Surgeon: Therisa Bi, MD;  Location: [facility]               ENDOSCOPY;  Service: Gastroenterology;  Laterality: N/A; No date: FRACTURE SURGERY No date: GALLBLADDER SURGERY 03/17/2017: GIVENS CAPSULE STUDY; N/A     Comment:  Procedure: GIVENS CAPSULE STUDY;  Surgeon: Rogelia Copping,               MD;  Location: [facility] ENDOSCOPY;  Service: Endoscopy;                Laterality: N/A; 12/01/2017: GIVENS CAPSULE STUDY; N/A     Comment:  Procedure: GIVENS CAPSULE STUDY, 12 HOUR;  Surgeon:               Therisa Bi, MD;  Location: [facility] ENDOSCOPY;  Service:               Gastroenterology;  Laterality: N/A; No date: SMALL INTESTINE SURGERY 06/25/2024: TEE WITHOUT CARDIOVERSION; N/A     Comment:  Procedure: ECHOCARDIOGRAM, TRANSESOPHAGEAL;  Surgeon:               Alluri, Keller BROCKS, MD;  Location: [facility] ORS;  Service:  Cardiovascular;  Laterality: N/A; No date: THROAT SURGERY     Comment:  nodule on vocal chord No date: TIBIA FRACTURE SURGERY; Right No date: WRIST SURGERY; Right     Comment:  arthritis  BMI    Body Mass Index: 34.46 kg/m      Reproductive/Obstetrics negative OB ROS                              Anesthesia Physical Anesthesia Plan  ASA: 3  Anesthesia Plan: General   Post-op Pain Management:    Induction: Intravenous  PONV Risk Score and Plan: Propofol  infusion and TIVA  Airway Management Planned: Natural Airway and Nasal  Cannula  Additional Equipment:   Intra-op Plan:   Post-operative Plan:   Informed Consent: I have reviewed the patients History and Physical, chart, labs and discussed the procedure including the risks, benefits and alternatives for the proposed anesthesia with the patient or authorized representative who has indicated his/her understanding and acceptance.     Dental Advisory Given  Plan Discussed with: CRNA  Anesthesia Plan Comments:         Anesthesia Quick Evaluation  "

## 2025-01-02 LAB — SURGICAL PATHOLOGY

## 2025-01-03 ENCOUNTER — Ambulatory Visit: Payer: Self-pay | Admitting: Gastroenterology

## 2025-01-04 ENCOUNTER — Encounter: Payer: Self-pay | Admitting: Urology
